# Patient Record
Sex: Male | Born: 1945 | Race: White | Hispanic: No | Marital: Married
Health system: Southern US, Academic
[De-identification: ages and names within clinical notes are randomized; demographics above are authoritative.]

## PROBLEM LIST (undated history)

## (undated) ENCOUNTER — Encounter

## (undated) ENCOUNTER — Encounter: Attending: Hematology & Oncology | Primary: Hematology & Oncology

## (undated) ENCOUNTER — Telehealth

## (undated) ENCOUNTER — Telehealth: Attending: Oncology | Primary: Oncology

## (undated) ENCOUNTER — Ambulatory Visit

## (undated) ENCOUNTER — Ambulatory Visit: Payer: PRIVATE HEALTH INSURANCE

## (undated) ENCOUNTER — Non-Acute Institutional Stay: Payer: PRIVATE HEALTH INSURANCE

## (undated) ENCOUNTER — Encounter: Attending: Adult Health | Primary: Adult Health

## (undated) DIAGNOSIS — E785 Hyperlipidemia, unspecified: Secondary | ICD-10-CM

## (undated) DIAGNOSIS — Z87442 Personal history of urinary calculi: Secondary | ICD-10-CM

## (undated) DIAGNOSIS — I10 Essential (primary) hypertension: Secondary | ICD-10-CM

## (undated) DIAGNOSIS — K56609 Unspecified intestinal obstruction, unspecified as to partial versus complete obstruction: Secondary | ICD-10-CM

## (undated) DIAGNOSIS — H269 Unspecified cataract: Secondary | ICD-10-CM

## (undated) DIAGNOSIS — I2510 Atherosclerotic heart disease of native coronary artery without angina pectoris: Secondary | ICD-10-CM

## (undated) HISTORY — PX: CARDIAC CATHETERIZATION: SHX172

## (undated) HISTORY — PX: KIDNEY STONE SURGERY: SHX686

## (undated) HISTORY — DX: Atherosclerotic heart disease of native coronary artery without angina pectoris: I25.10

---
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
## 2001-08-30 ENCOUNTER — Inpatient Hospital Stay (HOSPITAL_COMMUNITY): Admission: EM | Admit: 2001-08-30 | Discharge: 2001-09-01 | Payer: Self-pay | Admitting: Cardiovascular Disease

## 2001-09-01 ENCOUNTER — Encounter: Payer: Self-pay | Admitting: Cardiovascular Disease

## 2002-03-30 ENCOUNTER — Encounter: Payer: Self-pay | Admitting: Family Medicine

## 2002-03-30 ENCOUNTER — Ambulatory Visit (HOSPITAL_COMMUNITY): Admission: RE | Admit: 2002-03-30 | Discharge: 2002-03-30 | Payer: Self-pay | Admitting: Family Medicine

## 2006-09-08 ENCOUNTER — Ambulatory Visit (HOSPITAL_COMMUNITY): Admission: RE | Admit: 2006-09-08 | Discharge: 2006-09-08 | Payer: Self-pay | Admitting: Urology

## 2006-09-14 ENCOUNTER — Ambulatory Visit (HOSPITAL_COMMUNITY): Admission: RE | Admit: 2006-09-14 | Discharge: 2006-09-14 | Payer: Self-pay | Admitting: Urology

## 2006-10-06 ENCOUNTER — Ambulatory Visit (HOSPITAL_COMMUNITY): Admission: RE | Admit: 2006-10-06 | Discharge: 2006-10-06 | Payer: Self-pay | Admitting: Urology

## 2006-10-19 ENCOUNTER — Ambulatory Visit (HOSPITAL_COMMUNITY): Admission: RE | Admit: 2006-10-19 | Discharge: 2006-10-19 | Payer: Self-pay | Admitting: Urology

## 2008-09-24 IMAGING — CR DG ABDOMEN 1V
1 series · 1 of 1 positions shown · non-contrast
Comparison: none

CLINICAL DATA: Right renal calculus.  Pain.  
 ABDOMEN - 1 VIEW:

[view not recorded]
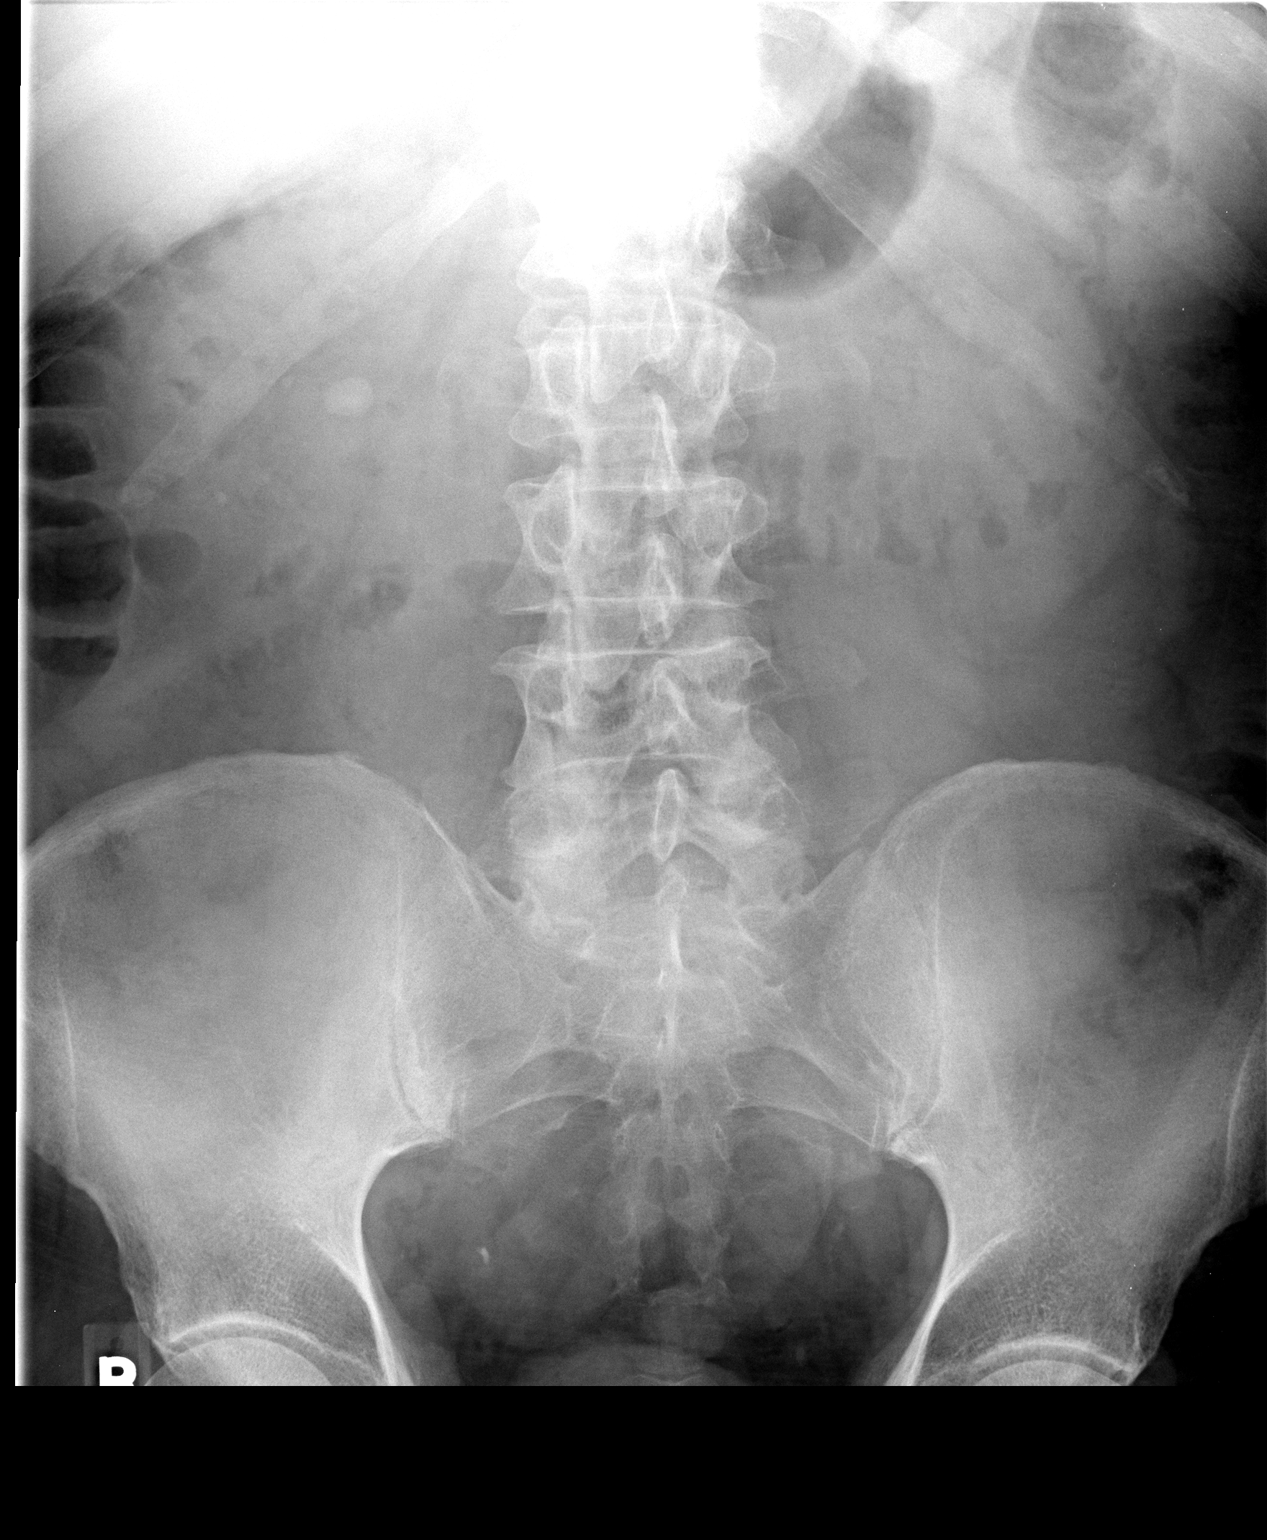

[1 of 1 positions shown; findings below may reference images not displayed]

FINDINGS: Approximately 12 x 14 mm calculus projecting in the region of medial aspect of the right kidney.  It may lie within the renal pelvis.  There appear to be a couple of smaller, approximately 2 x 3 mm calculi in the mid to inferior aspect of the right kidney.  No definite calculi along the distribution of the ureters.  Unremarkable bowel gas pattern.
IMPRESSION: Right renal calculi with the largest stone noted in the right renal hilar region.

## 2009-02-18 ENCOUNTER — Encounter: Payer: Self-pay | Admitting: Cardiology

## 2009-10-16 DIAGNOSIS — I2510 Atherosclerotic heart disease of native coronary artery without angina pectoris: Secondary | ICD-10-CM

## 2009-10-16 HISTORY — DX: Atherosclerotic heart disease of native coronary artery without angina pectoris: I25.10

## 2009-11-02 ENCOUNTER — Encounter: Payer: Self-pay | Admitting: Cardiology

## 2009-11-03 ENCOUNTER — Encounter: Payer: Self-pay | Admitting: Cardiology

## 2009-11-03 ENCOUNTER — Ambulatory Visit: Payer: Self-pay | Admitting: Internal Medicine

## 2009-11-03 ENCOUNTER — Inpatient Hospital Stay (HOSPITAL_COMMUNITY): Admission: EM | Admit: 2009-11-03 | Discharge: 2009-11-06 | Payer: Self-pay | Admitting: *Deleted

## 2009-11-06 ENCOUNTER — Encounter: Payer: Self-pay | Admitting: Cardiology

## 2009-11-25 ENCOUNTER — Ambulatory Visit: Payer: Self-pay | Admitting: Cardiology

## 2009-11-25 DIAGNOSIS — I2510 Atherosclerotic heart disease of native coronary artery without angina pectoris: Secondary | ICD-10-CM | POA: Insufficient documentation

## 2009-11-25 DIAGNOSIS — E785 Hyperlipidemia, unspecified: Secondary | ICD-10-CM

## 2009-11-25 DIAGNOSIS — E669 Obesity, unspecified: Secondary | ICD-10-CM | POA: Insufficient documentation

## 2009-11-29 ENCOUNTER — Telehealth (INDEPENDENT_AMBULATORY_CARE_PROVIDER_SITE_OTHER): Payer: Self-pay | Admitting: *Deleted

## 2009-12-06 ENCOUNTER — Telehealth (INDEPENDENT_AMBULATORY_CARE_PROVIDER_SITE_OTHER): Payer: Self-pay | Admitting: *Deleted

## 2009-12-13 ENCOUNTER — Telehealth (INDEPENDENT_AMBULATORY_CARE_PROVIDER_SITE_OTHER): Payer: Self-pay | Admitting: *Deleted

## 2009-12-13 ENCOUNTER — Encounter: Payer: Self-pay | Admitting: Cardiology

## 2009-12-18 ENCOUNTER — Encounter (INDEPENDENT_AMBULATORY_CARE_PROVIDER_SITE_OTHER): Payer: Self-pay | Admitting: *Deleted

## 2009-12-24 ENCOUNTER — Ambulatory Visit: Payer: Self-pay | Admitting: Cardiology

## 2009-12-24 ENCOUNTER — Encounter: Payer: Self-pay | Admitting: Physician Assistant

## 2010-01-13 ENCOUNTER — Encounter: Payer: Self-pay | Admitting: Cardiology

## 2010-01-20 ENCOUNTER — Encounter: Payer: Self-pay | Admitting: Cardiology

## 2010-02-24 ENCOUNTER — Encounter: Payer: Self-pay | Admitting: Cardiology

## 2010-03-03 ENCOUNTER — Encounter: Payer: Self-pay | Admitting: Cardiology

## 2010-05-16 ENCOUNTER — Ambulatory Visit: Payer: Self-pay | Admitting: Cardiology

## 2010-05-16 DIAGNOSIS — I714 Abdominal aortic aneurysm, without rupture: Secondary | ICD-10-CM | POA: Insufficient documentation

## 2010-11-16 HISTORY — PX: TOTAL KNEE ARTHROPLASTY: SHX125

## 2010-12-03 ENCOUNTER — Encounter: Payer: Self-pay | Admitting: Cardiology

## 2010-12-16 NOTE — Progress Notes (Signed)
Summary: LOW HR  Phone Note Call from Patient Call back at Home Phone [phone] Call back at [phone]   Caller: Spouse Summary of Call: HR 40 per BP machine. Patient denies being dizzy,SOB,lightheadedness,CP. Please advise. Initial call taken by: Carlye Grippe,  December 06, 2009 8:06 AM  Follow-up for Phone Call        please contact pt's wife and have her repeat HR, but this time document the BP, as well. And, is pt having symptoms with these vitals. Follow-up by: Nelida Meuse, PA-C,  December 06, 2009 2:22 PM  Additional Follow-up for Phone Call Additional follow up Details #1::        left message on voicemail to call office with the above info, home line busy times two. Additional Follow-up by: Carlye Grippe,  December 06, 2009 4:42 PM    Additional Follow-up for Phone Call Additional follow up Details #2::    BP- 110/47 HR-48. Patient c/o a little tired, SOB that said he's had the entire time. Follow-up by: Carlye Grippe,  December 09, 2009 11:18 AM  Additional Follow-up for Phone Call Additional follow up Details #3:: Details for Additional Follow-up Action Taken: instruct pt to decrease metoprolol to 12.5 mg daily (x1 week), then D/C altogether. check BP/HR afterwards, and contact our office with results/symptoms. will need to reassess whether or not pt can tolerate B blocker in future, at time of next OV.  12/10/09 @12 :48pm Patient's wife informed of the above.  Additional Follow-up by: Nelida Meuse, PA-C,  December 09, 2009 2:35 PM  New/Updated Medications: METOPROLOL TARTRATE 25 MG TABS (METOPROLOL TARTRATE) take 1/2 tab by mouth daily times 7days then stop

## 2010-12-16 NOTE — Progress Notes (Signed)
Summary: BP/HR questions  Phone Note Call from Patient Call back at Home Phone [phone]   Summary of Call: Pt's wife left message on voicemail stating pt was seen on Monday and she has some questions about his blood pressure and heart rate.   Attempted to reach pt but no answer. Left message to call back on machine. Initial call taken by: Cyril Loosen, RN, BSN,  November 29, 2009 3:53 PM  Follow-up for Phone Call        States one day last week got dizzy and BP got real low. His BP was 107/50 and HR was 54. The next time they checked it BP was 118/54 HR was 45. This am it was 125/69 HR 59. He thinks it has stabalized now, though. They will continue to monitor BP/HR and notify if dizziness returns or further problems. Follow-up by: Cyril Loosen, RN, BSN,  December 02, 2009 9:06 AM

## 2010-12-16 NOTE — Progress Notes (Signed)
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 NP stated that he had to have a stress test and that the echo is waved since he had the cath. NP request that we order the test.  Initial call taken by: Carlye Grippe,  December 13, 2009 1:21 PM  Follow-up for Phone Call        OK to order ETT. Follow-up by: Rollene Rotunda, MD, [facility],  December 16, 2009 1:52 PM

## 2010-12-16 NOTE — Assessment & Plan Note (Signed)
Summary: 6 mo fu per july reminder-srs   Visit Type:  Follow-up Primary Provider:  Dr. Reuel Boom  CC:  CAD.  History of Present Illness: The patient returns for followup of known coronary disease. Since I last saw him he has had no new cardiovascular problems. He works in his yard without limitations. He has had no chest pressure, neck or arm discomfort. He has had no palpitations, presyncope or syncope. He may get short of breath and very hot weather. However, he is not describing any resting shortness of breath, PND or orthopnea.  Preventive Screening-Counseling & Management  Alcohol-Tobacco     Smoking Status: quit     Year Quit: 2000  Current Medications (verified): 1)  Fish Oil 1000 Mg Caps (Omega-3 Fatty Acids) .... Take 1 Tablet By Mouth Once A Day 2)  Coq10 50 Mg Caps (Coenzyme Q10) .... Take 1 Tablet By Mouth Once A Day 3)  K-99 595 Mg Caps (Potassium Gluconate) .... Take 1 Tablet By Mouth Once A Day 4)  Lisinopril 10 Mg Tabs (Lisinopril) .... Take 1 Tablet By Mouth Once A Day 5)  Plavix 75 Mg Tabs (Clopidogrel Bisulfate) .... Take 1 Tablet By Mouth Once A Day 6)  Crestor 20 Mg Tabs (Rosuvastatin Calcium) .... Take 1 Tablet By Mouth Once A Day 7)  Nitrostat 0.4 Mg Subl (Nitroglycerin) .Marland Kitchen.. 1 Tablet Under Tongue At Onset of Chest Pain; You May Repeat Every 5 Minutes For Up To 3 Doses. 8)  Glucosamine Hcl 1000 Mg Tabs (Glucosamine Hcl) .... Take 2 Tablet By Mouth Once A Day 9)  Omeprazole 20 Mg Cpdr (Omeprazole) .... Take 1 Tablet By Mouth Once A Day 10)  Claritin 10 Mg Tabs (Loratadine) .... Take 1 Tablet By Mouth Once A Day 11)  Ibuprofen 200 Mg Tabs (Ibuprofen) .... As Needed  Allergies (verified): 1)  ! Iodine  Comments:  Nurse/Medical Assistant: The patient's medication bottles and allergies were reviewed with the patient and were updated in the Medication and Allergy Lists.  Past History:  Past Medical History: Reviewed history from 11/25/2009 and no changes  required. 1. CAD (NQWMI 90% stenosis in the right coronary artery and otherwise nonobstructive disease.  EF     55%.  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  The RCA was stented with a 4.5 x 16 mm VeriFLEX bare-    metal stent.) 2. Paroxysmal atrial fibrillation post percutaneous coronary     intervention. 3. Hypertension. 4. Hyperlipidemia. 5. History of bilateral renal calculi, status post extracorporeal     shockwave lithotripsy and lithotomy. 6. History abdominal aortic aneurysm measuring 4.4 x 5 cm. 7. Remote tobacco abuse. 8. History of IV CONTRAST allergy. 9. Renal insufficiency, likely related to contrast use this admission.  Past Surgical History: Kidney stones resected/lithotripsy  Review of Systems       As stated in the HPI and negative for all other systems.   Vital Signs:  Patient profile:   65 year old male Height:      73 inches Weight:      226 pounds O2 Sat:      97 % on Room air Pulse rate:   67 / minute BP sitting:   128 / 70  (left arm) Cuff size:   large  Vitals Entered By: Carlye Grippe (May 16, 2010 3:41 PM)  O2 Flow:  Room air  Physical Exam  General:  Well developed, well nourished,  in no acute distress. Head:  normocephalic and atraumatic Neck:  Neck supple, no JVD. No masses, thyromegaly or abnormal cervical nodes. Chest Wall:  no deformities or breast masses noted Lungs:  Clear bilaterally to auscultation and percussion. Heart:  Non-displaced PMI, chest non-tender; regular rate and rhythm, S1, S2 without murmurs, rubs or gallops. Carotid upstroke normal, no bruit. Normal abdominal aortic size, no bruits. Femorals normal pulses, no bruits. Pedals normal pulses. No edema, no varicosities. Abdomen:  Bowel sounds positive; abdomen soft and non-tender without masses, organomegaly, or hernias noted. No hepatosplenomegaly. Msk:  Back normal, normal gait. Muscle strength and tone  normal. Extremities:  No clubbing or cyanosis. Neurologic:  Alert and oriented x 3. Skin:  Intact without lesions or rashes. Cervical Nodes:  no significant adenopathy Inguinal Nodes:  no significant adenopathy Psych:  Normal affect.   Impression & Recommendations:  Problem # 1:  CORONARY ATHEROSCLEROSIS NATIVE CORONARY ARTERY (ICD-414.01) He is having no new symptoms and will continue with risk reduction.  Problem # 2:  ABDOMINAL AORTIC ANEURYSM (ICD-441.4) He had an abdominal aortic aneurysm checked recently and measuring 3.1 x 3.1. We will follow this up in one year.  Problem # 3:  HYPERLIPIDEMIA (ICD-272.4) he had lipids done in April and I reviewed these. His HDL was 35 and we discussed increase physical activity to treat this. The goal is an HDL greater than 40.  Patient Instructions: 1)  Your physician wants you to follow-up in: 1 year. You will receive a reminder letter in the mail one-two months in advance. If you don't receive a letter, please call our office to schedule the follow-up appointment. 2)  Your physician recommends that you continue on your current medications as directed. Please refer to the Current Medication list given to you today.

## 2010-12-16 NOTE — Letter (Signed)
Summary: DOT Request  DOT Request   Imported By: Cyril Loosen, RN, BSN 12/13/2009 14:58:07  _____________________________________________________________________  External Attachment:    Type:   Image     Comment:   External Document

## 2010-12-16 NOTE — Assessment & Plan Note (Signed)
Summary: new Hosp fu -d/c [facility] 12-22   Visit Type:  Follow-up Primary Provider:  Dr. Reuel Boom  CC:  CAD.  History of Present Illness: The patient presents for follow up after a non Q wave myocardial infarction.  Preventive Screening-Counseling & Management  Alcohol-Tobacco     Smoking Status: quit     Year Quit: 2000  Current Medications (verified): 1)  Fish Oil 1000 Mg Caps (Omega-3 Fatty Acids) .... Take 1 Tablet By Mouth Once A Day 2)  Coq10 100 Mg Caps (Coenzyme Q10) .... Take 1 Tablet By Mouth Once A Day 3)  K-99 595 Mg Caps (Potassium Gluconate) .... Take 1 Tablet By Mouth Once A Day 4)  Lisinopril 10 Mg Tabs (Lisinopril) .... Take 1 Tablet By Mouth Once A Day 5)  Plavix 75 Mg Tabs (Clopidogrel Bisulfate) .... Take 1 Tablet By Mouth Once A Day 6)  Metoprolol Tartrate 25 Mg Tabs (Metoprolol Tartrate) .... Take 1/2 Tab Two Times A Day 7)  Crestor 40 Mg Tabs (Rosuvastatin Calcium) .... Take 1 Tablet By Mouth Once A Day 8)  Nitrostat 0.4 Mg Subl (Nitroglycerin) .Marland Kitchen.. 1 Tablet Under Tongue At Onset of Chest Pain; You May Repeat Every 5 Minutes For Up To 3 Doses. 9)  Cefprozil 250 Mg Tabs (Cefprozil) .... Take 1 Tablet By Mouth Two Times A Day 10)  Benzonatate 100 Mg Caps (Benzonatate) .... As Needed 11)  Osteo Bi-Flex Triple Strength  Tabs (Misc Natural Products) .... Take 1 Tablet By Mouth Once A Day  Allergies (verified): 1)  ! Iodine  Past History:  Past Medical History: 1. CAD (NQWMI 90% stenosis in the right coronary artery and otherwise nonobstructive disease.  EF     55%.  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  The RCA was stented with a 4.5 x 16 mm VeriFLEX bare-    metal stent.) 2. Paroxysmal atrial fibrillation post percutaneous coronary     intervention. 3. Hypertension. 4. Hyperlipidemia. 5. History of bilateral renal calculi, status post extracorporeal     shockwave lithotripsy and lithotomy. 6. History  abdominal aortic aneurysm measuring 4.4 x 5 cm. 7. Remote tobacco abuse. 8. History of IV CONTRAST allergy. 9. Renal insufficiency, likely related to contrast use this admission.  Social History: Smoking Status:  quit  Review of Systems       As stated in the HPI and negative for all other systems.   Vital Signs:  Patient profile:   65 year old male Height:      73 inches Weight:      235.8 pounds BMI:     31.22 Pulse rate:   50 / minute BP sitting:   117 / 67  (left arm) Cuff size:   regular  Vitals Entered By: Dylan Brunette, LPN (November 25, 2009 2:59 PM)  Nutrition Counseling: Patient's BMI is greater than 25 and therefore counseled on weight management options. CC: CAD Is Patient Diabetic? No Comments post hosp.   Physical Exam  General:  Well developed, well nourished, in no acute distress. Head:  normocephalic and atraumatic Eyes:  PERRLA/EOM intact; conjunctiva and lids normal. Mouth:  Teeth, gums and palate normal. Oral mucosa normal. Neck:  Neck supple, no JVD. No masses, thyromegaly or abnormal cervical nodes. Chest Wall:  no deformities or breast masses noted Heart:  Non-displaced PMI, chest non-tender; regular rate and rhythm, S1, S2 without murmurs, rubs or gallops. Carotid upstroke normal, no bruit.  Normal abdominal aortic size, no bruits. Femorals normal pulses, no bruits. Pedals normal pulses. No edema, no varicosities. Abdomen:  Bowel sounds positive; abdomen soft and non-tender without masses, organomegaly, or hernias noted. No hepatosplenomegaly. Msk:  Back normal, normal gait. Muscle strength and tone normal. Extremities:  No clubbing or cyanosis. Neurologic:  Alert and oriented x 3. Skin:  Intact without lesions or rashes. Cervical Nodes:  no significant adenopathy Axillary Nodes:  no significant adenopathy Inguinal Nodes:  no significant adenopathy Psych:  Normal affect.   Impression & Recommendations:  Problem # 1:  CORONARY ATHEROSCLEROSIS  NATIVE CORONARY ARTERY (ICD-414.01) The the patient is having no new symptoms. No further cardiovascular testing is suggested. He will continue with risk reduction. Future Orders: T-Lipid Profile [id number]) ... 01/13/2010  Problem # 2:  HYPERLIPIDEMIA (ICD-272.4) I reviewed the lipids from the hospital. His LDL was slightly elevated. He is HDL was slightly low. He was sent home from the hospital on 40 mg of Crestor. In 6 weeks he will need a lipid profile and liver enzymes. I may be able to reduce his dose of Crestor based on these readings. Future Orders: T-Lipid Profile [id number]) ... 01/13/2010  Problem # 3:  OBESITY, UNSPECIFIED (ICD-278.00) He understands the need to lose weight with diet and exercise.  Patient Instructions: 1)  Your physician wants you to follow-up in: 6 MONTHS. You will receive a reminder letter in the mail about two months in advance. If you don't receive a letter, please call our office to schedule the follow-up appointment. 2)  Your physician recommends that you return for a FASTING lipid profile: in 6weeks at the [facility]. 3)  Your physician recommends that you continue on your current medications as directed. Please refer to the Current Medication list given to you today.   Orders Added: 1)  T-Lipid Profile [[id number]]

## 2010-12-16 NOTE — Miscellaneous (Signed)
Summary: LIPID/LIVER PROFILE  Clinical Lists Changes  Orders: Added new Test order of T-Hepatic Function [id number]) - Signed Added new Test order of T-Lipid Profile [id number]) - Signed

## 2010-12-16 NOTE — Letter (Signed)
Summary: Graded Exercise Tolerance Test  [facility] at [facility] [address]   [place], [place] [address]   Phone: [phone]  Fax: [phone]      [facility] Cardiovascular Services  Graded Exercise Tolerance Test    Dylan [name]  Appointment Date:_  Appointment Time:_   Your doctor has ordered a stress test to help determine the condition of your  heart during exercise. If you take blood pressure medicine , ask your doctor if you should take it the day of your test. You may eat a light meal before your test.  Please be sure to bring the copy of your order with you.   You should dress comfortably, for example: Sweat pants, shorts, or skirt, Loose                                                                                                       short sleeved T-shirt, Rubber soled lace-up shoes (tennis shoes)  You will need to arrive 15 minutes before your appointment time. You will also need to enter at the Main Entrance of the hospital and go to the registration desk. They will direct you to the Cardiovascular Department on the third floor.  You will need to plan on being at the hospital for one hour from registration for this appointment.

## 2010-12-18 NOTE — Letter (Signed)
Summary: Letter/ SURGICAL CLEARANCE [facility]  Letter/ SURGICAL CLEARANCE [facility]   Imported By: Dorise Hiss 12/03/2010 14:22:37  _____________________________________________________________________  External Attachment:    Type:   Image     Comment:   External Document  Appended Document: Letter/ SURGICAL CLEARANCE [facility] We will have him come back for a surgical clearance appt.  Appended Document: Letter/ SURGICAL CLEARANCE  [facility] Pt has pending appt for January 12, 2011 for surgical clearance. Surgery is scheduled for April 13. 2012.

## 2011-01-12 ENCOUNTER — Encounter: Payer: Self-pay | Admitting: Cardiology

## 2011-01-12 ENCOUNTER — Ambulatory Visit (INDEPENDENT_AMBULATORY_CARE_PROVIDER_SITE_OTHER): Payer: MEDICARE | Admitting: Cardiology

## 2011-01-12 DIAGNOSIS — I2510 Atherosclerotic heart disease of native coronary artery without angina pectoris: Secondary | ICD-10-CM

## 2011-01-22 NOTE — Assessment & Plan Note (Signed)
Summary: PAT NEEDS SURGICAL CLEARANCE FOR KNEE SURGERY, SCHE IN APRIL ...   Visit Type:  Follow-up Referring Provider:  Dr. Ollen Gross Primary Provider:  Dr. Reuel Boom  CC:  CAD.  History of Present Illness: The patient presents for routine followup. Since I last saw him he has had no new cardiovascular complaints. He is due to have knee replacement and is here for preoperative examination. He had his stent in December 2010. He had a stress echocardiogram in February 2011. He only got to 83% of his age-predicted maximum heart rate but there were no abnormalities on this study. He has since had none of the symptoms that prompted his catheterization. He still remains relatively active despite his needs. (Greater than 5 METS).  He has had chest pressure, neck or arm discomfort. There are no new palpitations, presyncope or syncope. He has had no weight gain or edema.  Preventive Screening-Counseling & Management  Alcohol-Tobacco     Smoking Status: quit     Year Quit: 10-12 yr ago  Current Medications (verified): 1)  Fish Oil 1000 Mg Caps (Omega-3 Fatty Acids) .... Take 1 Tablet By Mouth Once A Day 2)  Lisinopril 10 Mg Tabs (Lisinopril) .... Take 1 Tablet By Mouth Once A Day 3)  Plavix 75 Mg Tabs (Clopidogrel Bisulfate) .... Take 1 Tablet By Mouth Once A Day 4)  Crestor 20 Mg Tabs (Rosuvastatin Calcium) .... Take 1 Tablet By Mouth Once A Day 5)  Nitrostat 0.4 Mg Subl (Nitroglycerin) .Marland Kitchen.. 1 Tablet Under Tongue At Onset of Chest Pain; You May Repeat Every 5 Minutes For Up To 3 Doses. 6)  Glucosamine Hcl 1000 Mg Tabs (Glucosamine Hcl) .... Take 2 Tablet By Mouth Once A Day 7)  Ibuprofen 200 Mg Tabs (Ibuprofen) .... As Needed 8)  Aspir-Low 81 Mg Tbec (Aspirin) .... Take 1 Tablet By Mouth Once A Day  Allergies (verified): 1)  ! Iodine  Past History:  Past Medical History: 1. CAD (NQWMI 90% stenosis in the right coronary artery and otherwise nonobstructive disease.  EF     55%.  The left  main was anomalous from the right coronary cusp which was     previously known.  The RCA was stented with a 4.5 x 16 mm VeriFLEX bare-    metal stent. 12/10) 2. Paroxysmal atrial fibrillation post percutaneous coronary     intervention. 3. Hypertension. 4. Hyperlipidemia. 5. History of bilateral renal calculi, status post extracorporeal     shockwave lithotripsy and lithotomy. 6. History abdominal aortic aneurysm measuring 4.4 x 5 cm. 7. Remote tobacco abuse. 8. History of IV CONTRAST allergy. 9. Renal insufficiency, likely related to contrast use this admission.  Past Surgical History: Reviewed history from 05/16/2010 and no changes required. Kidney stones resected/lithotripsy  Review of Systems       As stated in the HPI and negative for all other systems.   Vital Signs:  Patient profile:   65 year old male Weight:      233.25 pounds BMI:     30.88 Pulse rate:   61 / minute BP sitting:   152 / 77  (right arm) Cuff size:   regular  Vitals Entered By: Hoover Brunette, LPN (January 12, 2011 11:01 AM) CC: CAD Is Patient Diabetic? No Comments cardiac clearance for left knee replacement scheduled for 4/13   Physical Exam  General:  Well developed, well nourished, in no acute distress. Head:  normocephalic and atraumatic Eyes:  PERRLA/EOM intact; conjunctiva and lids normal. Mouth:  Teeth, gums and palate normal. Oral mucosa normal. Neck:  Neck supple, no JVD. No masses, thyromegaly or abnormal cervical nodes. Chest Wall:  no deformities or breast masses noted Lungs:  Clear bilaterally to auscultation and percussion. Abdomen:  Bowel sounds positive; abdomen soft and non-tender without masses, organomegaly, or hernias noted. No hepatosplenomegaly. Msk:  Back normal, normal gait. Muscle strength and tone normal. Extremities:  No clubbing or cyanosis. Neurologic:  Alert and oriented x 3. Skin:  Intact without lesions or rashes. Cervical Nodes:  no significant  adenopathy Inguinal Nodes:  no significant adenopathy Psych:  Normal affect.   Detailed Cardiovascular Exam  Neck    Carotids: Carotids full and equal bilaterally without bruits.      Neck Veins: Normal, no JVD.    Heart    Inspection: no deformities or lifts noted.      Palpation: normal PMI with no thrills palpable.      Auscultation: regular rate and rhythm, S1, S2 without murmurs, rubs, gallops, or clicks.    Vascular    Abdominal Aorta: no palpable masses, pulsations, or audible bruits.      Femoral Pulses: normal femoral pulses bilaterally.      Pedal Pulses: normal pedal pulses bilaterally.      Radial Pulses: normal radial pulses bilaterally.      Peripheral Circulation: no clubbing, cyanosis, or edema noted with normal capillary refill.     EKG  Procedure date:  01/12/2011  Findings:      sinus bradycardia, rate 53, axis within normal, intervals within normal limits, no acute ST-T wave changes.  Impression & Recommendations:  Problem # 1:  PREOPERATIVE EXAMINATION (ICD-V72.84) According to the ACC/AHA guidelines the patient is at acceptable risk for the planned surgery. He can discontinue his Plavix prior to the surgery and hold his visual. I would've preferred that he continue aspirin in less this is absolutely contraindicated or ecchymosis there is a strong relative contraindication from a surgical standpoint. I have asked him to discuss this with Dr. Lequita Halt.  Problem # 2:  CORONARY ATHEROSCLEROSIS NATIVE CORONARY ARTERY (ICD-414.01) He has had no new symptoms since his stress test.  He will continue with the meds as listed. Orders: EKG w/ Interpretation ([id number])  Problem # 3:  ABDOMINAL AORTIC ANEURYSM (ICD-441.4) This is followed by his primary MD.    Problem # 4:  HYPERLIPIDEMIA (ICD-272.4) This is followed by his primary MD with a goal LDL less than 100 and HDL greater than 40.  Patient Instructions: 1)  Your physician wants you to follow-up in: 1 year.  You will receive a reminder letter in the mail one-two months in advance. If you don't receive a letter, please call our office to schedule the follow-up appointment. 2)  Your physician recommends that you continue on your current medications as directed. Please refer to the Current Medication list given to you today.

## 2011-01-22 NOTE — Medication Information (Signed)
Summary: [facility] D/C MEDICATION SHEET ORDER  [facility] D/C MEDICATION SHEET ORDER   Imported By: Zachary George 01/12/2011 08:56:57  _____________________________________________________________________  External Attachment:    Type:   Image     Comment:   External Document

## 2011-02-16 LAB — CBC
HCT: 34.9 % — ABNORMAL LOW (ref 39.0–52.0)
HCT: 35.8 % — ABNORMAL LOW (ref 39.0–52.0)
Hemoglobin: 12.4 g/dL — ABNORMAL LOW (ref 13.0–17.0)
Hemoglobin: 12.6 g/dL — ABNORMAL LOW (ref 13.0–17.0)
Hemoglobin: 14.2 g/dL (ref 13.0–17.0)
MCHC: 34.2 g/dL (ref 30.0–36.0)
MCHC: 34.7 g/dL (ref 30.0–36.0)
MCHC: 35.6 g/dL (ref 30.0–36.0)
MCV: 96.2 fL (ref 78.0–100.0)
MCV: 96.9 fL (ref 78.0–100.0)
Platelets: 189 10*3/uL (ref 150–400)
RBC: 3.72 MIL/uL — ABNORMAL LOW (ref 4.22–5.81)
RBC: 4.22 MIL/uL (ref 4.22–5.81)
RDW: 11.9 % (ref 11.5–15.5)
WBC: 14.6 10*3/uL — ABNORMAL HIGH (ref 4.0–10.5)
WBC: 19.7 10*3/uL — ABNORMAL HIGH (ref 4.0–10.5)
WBC: 8.1 10*3/uL (ref 4.0–10.5)

## 2011-02-16 LAB — BASIC METABOLIC PANEL
BUN: 30 mg/dL — ABNORMAL HIGH (ref 6–23)
CO2: 27 mEq/L (ref 19–32)
CO2: 27 mEq/L (ref 19–32)
Calcium: 9.2 mg/dL (ref 8.4–10.5)
Chloride: 108 mEq/L (ref 96–112)
GFR calc Af Amer: 52 mL/min — ABNORMAL LOW (ref >60)
GFR calc Af Amer: >60 mL/min (ref >60)
Glucose, Bld: 101 mg/dL — ABNORMAL HIGH (ref 70–99)
Potassium: 3.7 mEq/L (ref 3.5–5.1)
Potassium: 3.8 mEq/L (ref 3.5–5.1)
Sodium: 139 mEq/L (ref 135–145)
Sodium: 142 mEq/L (ref 135–145)

## 2011-02-16 LAB — COMPREHENSIVE METABOLIC PANEL
ALT: 23 U/L (ref 0–53)
AST: 56 U/L — ABNORMAL HIGH (ref 0–37)
Calcium: 8.8 mg/dL (ref 8.4–10.5)
GFR calc Af Amer: >60 mL/min (ref >60)
Sodium: 139 mEq/L (ref 135–145)
Total Protein: 6.3 g/dL (ref 6.0–8.3)

## 2011-02-16 LAB — CARDIAC PANEL(CRET KIN+CKTOT+MB+TROPI)
CK, MB: 37.3 ng/mL — ABNORMAL HIGH (ref 0.3–4.0)
CK, MB: 49.9 ng/mL — ABNORMAL HIGH (ref 0.3–4.0)
Total CK: 400 U/L — ABNORMAL HIGH (ref 7–232)
Total CK: 462 U/L — ABNORMAL HIGH (ref 7–232)
Total CK: 580 U/L — ABNORMAL HIGH (ref 7–232)
Troponin I: 10.19 ng/mL (ref 0.00–0.06)

## 2011-02-16 LAB — URINALYSIS, ROUTINE W REFLEX MICROSCOPIC
Bilirubin Urine: NEGATIVE
Glucose, UA: NEGATIVE mg/dL
Ketones, ur: NEGATIVE mg/dL
Specific Gravity, Urine: 1.019 (ref 1.005–1.030)
pH: 6.5 (ref 5.0–8.0)

## 2011-02-16 LAB — APTT: aPTT: 51 seconds — ABNORMAL HIGH (ref 24–37)

## 2011-02-16 LAB — LIPID PANEL
LDL Cholesterol: 95 mg/dL (ref 0–99)
Total CHOL/HDL Ratio: 4.8 RATIO
Triglycerides: 159 mg/dL — ABNORMAL HIGH (ref <150)
VLDL: 32 mg/dL (ref 0–40)

## 2011-02-16 LAB — DIFFERENTIAL
Basophils Relative: 1 % (ref 0–1)
Eosinophils Absolute: 0.6 10*3/uL (ref 0.0–0.7)
Lymphs Abs: 2.1 10*3/uL (ref 0.7–4.0)
Neutrophils Relative %: 60 % (ref 43–77)

## 2011-02-16 LAB — MAGNESIUM: Magnesium: 2 mg/dL (ref 1.5–2.5)

## 2011-02-16 LAB — HEPARIN LEVEL (UNFRACTIONATED)
Heparin Unfractionated: 0.25 IU/mL — ABNORMAL LOW (ref 0.30–0.70)
Heparin Unfractionated: 0.6 IU/mL (ref 0.30–0.70)

## 2011-02-18 ENCOUNTER — Other Ambulatory Visit: Payer: Self-pay | Admitting: Orthopedic Surgery

## 2011-02-18 ENCOUNTER — Ambulatory Visit (HOSPITAL_COMMUNITY)
Admission: RE | Admit: 2011-02-18 | Discharge: 2011-02-18 | Disposition: A | Discharge status: Home / Self Care | Payer: MEDICARE | Source: Ambulatory Visit | Attending: Orthopedic Surgery | Admitting: Orthopedic Surgery

## 2011-02-18 ENCOUNTER — Encounter (HOSPITAL_COMMUNITY): Payer: MEDICARE

## 2011-02-18 ENCOUNTER — Other Ambulatory Visit (HOSPITAL_COMMUNITY): Payer: Self-pay | Admitting: Orthopedic Surgery

## 2011-02-18 DIAGNOSIS — Z01818 Encounter for other preprocedural examination: Secondary | ICD-10-CM | POA: Insufficient documentation

## 2011-02-18 DIAGNOSIS — I519 Heart disease, unspecified: Secondary | ICD-10-CM | POA: Insufficient documentation

## 2011-02-18 DIAGNOSIS — Z01810 Encounter for preprocedural cardiovascular examination: Secondary | ICD-10-CM | POA: Insufficient documentation

## 2011-02-18 DIAGNOSIS — Z01812 Encounter for preprocedural laboratory examination: Secondary | ICD-10-CM | POA: Insufficient documentation

## 2011-02-18 LAB — COMPREHENSIVE METABOLIC PANEL
ALT: 20 U/L (ref 0–53)
AST: 23 U/L (ref 0–37)
Albumin: 3.9 g/dL (ref 3.5–5.2)
CO2: 29 mEq/L (ref 19–32)
Calcium: 9.1 mg/dL (ref 8.4–10.5)
Creatinine, Ser: 1.18 mg/dL (ref 0.4–1.5)
GFR calc Af Amer: >60 mL/min (ref >60)
Sodium: 142 mEq/L (ref 135–145)
Total Protein: 6.6 g/dL (ref 6.0–8.3)

## 2011-02-18 LAB — URINALYSIS, ROUTINE W REFLEX MICROSCOPIC
Glucose, UA: NEGATIVE mg/dL
Hgb urine dipstick: NEGATIVE
Protein, ur: NEGATIVE mg/dL
pH: 6 (ref 5.0–8.0)

## 2011-02-18 LAB — CBC
HCT: 40.5 % (ref 39.0–52.0)
Hemoglobin: 13.6 g/dL (ref 13.0–17.0)
MCHC: 33.6 g/dL (ref 30.0–36.0)
RBC: 4.29 MIL/uL (ref 4.22–5.81)

## 2011-02-18 LAB — APTT: aPTT: 30 seconds (ref 24–37)

## 2011-02-18 LAB — PROTIME-INR
INR: 1.03 (ref 0.00–1.49)
Prothrombin Time: 13.7 seconds (ref 11.6–15.2)

## 2011-02-27 ENCOUNTER — Inpatient Hospital Stay (HOSPITAL_COMMUNITY)
Admission: RE | Admit: 2011-02-27 | Discharge: 2011-03-02 | DRG: 470 | Disposition: A | Discharge status: Home Health | Payer: MEDICARE | Source: Ambulatory Visit | Attending: Orthopedic Surgery | Admitting: Orthopedic Surgery

## 2011-02-27 DIAGNOSIS — M1710 Unilateral primary osteoarthritis, unspecified knee: Principal | ICD-10-CM | POA: Diagnosis present

## 2011-02-27 DIAGNOSIS — Z87442 Personal history of urinary calculi: Secondary | ICD-10-CM

## 2011-02-27 DIAGNOSIS — I2510 Atherosclerotic heart disease of native coronary artery without angina pectoris: Secondary | ICD-10-CM | POA: Diagnosis present

## 2011-02-27 DIAGNOSIS — Z01812 Encounter for preprocedural laboratory examination: Secondary | ICD-10-CM

## 2011-02-27 DIAGNOSIS — I10 Essential (primary) hypertension: Secondary | ICD-10-CM | POA: Diagnosis present

## 2011-02-27 DIAGNOSIS — I252 Old myocardial infarction: Secondary | ICD-10-CM

## 2011-02-27 LAB — TYPE AND SCREEN
ABO/RH(D): B POS
Antibody Screen: NEGATIVE

## 2011-02-27 LAB — ABO/RH: ABO/RH(D): B POS

## 2011-02-28 LAB — BASIC METABOLIC PANEL WITH GFR
BUN: 14 mg/dL (ref 6–23)
CO2: 29 meq/L (ref 19–32)
Calcium: 8.3 mg/dL — ABNORMAL LOW (ref 8.4–10.5)
Chloride: 105 meq/L (ref 96–112)
Creatinine, Ser: 1.05 mg/dL (ref 0.4–1.5)
GFR calc non Af Amer: >60 mL/min
Glucose, Bld: 105 mg/dL — ABNORMAL HIGH (ref 70–99)
Potassium: 3.8 meq/L (ref 3.5–5.1)
Sodium: 138 meq/L (ref 135–145)

## 2011-02-28 LAB — CBC
HCT: 31 % — ABNORMAL LOW (ref 39.0–52.0)
Hemoglobin: 10.3 g/dL — ABNORMAL LOW (ref 13.0–17.0)
MCH: 31.4 pg (ref 26.0–34.0)
MCHC: 33.2 g/dL (ref 30.0–36.0)
MCV: 94.5 fL (ref 78.0–100.0)
Platelets: 166 10*3/uL (ref 150–400)
RBC: 3.28 MIL/uL — ABNORMAL LOW (ref 4.22–5.81)
RDW: 12.3 % (ref 11.5–15.5)
WBC: 9.2 10*3/uL (ref 4.0–10.5)

## 2011-03-01 LAB — BASIC METABOLIC PANEL
CO2: 30 mEq/L (ref 19–32)
Calcium: 8.5 mg/dL (ref 8.4–10.5)
Creatinine, Ser: 1.21 mg/dL (ref 0.4–1.5)
GFR calc Af Amer: >60 mL/min (ref >60)
Glucose, Bld: 128 mg/dL — ABNORMAL HIGH (ref 70–99)

## 2011-03-01 LAB — CBC
Hemoglobin: 9.7 g/dL — ABNORMAL LOW (ref 13.0–17.0)
MCH: 32 pg (ref 26.0–34.0)
MCHC: 33.9 g/dL (ref 30.0–36.0)

## 2011-03-02 LAB — CBC
MCH: 31.6 pg (ref 26.0–34.0)
MCHC: 33.6 g/dL (ref 30.0–36.0)
Platelets: 161 10*3/uL (ref 150–400)
RBC: 3.07 MIL/uL — ABNORMAL LOW (ref 4.22–5.81)
RDW: 12.5 % (ref 11.5–15.5)

## 2011-03-02 NOTE — H&P (Addendum)
NAME:  Dylan, [name] NO.:  [id number]  MEDICAL RECORD NO.:  [id number]           PATIENT TYPE:  I  LOCATION:  0006                         FACILITY:  [facility]  PHYSICIAN:  Ollen Gross, M.D.    DATE OF BIRTH:  02/10/1946  DATE OF ADMISSION:  02/27/2011 DATE OF DISCHARGE:                             HISTORY & PHYSICAL   CHIEF COMPLAINT:  Left knee pain.  BRIEF HISTORY:  Dylan [name] came in to see Dylan [name] back in January regarding his bilateral knee pain, worse on the left than the right.  He said he has had trouble with the left knee for many years, has gotten progressively worse over the last year.  He has had some trouble with the right knee, but the left is far worse.  He states that at this point, the pain and dysfunction are limiting what he is able to do and he now presents for a left total knee arthroplasty.  ALLERGIES:  Medication allergies are none.  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  PRIMARY CARE PHYSICIAN:  Donzetta Sprung.  CARDIOLOGIST:  Cecil Cranker, MD, [facility]  CURRENT MEDICATIONS: 1. Plavix. 2. Crestor. 3. Lisinopril. 4. Fish oil. 5. Glucosamine. 6. Aspirin. 7. Ibuprofen.  PAST MEDICAL HISTORY: 1. End-stage arthritis of the left knee. 2. Hypertension. 3. Coronary artery disease. 4. History of heart attack in December 2010. 5. History of kidney stones. 6. History of finger toes and nose fractures. 7. Arthritis.  PAST SURGICAL HISTORY:  Lithotripsy for kidney stones.  FAMILY HISTORY:  Father passed at the age of 51.  He had myocardial infarction.  Mother passed at the age of 78 from a car accident.  SOCIAL HISTORY:  The patient is married.  He is retired.  He admits to past use of tobacco products.  He does not use alcohol.  He states that he lives at home with his family.  He does plan to go home following his hospital stay.  REVIEW OF SYSTEMS:  GENERAL:  Negative for fevers, chills or weight change.   HEENT:  NEURO:  Negative for headache, blurred vision or double vision.  DERMATOLOGIC:  Negative for rash or lesion.  RESPIRATORY: Negative for shortness of breath at rest or with exertion. CARDIOVASCULAR:  Negative for chest pain.  Last EKG was in February 2012.  GI:  Negative for nausea, vomiting, diarrhea.  GU:  Negative for hematuria and dysuria.  MUSCULOSKELETAL:  Positive for joint pain.  PHYSICAL EXAMINATION:  VITAL SIGNS:  Pulse 76, respirations 18, blood pressure 138/70 in the left arm. GENERAL:  Dylan [name] is alert and oriented x3.  He is a pleasant 65 year old male.  He has a stated height of 61 inches and a stated weight of 230 pounds.  He is in no apparent distress. HEENT:  Normocephalic, atraumatic.  Extraocular movements intact.  The patient is wearing glasses. NECK:  Supple.  Full range of motion without lymphadenopathy. CHEST:  Lungs are clear to auscultation bilaterally without wheezes. HEART:  Regular rate and rhythm without murmur, S1 and S2 sounds are appreciated. ABDOMEN:  Bowel sounds present in  all 4 quadrants.  Abdomen is soft, nontender, nondistended. EXTREMITIES:  Left knee negative for effusion.  Range of motion is 10- 125 degrees.  There is marked crepitus throughout the range. SKIN:  Unremarkable. NEUROLOGIC:  Intact. PERIPHERAL VASCULAR:  Carotid pulses 2+ bilaterally without bruit.  RADIOGRAPHY:  AP and lateral views of the left knee reveal end-stage arthritis tricompartmentally.  IMPRESSION:  End-stage arthritis of the left knee.  PLAN:  Left total knee arthroplasty to be performed by Dylan [name].     Rozell Searing, PAC   ______________________________ Ollen Gross, M.D.    LD/MEDQ  D:  02/27/2011  T:  02/27/2011  Job:  [id number]  Electronically Signed by Rozell Searing  on 03/02/2011 09:52:32 AM Electronically Signed by Ollen Gross M.D. on 03/04/2011 09:53:00 AM

## 2011-03-04 NOTE — Op Note (Signed)
NAME:  Dylan [name], Dylan [name] NO.:  [id number]  MEDICAL RECORD NO.:  [id number]           PATIENT TYPE:  I  LOCATION:  0006                         FACILITY:  [facility]  PHYSICIAN:  Ollen Gross, M.D.    DATE OF BIRTH:  02-11-1946  DATE OF PROCEDURE: DATE OF DISCHARGE:                              OPERATIVE REPORT   PREOPERATIVE DIAGNOSIS:  Osteoarthritis, left knee.  POSTOPERATIVE DIAGNOSIS:  Osteoarthritis, left knee.  PROCEDURE:  Left total knee arthroplasty.  SURGEON:  Ollen Gross, M.D.  ASSISTANT:  Alexzandrew L. Perkins, P.A.C.  ANESTHESIA:  Spinal.  ESTIMATED BLOOD LOSS:  Minimal.  DRAINS:  Hemovac x1.  TOURNIQUET TIME:  37 minutes at 300 mmHg.  COMPLICATIONS:  None.  CONDITION:  Stable to recovery.  BRIEF CLINICAL NOTE:  Mr. Kutch is a 65 year old male with advanced end- stage arthritis of the left knee with progressively worsening pain and dysfunction.  He has failed nonoperative management and presents now for total knee arthroplasty.  PROCEDURE IN DETAIL:  After successful administration of spinal anesthetic, a tourniquet was placed high on the left thigh and left lower extremity prepped and draped in the usual sterile fashion. Extremity was wrapped in Esmarch, knee flexed, tourniquet inflated to 300 mmHg.  Midline incision was made with a #10 blade through subcutaneous tissue at the level of the extensor mechanism.  Fresh blade was used to make a medial parapatellar arthrotomy.  Soft tissue on the proximal medial tibia subperiosteally elevated to the joint line with the knife and into the semimembranosus bursa with a Cobb elevator.  Soft tissue laterally was elevated with attention being paid to avoid patellar tendon on tibial tubercle.  Patella was everted, knee flexed 90 degrees, and ACL and PCL removed.  Drill was used create a starting hole in the distal femur and the canal was thoroughly irrigated.  A 5 degrees of left valgus  alignment guide was placed and the distal femoral cutting block pinned to remove 11 mm off the distal femur.  Distal femoral resection was made with an oscillating saw.  Sizing guide was placed and size 5 was the most appropriate.  Rotation was marked at the epicondylar axis.  Size 5 cutting block was placed and the anterior-posterior chamfer cuts made.  The tibia subluxed forward and menisci removed.  Extramedullary tibial alignment guide was placed referencing proximally at the medial aspect of the tibial tubercle and distally along the second metatarsal axis and tibial crest.  Block was pinned to remove 2 mm off the more deficient medial side.  There was a significant medial defect.  The resection was little thicker than usual.  Resection was made with an oscillating saw. Osteophytes were removed.  Size 5 was the most appropriate tibial component.  The proximal tibia was prepared with modular drill and keel punch for the size 5.  Femoral preparation was completed with the intercondylar cut.  Size 5 mobile bearing tibial trial and size 5 posterior stabilized femoral trial and 12.5 mm posterior stabilized rotating platform insert trial were placed.  With the 12.5, there was a little bit of varus- valgus; placed with the  15 which allowed for full extension with excellent varus-valgus and anterior-posterior balance throughout full range of motion.  Patella was everted, thickness measured to be 27 mm. Freehand resection was taken to 15 mm, 41 template was placed, lug holes were drilled, trial patella was placed and it tracked normally. Osteophytes were removed off the posterior femur with the trial in place.  All trials were removed and the cut bone surfaces were prepared with pulsatile lavage.  Cement was mixed and once ready for implantation, a size 5 mobile bearing tibial tray, size 5 posterior stabilized femur and 41 patella were cemented into place and patella was held with a clamp.   Trial 15-mm inserts were placed, knee held in full extension and all extruded cement removed.  When the cement fully hardened, then the permanent 15-mm posterior stabilized rotating platform insert was placed in the tibial tray.  The wound was copiously irrigated with saline solution and the arthrotomy closed over Hemovac drain with interrupted #1 PDS.  Flexion against gravity to 135 degrees. The patella tracked normally.  Tourniquet was released after total time of 37 minutes.  Subcu was then closed with interrupted 2-0 Vicryl, subcuticular with running 4-0 Monocryl.  Catheter for Marcaine pain pump was placed and pump was initiated.  Drain was hooked to suction. Incisions cleaned and dried, and Steri-Strips and bulky sterile dressing applied.  He was then placed into a knee immobilizer, awakened and transported to recovery in stable condition.     Ollen Gross, M.D.     FA/MEDQ  D:  02/27/2011  T:  02/27/2011  Job:  [id number]  Electronically Signed by Ollen Gross M.D. on 03/04/2011 09:53:02 AM

## 2011-04-03 NOTE — H&P (Signed)
NAME:  Dylan [name], Dylan [name]               ACCOUNT NO.:  [id number]   MEDICAL RECORD NO.:  [id number]          PATIENT TYPE:  AMB   LOCATION:                                FACILITY:  [facility]   PHYSICIAN:  Ky Barban, M.D.DATE OF BIRTH:  1946-08-15   DATE OF ADMISSION:  DATE OF DISCHARGE:  LH                                HISTORY & PHYSICAL   CHIEF COMPLAINT:  Backache, gross hematuria.   HISTORY:  A 65 year old gentleman well known to me.  Several years ago he  had a kidney stone, underwent left ureterolithotomy by me and has done well.  I have not seen him until August 30, 2006, with gross hematuria history.  Hematuria has cleared up but then having microscopic hematuria.  A CT  urogram was done.  It shows that he has bilateral nephrolithiasis.  There is  a prominent calculus in the central renal pelvis on the right side and it  was non-obstructing but it is 1.4-cm in greatest diameter.  The other stones  are very small, mild aneurysmal dilation, dilatation of aorta, degenerative  changes of spine.  So, I have advised him to undergo a ESL for this right  renal calculus for which he is coming as an outpatient, will undergo the  procedure.  He was explained the procedure's complications, need for  additional procedures.  He understands.   PAST HISTORY:  1. Left ureterolithotomy, 1984.  2. He had a stone basket done by me in 2003.  3. No significant medical problem like diabetes or hypertension.   PERSONAL HISTORY:  He does not smoke or drink.   REVIEW OF SYSTEMS:  Denies any chest pain, orthopnea, PND, nausea, vomiting.   PHYSICAL EXAMINATION:  GENERAL:  Well nourished, well developed male not in  acute distress.  VITAL SIGNS:  Blood pressure 179/90, temperature is 99.  CENTRAL NERVOUS SYSTEM:  Negative.  HEENT:  Negative.  CHEST:  Symmetrical.  HEART:  Regular sinus rhythm.  ABDOMEN:  Soft, flat.  Liver, spleen, kidneys not palpable.  No CVA  tenderness.  EXTERNAL  GENITALIA:  He is circumcised.  Meatus is adequate.  Testicles are  normal.  RECTAL:  Sphincter __________  normal.  No rectal mass.  Prostate 1+,  smooth, and firm.   IMPRESSION:  1. Bilateral renal calculi, small.  2. A 1.4-cm right renal calculus.  3. Gross hematuria.  Urine cytology negative.   We will go ahead and do ESL for right renal calculus.      Ky Barban, M.D.  Electronically Signed     MIJ/MEDQ  D:  09/07/2006  T:  09/07/2006  Job:  [id number]

## 2011-04-03 NOTE — Discharge Summary (Signed)
[place]  Patient:    Dylan [name], Dylan [name] Visit Number: [id number] MRN: [id number]          Service Type: MED Location: [phone] Attending Physician:  Colon Branch Dictated by:   Dylan [name], P.A.C. Admit Date:  08/30/2001 Discharge Date: 09/01/2001   CC:         Dylan [name], M.D.  [facility] at [address]  [address], [place], [place] [address]   Discharge Summary  DATE OF BIRTH:  10/03/1946.  PROCEDURES: 1. Cardiac catheterization. 2. Coronary arteriogram. 3. Left ventriculogram. 4. Magnetic resonance imaging of the heart.  HOSPITAL COURSE:  [name] is a 65 year old male, with no known history of coronary artery disease, who was evaluated for week long history of chest discomfort that was sometimes sudden in onset and described as a tightness in his upper chest with radiation to his throat.  He also had some right shoulder discomfort with this.  His cardiac risk factors include family history of premature coronary artery disease, long-term tobacco use, and hyperlipidemia.  The patient was evaluated by Dr. Nona [name] and was not started on a beta blocker to an arresting heart rate of 50.  He was transferred to [facility] from [facility] for further evaluation and cardiac catheterization.  His enzymes were negative for MI.  His vitals were otherwise within normal limits, although he ran a temperature of 101.0 at one point.  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  The RCA had a 20% mid-distal stenosis and the LAD had a 20% proximal stenosis with a normal circumflex.  Dr. Noralyn [name]. Eden [name] could not be sure of the position of the LAD in comparison to the great arteries.  His left ventricular function was normal with an EF of approximately 60%.  Because of the anomalous coronary arteries, cardiac  MRI was ordered.  This was done on September 01, 2001.  The cardiac MRI did not show the LAD between the aorta and the pulmonary artery and the patient was stable with normal vital signs and a groin that was without hematoma or bruit.  His UA was negative and he was not anemic.  He was considered stable for discharge on September 01, 2001.  LABORATORY DATA:  Hemoglobin 14.1, hematocrit 40.6, wbcs 8.1, platelets 184,000.  Urinalysis was negative for nitrites and leukocytes with rare bacteria and 0 to 2 wbcs and rbcs per high powered field.  Urine culture was ordered and the results are pending.  CONDITION ON DISCHARGE:  Stable.  CONSULTANTS:  None.  COMPLICATIONS:  None.  DISCHARGE DIAGNOSES: 1. Chest pain, no obstructive coronary artery disease by cardiac    catheterization. 2. 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 3. Preserved left ventricular function. 4. Arthritis. 5. Family history of premature coronary artery disease. 6. Hyperlipidemia. 7. Ongoing tobacco use. 8. History of palpitations.  DISCHARGE INSTRUCTIONS:  His activity level is to include no driving, sexual or strenuous activity for two days.  He is to call the office for bleeding, swelling, or drainage at the catheter site.  He has a follow-up appointment with Dr. [name] on September 14, 2001 at 10 a.m.  He is to follow up with Dr. Donzetta [name] as well.  He is not to smoke.  DISCHARGE MEDICATIONS: 1. Aspirin 325 mg  q.d. 2. Lipitor 10 mg q.d. 3. Protonix 40 mg q.d. Dictated by:   Dylan [name], P.A.C. Attending Physician:  Colon Branch DD:  09/01/01 TD:  09/01/01 Job: [id number]

## 2011-04-03 NOTE — H&P (Signed)
NAME:  Dylan [name], Dylan [name]               ACCOUNT NO.:  [id number]   MEDICAL RECORD NO.:  [id number]          PATIENT TYPE:  AMB   LOCATION:  DAY                           FACILITY:  [facility]   PHYSICIAN:  Ky Barban, M.D.DATE OF BIRTH:  07-Dec-1945   DATE OF ADMISSION:  DATE OF DISCHARGE:  LH                              HISTORY & PHYSICAL   CHIEF COMPLAINT:  Right renal calculus.   HISTORY:  A 65 year old male who has a stone in the right kidney,  pelvis, and he is having some pain on that side.  ESL was done for  stone.  Part of it came out, but most of the stone is still there.  He  is being brought as an outpatient to undergo ESL for this right renal  calculus.   PROCEDURE:  The limitations, complication, use of additional procedure  is explained.   PAST MEDICAL HISTORY:  He had a left ureteral lithotomy done in 1984.  He also had a stone basket done in 2003.  No significant __________  problems.   PERSONAL HISTORY:  He does not smoke or drink.   REVIEW OF SYSTEMS:  He denies any chest pain, orthopnea, PND, nausea,  vomiting.   PHYSICAL EXAMINATION:  VITAL SIGNS:  Blood pressure 179/80.  Temperature  is normal.  CENTRAL NERVOUS SYSTEM:  No gross neurologic deficit.  HEAD/NECK/EENT negative.  CHEST:  Symmetrical.  HEART:  Regular sinus rhythm.  No murmur.  ABDOMEN:  Soft, flat.  Liver, spleen, kidneys are not palpable.  EXTERNAL GENITALIA:  Unremarkable.  RECTAL:  Deferred.  EXTREMITIES:  Normal.   IMPRESSION:  Right renal calculus.   PLAN:  ESL, right renal calculus.      Ky Barban, M.D.  Electronically Signed    MIJ/MEDQ  D:  10/05/2006  T:  10/05/2006  Job:  [id number]

## 2011-04-03 NOTE — Cardiovascular Report (Signed)
[place]. [facility]  Patient:    Dylan [name], Dylan [name] Visit Number: [id number] MRN: [id number]          Service Type: MED Location: [phone] Attending Physician:  Colon Branch Dictated by:   Noralyn Pick [place] Emms, M.D. [facility] Proc. Date: 08/31/01 Admit Date:  08/30/2001   CC:         Jonelle Sidle, M.D. [facility]             Donzetta Sprung, M.D.             [facility]                        Cardiac Catheterization  PROCEDURE PERFORMED: Coronary arteriography.  INDICATIONS: Recurrent chest pain.  Coronary risk factors.  DESCRIPTION OF PROCEDURE: Coronary arteriography was done from the right femoral artery. After initial engagement of the JL4 catheter, it became evident that there were no coronary ostia coming from the left coronary cusp. We initially injected the right coronary artery which was a large, dominant vessel. There were 20% multiple discrete lesions in the proximal and midportion.  An aortic root injection was done to further assess anomalous coronary arteries. The initial LAO aortic root injection showed an anomalous LAD circumflex coming off inferiorly from the right coronary ostia. Both a multipurpose and a JR4 catheter were able to be maneuvered to point downward into this anomalous vessel. The anomalous vessel clearly supplied the LAD and circumflex territory. There was 20-30% discrete stenosis at the takeoff of the LAD but no other critical disease in these arteries.  The initial bend of the anomaly would suggest that it was not going between the aortopulmonary window. However, this could not be 100% documented using angiographic techniques.  RIGHT ANTERIOR OBLIQUE VENTRICULOGRAPHY: RAO ventriculography revealed normal wall motion.  Ejection fraction 60%. There was no evidence of MR. Aortic pressure was 160/70. LV pressure was 165/15.  IMPRESSION: The patients chest pain would appear not to be from coronary disease.  He  does have a lingula infiltrate and had a fever this morning.  PLAN: He will have a followup chest x-ray and UA to further assess this. Given that he has anomalous coronary arteries, we will do a cardiac MRI to make sure that the LAD dose not course between the aortopulmonary window. However, I do not think his clinical representation was from coronary disease and he just happened to have a very rare cardiac coronary anomaly. Dictated by:   Noralyn Pick Eden Emms, M.D. [facility] Attending Physician:  Colon Branch DD:  08/31/01 TD:  09/01/01 Job: [id number]

## 2011-04-08 NOTE — Discharge Summary (Signed)
NAME:  Dylan [name]               ACCOUNT NO.:  [id number]  MEDICAL RECORD NO.:  [id number]           PATIENT TYPE:  I  LOCATION:  1616                         FACILITY:  [facility]  PHYSICIAN:  Ollen Gross, M.D.    DATE OF BIRTH:  Aug 02, 1946  DATE OF ADMISSION:  02/27/2011 DATE OF DISCHARGE:  03/02/2011                              DISCHARGE SUMMARY   ADMITTING DIAGNOSES: 1. Osteoarthritis of the left knee. 2. Hypertension. 3. Coronary arterial disease. 4. History of myocardial infarction in December 2010. 5. History of renal calculi. 6. History of finger, toes and nose fractures. 7. Osteoarthritis.  DISCHARGE DIAGNOSES: 1. Osteoarthritis of the left knee status post left total-knee     replacement arthroplasty. 2. Mild postoperative acute blood loss anemia that did not require     transfusion. 3. Hypertension. 4. Coronary arterial disease. 5. History of myocardial infarction in December 2010. 6. History of renal calculi. 7. History of finger, toes and nose fractures. 8. Osteoarthritis.  PROCEDURE:  February 27, 2011, left total-knee.  SURGEON:  Ollen Gross, M.D.  ASSISTANT:  Alexzandrew L. Perkins, P.A.C.  ANESTHESIA:  Surgery was performed under spinal anesthesia.  TOURNIQUET TIME:  37 minutes.  CONSULTATIONS:  None.  BRIEF HISTORY:  [name] is a 65 year old male with advanced arthritis of the left knee with progressive worsening pain and dysfunction.  He has failed nonoperative management and now presents for a total-knee arthroplasty.  LABORATORY DATA:  Preoperative CBC is not scanned into the chart, but the serial CBCs were followed for 3 days.  Hemoglobin dropped down to 10.3 and then 9.7 where it stabilized.  Last noted H and H was 9.7 and 28.9.  I do not have the admission Chemistry panel.  It was not scanned into the chart, but BMETs were normal for 48 hours.  Blood group type B positive.  HOSPITAL COURSE:  The patient was admitted to [facility], taken to the OR, and underwent the above-stated procedure without complication.  The patient tolerated the procedure well and was later transferred to the recovery room on the orthopedic floor.  He did have a little bit of nausea on the evening of surgery and the morning of day #1 that did improve throughout the day.  He was placed on Xarelto for DVT prophylaxis.  He was initially placed on a PCA, but that was discontinued on day #1 and weaned over to p.o. medications and had IV backup.  He started getting up out of bed and by day #2 he was feeling better.  Pain was under better control.  He was getting up and walking with therapy.  Hemoglobin was down to 9.7.  Dressing was changed and the incision looked good.  He continued to progress well and by day #3 he was meeting his goals and tolerating his medications.  His hemoglobin was stable and he was discharged home.  DISCHARGE PLAN:  The patient was discharged home on March 02, 2011.  DISCHARGE DIAGNOSES:  Please see above.  DISCHARGE MEDICATIONS:  Xarelto, Robaxin, Percocet.  Continue aspirin, lisinopril, nitroglycerin and Crestor.  DIET:  Heart-healthy diet.  ACTIVITY:  Weightbearing as tolerated.  Total-knee protocol.  Home health PT for therapy.  FOLLOWUP:  Follow up in 2 weeks.  DISPOSITION:  Home.  CONDITION ON DISCHARGE:  Improved.     Alexzandrew L. Julien Girt, P.A.C.   ______________________________ Ollen Gross, M.D.    ALP/MEDQ  D:  03/19/2011  T:  03/19/2011  Job:  [id number]  Electronically Signed by Patrica Duel P.A.C. on 04/02/2011 10:33:57 AM Electronically Signed by Ollen Gross M.D. on 04/08/2011 12:52:17 PM

## 2011-05-06 ENCOUNTER — Other Ambulatory Visit: Payer: Self-pay | Admitting: Cardiology

## 2011-07-01 ENCOUNTER — Telehealth: Payer: Self-pay | Admitting: *Deleted

## 2011-07-01 NOTE — Telephone Encounter (Signed)
Pt states he started working out at the [facility] following knee surgery. He states he really gives out of breath while working out. He rides a bike a lot and his breath just gives out. He states he doesn't have any problem with SOB when doing activities at home. He states he has had the SOB for about 3 months now. He states SOB is [facility] and that it just doesn't seem like he is building up any endurance. Pt is requesting appt. Pt given first available appt in [facility] with Dr. Antoine Poche, which is for Sept 17th. Offered to contact [facility] to inquire if they will have sooner appt there or in [place]. Pt declined and stated he would take appt in [place].

## 2011-07-07 DIAGNOSIS — R072 Precordial pain: Secondary | ICD-10-CM

## 2011-07-14 NOTE — Telephone Encounter (Signed)
Pt called back and s/w Lucendia Herrlich. He cancelled appt for 9/17 stating he saw his primary MD for SOB.

## 2011-08-03 ENCOUNTER — Ambulatory Visit: Payer: Medicare Other | Admitting: Cardiology

## 2011-08-05 ENCOUNTER — Other Ambulatory Visit: Payer: Self-pay | Admitting: Cardiovascular Disease

## 2011-08-11 ENCOUNTER — Other Ambulatory Visit: Payer: Self-pay | Admitting: Cardiovascular Disease

## 2011-12-22 ENCOUNTER — Other Ambulatory Visit: Payer: Self-pay | Admitting: Cardiovascular Disease

## 2012-05-08 ENCOUNTER — Other Ambulatory Visit: Payer: Self-pay | Admitting: Cardiology

## 2012-05-09 NOTE — Telephone Encounter (Signed)
..   Requested Prescriptions   Pending Prescriptions Disp Refills  . CRESTOR 20 MG tablet [Pharmacy Med Name: CRESTOR 20MG         TAB] 30 each 2    Sig: TAKE ONE TABLET BY MOUTH EVERY DAY  Patient needs to call office to make appointmnet

## 2012-06-13 ENCOUNTER — Emergency Department (HOSPITAL_COMMUNITY): Payer: Medicare Other

## 2012-06-13 ENCOUNTER — Encounter (HOSPITAL_COMMUNITY): Payer: Self-pay | Admitting: Emergency Medicine

## 2012-06-13 ENCOUNTER — Inpatient Hospital Stay (HOSPITAL_COMMUNITY)
Admission: EM | Admit: 2012-06-13 | Discharge: 2012-06-15 | DRG: 390 | Disposition: A | Discharge status: Home / Self Care | Payer: Medicare Other | Attending: Internal Medicine | Admitting: Internal Medicine

## 2012-06-13 DIAGNOSIS — Z91041 Radiographic dye allergy status: Secondary | ICD-10-CM

## 2012-06-13 DIAGNOSIS — K5900 Constipation, unspecified: Secondary | ICD-10-CM | POA: Diagnosis present

## 2012-06-13 DIAGNOSIS — I10 Essential (primary) hypertension: Secondary | ICD-10-CM | POA: Diagnosis present

## 2012-06-13 DIAGNOSIS — Z91013 Allergy to seafood: Secondary | ICD-10-CM

## 2012-06-13 DIAGNOSIS — Z79899 Other long term (current) drug therapy: Secondary | ICD-10-CM

## 2012-06-13 DIAGNOSIS — E785 Hyperlipidemia, unspecified: Secondary | ICD-10-CM | POA: Diagnosis present

## 2012-06-13 DIAGNOSIS — Z6829 Body mass index (BMI) 29.0-29.9, adult: Secondary | ICD-10-CM

## 2012-06-13 DIAGNOSIS — I2510 Atherosclerotic heart disease of native coronary artery without angina pectoris: Secondary | ICD-10-CM | POA: Diagnosis present

## 2012-06-13 DIAGNOSIS — I252 Old myocardial infarction: Secondary | ICD-10-CM

## 2012-06-13 DIAGNOSIS — K56609 Unspecified intestinal obstruction, unspecified as to partial versus complete obstruction: Principal | ICD-10-CM | POA: Diagnosis present

## 2012-06-13 DIAGNOSIS — Z7982 Long term (current) use of aspirin: Secondary | ICD-10-CM

## 2012-06-13 DIAGNOSIS — Z87442 Personal history of urinary calculi: Secondary | ICD-10-CM

## 2012-06-13 DIAGNOSIS — Z87891 Personal history of nicotine dependence: Secondary | ICD-10-CM

## 2012-06-13 DIAGNOSIS — Z9861 Coronary angioplasty status: Secondary | ICD-10-CM

## 2012-06-13 DIAGNOSIS — E669 Obesity, unspecified: Secondary | ICD-10-CM | POA: Diagnosis present

## 2012-06-13 HISTORY — DX: Essential (primary) hypertension: I10

## 2012-06-13 HISTORY — DX: Hyperlipidemia, unspecified: E78.5

## 2012-06-13 LAB — CBC WITH DIFFERENTIAL/PLATELET
Eosinophils Absolute: 0 10*3/uL (ref 0.0–0.7)
Eosinophils Relative: 0 % (ref 0–5)
HCT: 46.5 % (ref 39.0–52.0)
Hemoglobin: 15.8 g/dL (ref 13.0–17.0)
Lymphs Abs: 0.7 10*3/uL (ref 0.7–4.0)
MCH: 31.9 pg (ref 26.0–34.0)
MCV: 93.8 fL (ref 78.0–100.0)
Monocytes Relative: 5 % (ref 3–12)
RBC: 4.96 MIL/uL (ref 4.22–5.81)

## 2012-06-13 LAB — LIPASE, BLOOD: Lipase: 30 U/L (ref 11–59)

## 2012-06-13 LAB — COMPREHENSIVE METABOLIC PANEL
Alkaline Phosphatase: 75 U/L (ref 39–117)
BUN: 20 mg/dL (ref 6–23)
Calcium: 10.1 mg/dL (ref 8.4–10.5)
GFR calc Af Amer: 73 mL/min — ABNORMAL LOW (ref >90)
GFR calc non Af Amer: 63 mL/min — ABNORMAL LOW (ref >90)
Glucose, Bld: 122 mg/dL — ABNORMAL HIGH (ref 70–99)
Potassium: 4.1 mEq/L (ref 3.5–5.1)
Total Protein: 7.9 g/dL (ref 6.0–8.3)

## 2012-06-13 MED ORDER — HYDROMORPHONE HCL PF 1 MG/ML IJ SOLN
1.0000 mg | Freq: Once | INTRAMUSCULAR | Status: AC
  Administered 2012-06-14: 1 mg via INTRAVENOUS
  Filled 2012-06-13: qty 1

## 2012-06-13 MED ORDER — HYDROMORPHONE HCL PF 1 MG/ML IJ SOLN
1.0000 mg | Freq: Once | INTRAMUSCULAR | Status: AC
  Administered 2012-06-13: 1 mg via INTRAVENOUS
  Filled 2012-06-13: qty 1

## 2012-06-13 MED ORDER — ONDANSETRON HCL 4 MG/2ML IJ SOLN
4.0000 mg | Freq: Once | INTRAMUSCULAR | Status: AC
  Administered 2012-06-13: 4 mg via INTRAVENOUS
  Filled 2012-06-13: qty 2

## 2012-06-13 MED ORDER — SODIUM CHLORIDE 0.9 % IV SOLN
Freq: Once | INTRAVENOUS | Status: AC
  Administered 2012-06-13: 22:00:00 via INTRAVENOUS

## 2012-06-13 MED ORDER — IOHEXOL 300 MG/ML  SOLN
100.0000 mL | Freq: Once | INTRAMUSCULAR | Status: AC | PRN
  Administered 2012-06-13: 100 mL via INTRAVENOUS

## 2012-06-13 NOTE — ED Notes (Addendum)
Patient complaining of abdominal pain, constipation, and vomiting since this morning. States "I ate too much and now I can't get rid of it."

## 2012-06-13 NOTE — ED Notes (Signed)
Patient vomiting yellow fluid

## 2012-06-13 NOTE — ED Provider Notes (Signed)
History   This chart was scribed for Dylan Booze, MD by [name]. The patient was seen in room [facility] and the patient's care was started at 10:17PM.    CSN: [id number]  Arrival date & time 06/13/12  2119   First MD Initiated Contact with Patient 06/13/12 2214      Chief Complaint  Patient presents with  . Constipation  . Emesis  . Abdominal Pain    (Consider location/radiation/quality/duration/timing/severity/associated sxs/prior treatment) The history is provided by the patient. No language interpreter was used.   Dylan [name] is a 66 y.o. male who presents to the Emergency Department complaining of persistent, moderate to severe abdomainal pain with associated constipation and emesis with an onset tonight. Pt was eating when the symptoms began. Nml BMs present yesterday. Vomiting or passing gas did not alleviate the pain. Pt had a physical 2 weeks ago and states that PCP said he was fine. Pt rates the severity of the pain 6/10, states it hurts worse on the left-sided abd, and has been getting progressively worse. No HA, fever, neck pain, sore throat, rash, back pain, CP, SOB, diarrhea, dysuria, or extremity pain, edema, weakness, numbness, or tingling. No known allergies to medications. No other pertinent medical symptoms.  PCP: Dr. Garner Nash in [place]  Past Medical History  Diagnosis Date  . Acute MI   . Kidney stone   . Hypertension     Past Surgical History  Procedure Date  . Kidney surgery   . Stents   . Cardiac surgery   . Total knee arthroplasty     History reviewed. No pertinent family history.  History  Substance Use Topics  . Smoking status: Never Smoker   . Smokeless tobacco: Not on file  . Alcohol Use: No      Review of Systems 10 Systems reviewed and all are negative for acute change except as noted in the HPI.   Allergies  Fish allergy; Iodine; and Shrimp  Home Medications   Current Outpatient Rx  Name Route Sig Dispense Refill    . ASPIRIN EC 81 MG PO TBEC Oral Take 81 mg by mouth every morning.    Marland Kitchen CRESTOR 20 MG PO TABS  TAKE ONE TABLET BY MOUTH EVERY DAY 30 each 2    Patient needs to call office to make appointmnet  . GLUCOSAMINE HCL PO Oral Take 2,000 mg by mouth every morning.    . IBUPROFEN 200 MG PO TABS Oral Take 1,200 mg by mouth daily.    Marland Kitchen LISINOPRIL 10 MG PO TABS  TAKE ONE TABLET BY MOUTH EVERY DAY 30 tablet 11  . PANTOPRAZOLE SODIUM 40 MG PO TBEC Oral Take 40 mg by mouth every morning.    Marland Kitchen PLAVIX 75 MG PO TABS  TAKE ONE TABLET BY MOUTH EVERY DAY WITH FOOD 30 each 12  . POTASSIUM GLUCONATE PO Oral Take 1 tablet by mouth every morning.    Marland Kitchen NITROGLYCERIN 0.4 MG SL SUBL  DISSOLVE ONE TABLET UNDER THE TONGUE EVERY 5 MINUTES AS NEEDED FOR CHEST PAIN.  DO NOT EXCEED A TOTAL OF 3 DOSES IN 15 MINUTES 25 tablet 3    Dispense as written.    BP 127/75  Pulse 93  Temp 97.8 F (36.6 C) (Oral)  Resp 20  Ht 6\' 2"  (1.88 m)  Wt 238 lb (107.956 kg)  BMI 30.56 kg/m2  SpO2 95%  Physical Exam  Nursing note and vitals reviewed. Constitutional: He is oriented to person, place, and time.  He appears well-developed and well-nourished. No distress.  HENT:  Head: Normocephalic and atraumatic.  Right Ear: External ear normal.  Left Ear: External ear normal.  Eyes: EOM are normal.  Neck: Normal range of motion. Neck supple. No tracheal deviation present.  Cardiovascular: Normal rate, regular rhythm and normal heart sounds.  Exam reveals no gallop and no friction rub.   No murmur heard. Pulmonary/Chest: Effort normal. No respiratory distress. He has no wheezes.  Abdominal: Soft. He exhibits distension (mild). There is tenderness (moderate diffuse tenderness). There is rebound (+/- rebound tenderness). There is no guarding.       Decreased bowel sounds  Musculoskeletal: Normal range of motion. He exhibits no tenderness.  Neurological: He is alert and oriented to person, place, and time.  Skin: Skin is warm and dry. No  rash noted.  Psychiatric: He has a normal mood and affect. His behavior is normal.    ED Course  Procedures (including critical care time)  DIAGNOSTIC STUDIES: Oxygen Saturation is 95% on room air, adequate by my interpretation.    COORDINATION OF CARE:  10:24PM - EDMD will order dilaudid, IV fluids, Zofran, abd CT, blood w/u, and UA for the pt.  Results for orders placed during the hospital encounter of 06/13/12  CBC WITH DIFFERENTIAL      Component Value Range   WBC 7.9  4.0 - 10.5 K/uL   RBC 4.96  4.22 - 5.81 MIL/uL   Hemoglobin 15.8  13.0 - 17.0 g/dL   HCT 16.1  09.6 - 04.5 %   MCV 93.8  78.0 - 100.0 fL   MCH 31.9  26.0 - 34.0 pg   MCHC 34.0  30.0 - 36.0 g/dL   RDW 40.9  81.1 - 91.4 %   Platelets 167  150 - 400 K/uL   Neutrophils Relative 86 (*) 43 - 77 %   Neutro Abs 6.8  1.7 - 7.7 K/uL   Lymphocytes Relative 9 (*) 12 - 46 %   Lymphs Abs 0.7  0.7 - 4.0 K/uL   Monocytes Relative 5  3 - 12 %   Monocytes Absolute 0.4  0.1 - 1.0 K/uL   Eosinophils Relative 0  0 - 5 %   Eosinophils Absolute 0.0  0.0 - 0.7 K/uL   Basophils Relative 0  0 - 1 %   Basophils Absolute 0.0  0.0 - 0.1 K/uL  COMPREHENSIVE METABOLIC PANEL      Component Value Range   Sodium 142  135 - 145 mEq/L   Potassium 4.1  3.5 - 5.1 mEq/L   Chloride 102  96 - 112 mEq/L   CO2 29  19 - 32 mEq/L   Glucose, Bld 122 (*) 70 - 99 mg/dL   BUN 20  6 - 23 mg/dL   Creatinine, Ser 7.82  0.50 - 1.35 mg/dL   Calcium 95.6  8.4 - 21.3 mg/dL   Total Protein 7.9  6.0 - 8.3 g/dL   Albumin 4.3  3.5 - 5.2 g/dL   AST 21  0 - 37 U/L   ALT 16  0 - 53 U/L   Alkaline Phosphatase 75  39 - 117 U/L   Total Bilirubin 0.9  0.3 - 1.2 mg/dL   GFR calc non Af Amer 63 (*) >90 mL/min   GFR calc Af Amer 73 (*) >90 mL/min  LIPASE, BLOOD      Component Value Range   Lipase 30  11 - 59 U/L  URINALYSIS, ROUTINE W REFLEX MICROSCOPIC  Component Value Range   Color, Urine AMBER (*) YELLOW   APPearance CLEAR  CLEAR   Specific Gravity,  Urine >1.030 (*) 1.005 - 1.030   pH 6.0  5.0 - 8.0   Glucose, UA NEGATIVE  NEGATIVE mg/dL   Hgb urine dipstick NEGATIVE  NEGATIVE   Bilirubin Urine NEGATIVE  NEGATIVE   Ketones, ur TRACE (*) NEGATIVE mg/dL   Protein, ur TRACE (*) NEGATIVE mg/dL   Urobilinogen, UA 0.2  0.0 - 1.0 mg/dL   Nitrite NEGATIVE  NEGATIVE   Leukocytes, UA NEGATIVE  NEGATIVE  URINE MICROSCOPIC-ADD ON      Component Value Range   Squamous Epithelial / LPF RARE  RARE   WBC, UA 3-6  <3 WBC/hpf   RBC / HPF 3-6  <3 RBC/hpf   Bacteria, UA FEW (*) RARE    Ct Abdomen Pelvis W Contrast  06/14/2012  *RADIOLOGY REPORT*  Clinical Data: Constipation.  Emesis.  Abdominal pain.  History of stones, hypertension, MI, cardiac stents  CT ABDOMEN AND PELVIS WITH CONTRAST  Technique:  Multidetector CT imaging of the abdomen and pelvis was performed following the standard protocol during bolus administration of intravenous contrast.  Contrast: OMNIPAQUE IOHEXOL 300 MG/ML  SOLN  Comparison: Ultrasound 02/24/2010, CT 09/02/2007  Findings: The lung bases are unremarkable in appearance.  The heart is enlarged.  There are coronary artery calcifications and coronary stents.  No focal abnormality identified within the liver, spleen, pancreas, or adrenal glands.  Low attenuation lesions are identified within the kidneys, most consistent with cysts.  Bilateral intrarenal calculi identified.  No ureteral stones are present.  The stomach has a normal appearance.  Multiple dilated small bowel loops are identified.  The distal small bowel loops are normal in caliber however.  Transition zone is identified within a right lower quadrant small bowel loop, best seen on image number 70.  There is free pelvic fluid.  Multiple colonic diverticula are identified.  No CT evidence for acute diverticulitis however. The appendix is well seen and has a normal appearance.   Small infrarenal abdominal aortic aneurysm is 3.1 cm in diameter.  There are mild degenerative  changes in the lumbar spine.  IMPRESSION:  1.  Small bowel obstruction, best localized to the right lower quadrant. 2.  Free pelvic fluid. 3.  Bilateral renal cysts and intrarenal calculi.  Original Report Authenticated By: Patterson Hammersmith, M.D.     1. Small bowel obstruction       MDM  Abdominal pain with generalized tenderness. This clearly is not due to constipation. He will need CT scan for further evaluation.  He got good pain relief with hydromorphone and good relief of nausea with ondansetron. I reviewed the CT and appears to show a small bowel obstruction although official report is pending. He will need to be admitted.  Radiologist interpretation of CT scan agrees with small bowel obstruction. Consultation will be obtained with hospitalist and he will also need a surgical consultation since he does not have prior abdominal surgery.  I personally performed the services described in this documentation, which was scribed in my presence. The recorded information has been reviewed and considered.       Dylan Booze, MD 06/14/12 Jacinta Shoe

## 2012-06-14 ENCOUNTER — Encounter (HOSPITAL_COMMUNITY): Payer: Self-pay | Admitting: Internal Medicine

## 2012-06-14 ENCOUNTER — Inpatient Hospital Stay (HOSPITAL_COMMUNITY): Payer: Medicare Other

## 2012-06-14 DIAGNOSIS — I10 Essential (primary) hypertension: Secondary | ICD-10-CM | POA: Diagnosis present

## 2012-06-14 DIAGNOSIS — E669 Obesity, unspecified: Secondary | ICD-10-CM

## 2012-06-14 DIAGNOSIS — K56609 Unspecified intestinal obstruction, unspecified as to partial versus complete obstruction: Principal | ICD-10-CM

## 2012-06-14 DIAGNOSIS — I2510 Atherosclerotic heart disease of native coronary artery without angina pectoris: Secondary | ICD-10-CM

## 2012-06-14 DIAGNOSIS — E785 Hyperlipidemia, unspecified: Secondary | ICD-10-CM

## 2012-06-14 LAB — COMPREHENSIVE METABOLIC PANEL
AST: 16 U/L (ref 0–37)
Albumin: 3.6 g/dL (ref 3.5–5.2)
BUN: 20 mg/dL (ref 6–23)
Calcium: 9.4 mg/dL (ref 8.4–10.5)
Creatinine, Ser: 1.14 mg/dL (ref 0.50–1.35)
GFR calc non Af Amer: 65 mL/min — ABNORMAL LOW (ref >90)

## 2012-06-14 LAB — URINALYSIS, ROUTINE W REFLEX MICROSCOPIC
Bilirubin Urine: NEGATIVE
Nitrite: NEGATIVE
Specific Gravity, Urine: >1.03 — ABNORMAL HIGH (ref 1.005–1.030)
Urobilinogen, UA: 0.2 mg/dL (ref 0.0–1.0)

## 2012-06-14 LAB — CBC
MCH: 31.8 pg (ref 26.0–34.0)
MCV: 94.4 fL (ref 78.0–100.0)
Platelets: 132 10*3/uL — ABNORMAL LOW (ref 150–400)
RDW: 12.6 % (ref 11.5–15.5)

## 2012-06-14 LAB — MRSA PCR SCREENING: MRSA by PCR: NEGATIVE

## 2012-06-14 LAB — URINE MICROSCOPIC-ADD ON

## 2012-06-14 MED ORDER — SODIUM CHLORIDE 0.9 % IJ SOLN
INTRAMUSCULAR | Status: AC
  Administered 2012-06-14: 10 mL
  Filled 2012-06-14: qty 3

## 2012-06-14 MED ORDER — SODIUM CHLORIDE 0.9 % IJ SOLN
INTRAMUSCULAR | Status: AC
  Filled 2012-06-14: qty 3

## 2012-06-14 MED ORDER — METOCLOPRAMIDE HCL 5 MG/ML IJ SOLN
10.0000 mg | Freq: Once | INTRAMUSCULAR | Status: AC
  Administered 2012-06-14: 10 mg via INTRAVENOUS
  Filled 2012-06-14: qty 2

## 2012-06-14 MED ORDER — FAMOTIDINE IN NACL 20-0.9 MG/50ML-% IV SOLN
20.0000 mg | Freq: Once | INTRAVENOUS | Status: AC
  Administered 2012-06-14: 20 mg via INTRAVENOUS
  Filled 2012-06-14: qty 50

## 2012-06-14 MED ORDER — FAMOTIDINE IN NACL 20-0.9 MG/50ML-% IV SOLN
INTRAVENOUS | Status: AC
  Filled 2012-06-14: qty 50

## 2012-06-14 MED ORDER — POTASSIUM CHLORIDE IN NACL 20-0.9 MEQ/L-% IV SOLN
INTRAVENOUS | Status: DC
  Administered 2012-06-14 (×3): via INTRAVENOUS

## 2012-06-14 MED ORDER — HYDRALAZINE HCL 20 MG/ML IJ SOLN: 10.0000 mg | INTRAMUSCULAR | Status: DC | PRN

## 2012-06-14 MED ORDER — PANTOPRAZOLE SODIUM 40 MG IV SOLR
40.0000 mg | INTRAVENOUS | Status: DC
  Administered 2012-06-14 – 2012-06-15 (×2): 40 mg via INTRAVENOUS
  Filled 2012-06-14 (×2): qty 40

## 2012-06-14 MED ORDER — ONDANSETRON HCL 4 MG/2ML IJ SOLN: 4.0000 mg | INTRAMUSCULAR | Status: DC | PRN

## 2012-06-14 MED ORDER — HYDROMORPHONE HCL PF 1 MG/ML IJ SOLN
0.5000 mg | INTRAMUSCULAR | Status: DC | PRN
  Administered 2012-06-14: 0.5 mg via INTRAVENOUS
  Filled 2012-06-14: qty 1

## 2012-06-14 MED ORDER — ACETAMINOPHEN 650 MG RE SUPP: 650.0000 mg | RECTAL | Status: DC | PRN

## 2012-06-14 MED ORDER — ENOXAPARIN SODIUM 40 MG/0.4ML ~~LOC~~ SOLN
40.0000 mg | SUBCUTANEOUS | Status: DC
  Administered 2012-06-14: 40 mg via SUBCUTANEOUS
  Filled 2012-06-14: qty 0.4

## 2012-06-14 MED ORDER — TRAZODONE HCL 50 MG PO TABS: 25.0000 mg | ORAL_TABLET | Freq: Every evening | ORAL | Status: DC | PRN

## 2012-06-14 MED ORDER — DIPHENHYDRAMINE HCL 50 MG/ML IJ SOLN
25.0000 mg | Freq: Once | INTRAMUSCULAR | Status: AC
  Administered 2012-06-14: 25 mg via INTRAVENOUS
  Filled 2012-06-14: qty 1

## 2012-06-14 NOTE — Consult Note (Signed)
Reason for Consult:Small Bowel Obstruction  Referring Physician: [facility]  Dylan [name] is an 66 y.o. male.  HPI: Patient presented to [facility] last night with increasing nausea and diffuse abdominal pain.  No similar symptoms in the past.  Last BM was a couple days ago.  No melena.  No hematochezia.  No fever or chills.  No hematemesis. "Feels like I over stuffed myself".  No prior abdominal surgery.  + renal surgery.  Has noted a darkening of his urine.  Pt's wife states she thought he had hematuria last night.  Patient has had a BM this AM.  Symptoms are much better than last night.   Past Medical History  Diagnosis Date  . 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 Kidney stone   . Hypertension   . Hyperlipemia     Past Surgical History  Procedure Date  . Kidney surgery   . Stents   . Coronary angioplasty with stent placement 10/2009  . Total knee arthroplasty     History reviewed. No pertinent family history.  Social History:  reports that he quit smoking about 10 years ago. He does not have any smokeless tobacco history on file. He reports that he does not drink alcohol or use illicit drugs.  Allergies:  Allergies  Allergen Reactions  . Fish Allergy Nausea And Vomiting  . Iodine Nausea And Vomiting  . Shrimp (Shellfish Allergy) Nausea And Vomiting    Medications:  I have reviewed the patient's current medications. Prior to Admission:  Prescriptions prior to admission  Medication Sig Dispense Refill  . aspirin EC 81 MG tablet Take 81 mg by mouth every morning.      Marland Kitchen CRESTOR 20 MG tablet TAKE ONE TABLET BY MOUTH EVERY DAY  30 each  2  . GLUCOSAMINE HCL PO Take 2,000 mg by mouth every morning.      Marland Kitchen ibuprofen (ADVIL,MOTRIN) 200 MG tablet Take 1,200 mg by mouth daily.      Marland Kitchen lisinopril (PRINIVIL,ZESTRIL) 10 MG tablet TAKE ONE TABLET BY MOUTH EVERY DAY  30 tablet  11  . pantoprazole (PROTONIX) 40 MG tablet Take 40 mg by mouth every morning.      Marland Kitchen PLAVIX 75 MG tablet TAKE ONE TABLET  BY MOUTH EVERY DAY WITH FOOD  30 each  12  . POTASSIUM GLUCONATE PO Take 1 tablet by mouth every morning.      . nitroGLYCERIN (NITROSTAT) 0.4 MG SL tablet DISSOLVE ONE TABLET UNDER THE TONGUE EVERY 5 MINUTES AS NEEDED FOR CHEST PAIN.  DO NOT EXCEED A TOTAL OF 3 DOSES IN 15 MINUTES  25 tablet  3   Scheduled:   . sodium chloride   Intravenous Once  . diphenhydrAMINE  25 mg Intravenous Once  . enoxaparin (LOVENOX) injection  40 mg Subcutaneous Q24H  . famotidine (PEPCID) IV  20 mg Intravenous Once  . HYDROmorphone  1 mg Intravenous Once  . HYDROmorphone  1 mg Intravenous Once  . metoCLOPramide (REGLAN) injection  10 mg Intravenous Once  . ondansetron  4 mg Intravenous Once  . pantoprazole (PROTONIX) IV  40 mg Intravenous Q24H  . sodium chloride      . sodium chloride      . sodium chloride       Continuous:   . 0.9 % NaCl with KCl 20 mEq / L 125 mL/hr at 06/14/12 1215   AOZ:HYQMVHQIONGEX, hydrALAZINE, HYDROmorphone (DILAUDID) injection, iohexol, ondansetron (ZOFRAN) IV, traZODone  Results for orders placed during the hospital encounter of  06/13/12 (from the past 48 hour(s))  CBC WITH DIFFERENTIAL     Status: Abnormal   Collection Time   06/13/12 10:19 PM      Component Value Range Comment   WBC 7.9  4.0 - 10.5 K/uL    RBC 4.96  4.22 - 5.81 MIL/uL    Hemoglobin 15.8  13.0 - 17.0 g/dL    HCT 16.1  09.6 - 04.5 %    MCV 93.8  78.0 - 100.0 fL    MCH 31.9  26.0 - 34.0 pg    MCHC 34.0  30.0 - 36.0 g/dL    RDW 40.9  81.1 - 91.4 %    Platelets 167  150 - 400 K/uL    Neutrophils Relative 86 (*) 43 - 77 %    Neutro Abs 6.8  1.7 - 7.7 K/uL    Lymphocytes Relative 9 (*) 12 - 46 %    Lymphs Abs 0.7  0.7 - 4.0 K/uL    Monocytes Relative 5  3 - 12 %    Monocytes Absolute 0.4  0.1 - 1.0 K/uL    Eosinophils Relative 0  0 - 5 %    Eosinophils Absolute 0.0  0.0 - 0.7 K/uL    Basophils Relative 0  0 - 1 %    Basophils Absolute 0.0  0.0 - 0.1 K/uL   COMPREHENSIVE METABOLIC PANEL     Status:  Abnormal   Collection Time   06/13/12 10:19 PM      Component Value Range Comment   Sodium 142  135 - 145 mEq/L    Potassium 4.1  3.5 - 5.1 mEq/L    Chloride 102  96 - 112 mEq/L    CO2 29  19 - 32 mEq/L    Glucose, Bld 122 (*) 70 - 99 mg/dL    BUN 20  6 - 23 mg/dL    Creatinine, Ser 7.82  0.50 - 1.35 mg/dL    Calcium 95.6  8.4 - 10.5 mg/dL    Total Protein 7.9  6.0 - 8.3 g/dL    Albumin 4.3  3.5 - 5.2 g/dL    AST 21  0 - 37 U/L    ALT 16  0 - 53 U/L    Alkaline Phosphatase 75  39 - 117 U/L    Total Bilirubin 0.9  0.3 - 1.2 mg/dL    GFR calc non Af Amer 63 (*) >90 mL/min    GFR calc Af Amer 73 (*) >90 mL/min   LIPASE, BLOOD     Status: Normal   Collection Time   06/13/12 10:19 PM      Component Value Range Comment   Lipase 30  11 - 59 U/L   URINALYSIS, ROUTINE W REFLEX MICROSCOPIC     Status: Abnormal   Collection Time   06/13/12 11:37 PM      Component Value Range Comment   Color, Urine AMBER (*) YELLOW BIOCHEMICALS MAY BE AFFECTED BY COLOR   APPearance CLEAR  CLEAR    Specific Gravity, Urine >1.030 (*) 1.005 - 1.030    pH 6.0  5.0 - 8.0    Glucose, UA NEGATIVE  NEGATIVE mg/dL    Hgb urine dipstick NEGATIVE  NEGATIVE    Bilirubin Urine NEGATIVE  NEGATIVE    Ketones, ur TRACE (*) NEGATIVE mg/dL    Protein, ur TRACE (*) NEGATIVE mg/dL    Urobilinogen, UA 0.2  0.0 - 1.0 mg/dL    Nitrite NEGATIVE  NEGATIVE  Leukocytes, UA NEGATIVE  NEGATIVE   URINE MICROSCOPIC-ADD ON     Status: Abnormal   Collection Time   06/13/12 11:37 PM      Component Value Range Comment   Squamous Epithelial / LPF RARE  RARE    WBC, UA 3-6  <3 WBC/hpf    RBC / HPF 3-6  <3 RBC/hpf    Bacteria, UA FEW (*) RARE   CBC     Status: Abnormal   Collection Time   06/14/12  5:24 AM      Component Value Range Comment   WBC 6.5  4.0 - 10.5 K/uL    RBC 4.47  4.22 - 5.81 MIL/uL    Hemoglobin 14.2  13.0 - 17.0 g/dL    HCT 16.1  09.6 - 04.5 %    MCV 94.4  78.0 - 100.0 fL    MCH 31.8  26.0 - 34.0 pg    MCHC  33.6  30.0 - 36.0 g/dL    RDW 40.9  81.1 - 91.4 %    Platelets 132 (*) 150 - 400 K/uL   COMPREHENSIVE METABOLIC PANEL     Status: Abnormal   Collection Time   06/14/12  5:24 AM      Component Value Range Comment   Sodium 142  135 - 145 mEq/L    Potassium 4.6  3.5 - 5.1 mEq/L    Chloride 102  96 - 112 mEq/L    CO2 29  19 - 32 mEq/L    Glucose, Bld 118 (*) 70 - 99 mg/dL    BUN 20  6 - 23 mg/dL    Creatinine, Ser 7.82  0.50 - 1.35 mg/dL    Calcium 9.4  8.4 - 95.6 mg/dL    Total Protein 7.0  6.0 - 8.3 g/dL    Albumin 3.6  3.5 - 5.2 g/dL    AST 16  0 - 37 U/L    ALT 12  0 - 53 U/L    Alkaline Phosphatase 62  39 - 117 U/L    Total Bilirubin 0.8  0.3 - 1.2 mg/dL    GFR calc non Af Amer 65 (*) >90 mL/min    GFR calc Af Amer 75 (*) >90 mL/min   MRSA PCR SCREENING     Status: Normal   Collection Time   06/14/12 11:14 AM      Component Value Range Comment   MRSA by PCR NEGATIVE  NEGATIVE     Ct Abdomen Pelvis W Contrast  06/14/2012  *RADIOLOGY REPORT*  Clinical Data: Constipation.  Emesis.  Abdominal pain.  History of stones, hypertension, MI, cardiac stents  CT ABDOMEN AND PELVIS WITH CONTRAST  Technique:  Multidetector CT imaging of the abdomen and pelvis was performed following the standard protocol during bolus administration of intravenous contrast.  Contrast: OMNIPAQUE IOHEXOL 300 MG/ML  SOLN  Comparison: Ultrasound 02/24/2010, CT 09/02/2007  Findings: The lung bases are unremarkable in appearance.  The heart is enlarged.  There are coronary artery calcifications and coronary stents.  No focal abnormality identified within the liver, spleen, pancreas, or adrenal glands.  Low attenuation lesions are identified within the kidneys, most consistent with cysts.  Bilateral intrarenal calculi identified.  No ureteral stones are present.  The stomach has a normal appearance.  Multiple dilated small bowel loops are identified.  The distal small bowel loops are normal in caliber however.   Transition zone is identified within a right lower quadrant small bowel loop, best seen  on image number 70.  There is free pelvic fluid.  Multiple colonic diverticula are identified.  No CT evidence for acute diverticulitis however. The appendix is well seen and has a normal appearance.   Small infrarenal abdominal aortic aneurysm is 3.1 cm in diameter.  There are mild degenerative changes in the lumbar spine.  IMPRESSION:  1.  Small bowel obstruction, best localized to the right lower quadrant. 2.  Free pelvic fluid. 3.  Bilateral renal cysts and intrarenal calculi.  Original Report Authenticated By: Patterson Hammersmith, M.D.    Review of Systems  Constitutional: Negative for fever and chills.  HENT: Negative.   Eyes: Negative.   Respiratory: Negative.   Cardiovascular: Negative.   Gastrointestinal: Positive for nausea, vomiting (Last night) and abdominal pain (difuse). Negative for diarrhea.  Genitourinary: Positive for dysuria.  Musculoskeletal: Negative.   Skin: Negative.   Neurological: Negative.   Psychiatric/Behavioral: Negative.    Blood pressure 138/76, pulse 75, temperature 98.3 F (36.8 C), temperature source Oral, resp. rate 16, height 6\' 2"  (1.88 m), weight 104.872 kg (231 lb 3.2 oz), SpO2 98.00%. Physical Exam  Constitutional: He is oriented to person, place, and time. He appears well-developed and well-nourished. No distress.  HENT:  Head: Normocephalic and atraumatic.  Eyes: Conjunctivae and EOM are normal. Pupils are equal, round, and reactive to light.  Neck: Normal range of motion. Neck supple. No tracheal deviation present. No thyromegaly present.  Cardiovascular: Normal rate, regular rhythm and normal heart sounds.   Respiratory: Effort normal and breath sounds normal. No respiratory distress.  GI: Soft. Bowel sounds are normal. He exhibits no distension and no mass. There is tenderness (mild diffuse.  No peritoneal signs.). There is no rebound and no guarding.    Lymphadenopathy:    He has no cervical adenopathy.  Neurological: He is alert and oriented to person, place, and time.  Skin: Skin is warm and dry.    Assessment/Plan: Partial SBO versus ileus due to possible UTI.  Continue IV fluid hydration.  No acute surgical indications.  Can advance pt's diet slowly as tolerated starting with clears.  I will continue to follow but suspect due to current clinical course patient will continue to improve    Winson Eichorn C 06/14/2012, 4:41 PM

## 2012-06-14 NOTE — Progress Notes (Signed)
Patient admitted earlier today by Dr. Orvan Falconer.  Patient seen and examined, database reviewed.  The patient is admitted with small bowel obstruction. He's not having having any significant abdominal pain at this time. He's not had any vomiting since admission. He reports having a very small bowel movement earlier today. He feels that he is passing gas. Surgical consultation has been requested. Agree with current management. Patient is n.p.o. for bowel rest. We'll advance diet as he tolerates.

## 2012-06-14 NOTE — ED Provider Notes (Signed)
12:44 AM:  T/C to Dr. Orvan Falconer, hospitalist, case discussed, including:  HPI, pertinent PM/SHx, VS/PE, dx testing, ED course and treatment.  Agreeable to admission.  Requests to write temporary orders, med surg bed. Pt stable in ED with no significant deterioration in condition.The patient appears reasonably stabilized for admission considering the current resources, flow, and capabilities available in the ED at this time, and I doubt any other [facility] requiring further screening and/or treatment in the ED prior to admission.  Nicoletta Dress. Colon Branch, MD 06/14/12 9811

## 2012-06-14 NOTE — Progress Notes (Signed)
UR Chart Review Completed  

## 2012-06-14 NOTE — H&P (Signed)
[facility] History and Physical  Dylan [name] RUE:[id number] DOB: 1946/08/27 DOA:06/14/2012   PCP:   Donzetta Sprung, MD   Chief Complaint:  Abdominal pain and vomiting since this morning  HPI: Dylan [name] is an 66 y.o. male. Caucasian gentleman with a history of hypertension hyperlipidemia and coronary artery disease was in good health until he had a very heavy meal last night and woke this morning drank some coffee, when to have his normal bowel movement but noticed that it is very loose watery. Then he vomited copious amounts of melon that he'd eaten the night before. Since then he's been having vomiting, abdominal pain and tightness. He feels constipated and has had no further bowel movements, but feels he's passed a minimal amount of gas. He is not noted in the frank swelling of the abdomen.  He has no  prior history of abdominal surgery  Rewiew of Systems:   All systems negative except as marked bold or noted in the HPI;  Constitutional: Negative for malaise, fever and chills. ;  Eyes: Negative for eye pain, redness and discharge. ;  ENMT: Negative for ear pain, hoarseness, nasal congestion, sinus pressure and sore throat. ;  Cardiovascular: Negative for chest pain, palpitations, diaphoresis, dyspnea and peripheral edema. ;  Respiratory: Negative for cough, hemoptysis, wheezing and stridor. ;  Gastrointestinal: Negative for  diarrhea, constipation, melena, blood in stool, hematemesis, jaundice and rectal bleeding. unusual weight loss..   Genitourinary: Negative for frequency, dysuria, incontinence,flank pain and hematuria; Musculoskeletal: Negative for back pain and neck pain. Negative for swelling and trauma.; Generalized body aches each morning relieved by ibuprofen Skin: . Negative for pruritus, rash, abrasions, bruising and skin lesion.; ulcerations Neuro: Negative for headache, lightheadedness and neck stiffness. Negative for weakness, altered level of consciousness ,  altered mental status, extremity weakness, burning feet, involuntary movement, seizure and syncope.  Psych: negative for anxiety, depression, insomnia, tearfulness, panic attacks, hallucinations, paranoia, suicidal or homicidal ideation    Past Medical History  Diagnosis Date  . 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 Kidney stone   . Hypertension   . Hyperlipemia     Past Surgical History  Procedure Date  . Kidney surgery   . Stents   . Coronary angioplasty with stent placement 10/2009  . Total knee arthroplasty     Medications:  HOME MEDS: Prior to Admission medications   Medication Sig Start Date End Date Taking? Authorizing Provider  aspirin EC 81 MG tablet Take 81 mg by mouth every morning.   Yes Historical Provider, MD  CRESTOR 20 MG tablet TAKE ONE TABLET BY MOUTH EVERY DAY 05/08/12  Yes Rollene Rotunda, MD  GLUCOSAMINE HCL PO Take 2,000 mg by mouth every morning.   Yes Historical Provider, MD  ibuprofen (ADVIL,MOTRIN) 200 MG tablet Take 1,200 mg by mouth daily.   Yes Historical Provider, MD  lisinopril (PRINIVIL,ZESTRIL) 10 MG tablet TAKE ONE TABLET BY MOUTH EVERY DAY 08/05/11  Yes Tonny Bollman, MD  pantoprazole (PROTONIX) 40 MG tablet Take 40 mg by mouth every morning.   Yes Historical Provider, MD  PLAVIX 75 MG tablet TAKE ONE TABLET BY MOUTH EVERY DAY WITH FOOD 08/11/11  Yes Tonny Bollman, MD  POTASSIUM GLUCONATE PO Take 1 tablet by mouth every morning.   Yes Historical Provider, MD  nitroGLYCERIN (NITROSTAT) 0.4 MG SL tablet DISSOLVE ONE TABLET UNDER THE TONGUE EVERY 5 MINUTES AS NEEDED FOR CHEST PAIN.  DO NOT EXCEED A TOTAL OF 3 DOSES IN 15 MINUTES 12/22/11  Gaylord Shih, MD     Allergies:  Allergies  Allergen Reactions  . Fish Allergy Nausea And Vomiting  . Iodine Nausea And Vomiting  . Shrimp (Shellfish Allergy) Nausea And Vomiting    Social History:   reports that he quit smoking about 10 years ago. He does not have any smokeless tobacco history on file. He reports that he  does not drink alcohol or use illicit drugs. 40-pack-year tobacco history  Family History: History reviewed. No pertinent family history. no family history of coronary artery disease or cancer   Physical Exam: Filed Vitals:   06/13/12 2151 06/13/12 2155  BP:  127/75  Pulse:  93  Temp:  97.8 F (36.6 C)  TempSrc:  Oral  Resp:  20  Height: 6\' 2"  (1.88 m)   Weight: 107.956 kg (238 lb)   SpO2:  95%   Blood pressure 127/75, pulse 93, temperature 97.8 F (36.6 C), temperature source Oral, resp. rate 20, height 6\' 2"  (1.88 m), weight 107.956 kg (238 lb), SpO2 95.00%.  GEN:  Pleasant overweight middle-aged Caucasian gentleman lying in the stretcher in no acute distress; cooperative with exam PSYCH:  alert and oriented x4; does not appear anxious or depressed; affect is appropriate. HEENT: Mucous membranes pink and anicteric; PERRLA; EOM intact; no cervical lymphadenopathy nor thyromegaly or carotid bruit; no JVD; Breasts:: Not examined CHEST WALL: No tenderness CHEST: Normal respiration, clear to auscultation bilaterally HEART: Regular rate and rhythm; no murmurs rubs or gallops BACK: No kyphosis or scoliosis; no CVA tenderness ABDOMEN: Obese, distended, tympanitic; diffuse mild tenderness but severe tenderness in the right lower quadrant no masses, no organomegaly, generalized tinkling bowel sounds; no pannus; no intertriginous candida. Rectal Exam: Not done EXTREMITIES: age-appropriate arthropathy of the hands and knees; no edema; no ulcerations. Genitalia: not examined PULSES: 2+ and symmetric SKIN: Normal hydration no rash or ulceration CNS: Cranial nerves 2-12 grossly intact no focal lateralizing neurologic deficit   Labs on Admission:  Basic Metabolic Panel:  Lab 06/13/12 8295  NA 142  K 4.1  CL 102  CO2 29  GLUCOSE 122*  BUN 20  CREATININE 1.17  CALCIUM 10.1  MG --  PHOS --   Liver Function Tests:  Lab 06/13/12 2219  AST 21  ALT 16  ALKPHOS 75  BILITOT 0.9    PROT 7.9  ALBUMIN 4.3    Lab 06/13/12 2219  LIPASE 30  AMYLASE --   No results found for this basename: AMMONIA:5 in the last 168 hours CBC:  Lab 06/13/12 2219  WBC 7.9  NEUTROABS 6.8  HGB 15.8  HCT 46.5  MCV 93.8  PLT 167   Cardiac Enzymes: No results found for this basename: CKTOTAL:5,CKMB:5,CKMBINDEX:5,TROPONINI:5 in the last 168 hours BNP: No components found with this basename: POCBNP:5 CBG: No results found for this basename: GLUCAP:5 in the last 168 hours  Results for orders placed during the hospital encounter of 06/13/12 (from the past 48 hour(s))  CBC WITH DIFFERENTIAL     Status: Abnormal   Collection Time   06/13/12 10:19 PM      Component Value Range Comment   WBC 7.9  4.0 - 10.5 K/uL    RBC 4.96  4.22 - 5.81 MIL/uL    Hemoglobin 15.8  13.0 - 17.0 g/dL    HCT 62.1  30.8 - 65.7 %    MCV 93.8  78.0 - 100.0 fL    MCH 31.9  26.0 - 34.0 pg    MCHC 34.0  30.0 -  36.0 g/dL    RDW 96.0  45.4 - 09.8 %    Platelets 167  150 - 400 K/uL    Neutrophils Relative 86 (*) 43 - 77 %    Neutro Abs 6.8  1.7 - 7.7 K/uL    Lymphocytes Relative 9 (*) 12 - 46 %    Lymphs Abs 0.7  0.7 - 4.0 K/uL    Monocytes Relative 5  3 - 12 %    Monocytes Absolute 0.4  0.1 - 1.0 K/uL    Eosinophils Relative 0  0 - 5 %    Eosinophils Absolute 0.0  0.0 - 0.7 K/uL    Basophils Relative 0  0 - 1 %    Basophils Absolute 0.0  0.0 - 0.1 K/uL   COMPREHENSIVE METABOLIC PANEL     Status: Abnormal   Collection Time   06/13/12 10:19 PM      Component Value Range Comment   Sodium 142  135 - 145 mEq/L    Potassium 4.1  3.5 - 5.1 mEq/L    Chloride 102  96 - 112 mEq/L    CO2 29  19 - 32 mEq/L    Glucose, Bld 122 (*) 70 - 99 mg/dL    BUN 20  6 - 23 mg/dL    Creatinine, Ser 1.19  0.50 - 1.35 mg/dL    Calcium 14.7  8.4 - 10.5 mg/dL    Total Protein 7.9  6.0 - 8.3 g/dL    Albumin 4.3  3.5 - 5.2 g/dL    AST 21  0 - 37 U/L    ALT 16  0 - 53 U/L    Alkaline Phosphatase 75  39 - 117 U/L    Total  Bilirubin 0.9  0.3 - 1.2 mg/dL    GFR calc non Af Amer 63 (*) >90 mL/min    GFR calc Af Amer 73 (*) >90 mL/min   LIPASE, BLOOD     Status: Normal   Collection Time   06/13/12 10:19 PM      Component Value Range Comment   Lipase 30  11 - 59 U/L   URINALYSIS, ROUTINE W REFLEX MICROSCOPIC     Status: Abnormal   Collection Time   06/13/12 11:37 PM      Component Value Range Comment   Color, Urine AMBER (*) YELLOW BIOCHEMICALS MAY BE AFFECTED BY COLOR   APPearance CLEAR  CLEAR    Specific Gravity, Urine >1.030 (*) 1.005 - 1.030    pH 6.0  5.0 - 8.0    Glucose, UA NEGATIVE  NEGATIVE mg/dL    Hgb urine dipstick NEGATIVE  NEGATIVE    Bilirubin Urine NEGATIVE  NEGATIVE    Ketones, ur TRACE (*) NEGATIVE mg/dL    Protein, ur TRACE (*) NEGATIVE mg/dL    Urobilinogen, UA 0.2  0.0 - 1.0 mg/dL    Nitrite NEGATIVE  NEGATIVE    Leukocytes, UA NEGATIVE  NEGATIVE   URINE MICROSCOPIC-ADD ON     Status: Abnormal   Collection Time   06/13/12 11:37 PM      Component Value Range Comment   Squamous Epithelial / LPF RARE  RARE    WBC, UA 3-6  <3 WBC/hpf    RBC / HPF 3-6  <3 RBC/hpf    Bacteria, UA FEW (*) RARE      Radiological Exams on Admission: Ct Abdomen Pelvis W Contrast  06/14/2012  *RADIOLOGY REPORT*  Clinical Data: Constipation.  Emesis.  Abdominal pain.  History of stones, hypertension, MI, cardiac stents  CT ABDOMEN AND PELVIS WITH CONTRAST  Technique:  Multidetector CT imaging of the abdomen and pelvis was performed following the standard protocol during bolus administration of intravenous contrast.  Contrast: OMNIPAQUE IOHEXOL 300 MG/ML  SOLN  Comparison: Ultrasound 02/24/2010, CT 09/02/2007  Findings: The lung bases are unremarkable in appearance.  The heart is enlarged.  There are coronary artery calcifications and coronary stents.  No focal abnormality identified within the liver, spleen, pancreas, or adrenal glands.  Low attenuation lesions are identified within the kidneys, most  consistent with cysts.  Bilateral intrarenal calculi identified.  No ureteral stones are present.  The stomach has a normal appearance.  Multiple dilated small bowel loops are identified.  The distal small bowel loops are normal in caliber however.  Transition zone is identified within a right lower quadrant small bowel loop, best seen on image number 70.  There is free pelvic fluid.  Multiple colonic diverticula are identified.  No CT evidence for acute diverticulitis however. The appendix is well seen and has a normal appearance.   Small infrarenal abdominal aortic aneurysm is 3.1 cm in diameter.  There are mild degenerative changes in the lumbar spine.  IMPRESSION:  1.  Small bowel obstruction, best localized to the right lower quadrant. 2.  Free pelvic fluid. 3.  Bilateral renal cysts and intrarenal calculi.  Original Report Authenticated By: Patterson Hammersmith, M.D.    Assessment/Plan Present on Admission:  .SBO (small bowel obstruction) .CORONARY ATHEROSCLEROSIS NATIVE CORONARY ARTERY .Hyperlipemia .Hypertension .Obesity, unspecified .HYPERLIPIDEMIA   PLAN: We'll admit this gentleman for conservative management of small bowel obstruction; since he has vomited only 4 times and is currently comfortable, we'll attempt to manage him without an NG tube initially, but will keep him n.p.o. we'll give IV fluids, and given one dose of a motility agent. It is possible that this is food poisoning masquerading as small bowel obstruction, but we are concerned because the area of maximal tenderness in the right lower quadrant corresponds to the area of cutoff noted on the CAT scan.  We'll consult surgical service for assistance with management.  Will give d when necessary antihypertensives while he is n.p.o., But will need to completely discontinue his antiplatelet agents and Crestor; will give Lovenox for DVT prophylaxis since he's unlikely to be going to surgery within the next 24 hrs.  Other plans as  per orders.   Code Status: FULL CODE Family Communication: Wife, Dylan [name] -cell phone [phone], present at the interview and examination and discussion of plan Disposition Plan: As above    Arihanna Estabrook Nocturnist [facility] Pager [phone]  06/14/2012, 1:40 AM

## 2012-06-14 NOTE — ED Notes (Signed)
MD at bedside. 

## 2012-06-15 ENCOUNTER — Inpatient Hospital Stay (HOSPITAL_COMMUNITY): Payer: Medicare Other

## 2012-06-15 DIAGNOSIS — I2510 Atherosclerotic heart disease of native coronary artery without angina pectoris: Secondary | ICD-10-CM

## 2012-06-15 MED ORDER — SODIUM CHLORIDE 0.9 % IJ SOLN
INTRAMUSCULAR | Status: AC
  Administered 2012-06-15: 3 mL
  Filled 2012-06-15: qty 3

## 2012-06-15 MED ORDER — DOCUSATE SODIUM 100 MG PO CAPS: 100.0000 mg | ORAL_CAPSULE | Freq: Two times a day (BID) | ORAL | Status: AC

## 2012-06-15 MED ORDER — POLYETHYLENE GLYCOL 3350 17 GM/SCOOP PO POWD: 17.0000 g | Freq: Every day | ORAL | Status: AC

## 2012-06-15 NOTE — Progress Notes (Signed)
Subjective: Tolerating liquids. No nausea. Pain is better. Having bowel function.  Objective: Vital signs in last 24 hours: Temp:  [98.3 F (36.8 C)-99 F (37.2 C)] 99 F (37.2 C) (07/31 0449) Pulse Rate:  [66-76] 70  (07/31 0449) Resp:  [16] 16  (07/31 0449) BP: (138-161)/(76-88) 159/81 mmHg (07/31 0449) SpO2:  [94 %-98 %] 95 % (07/31 0449) Weight:  [104.645 kg (230 lb 11.2 oz)] 104.645 kg (230 lb 11.2 oz) (07/31 0649) Last BM Date: 06/15/12  Intake/Output from previous day: 07/30 0701 - 07/31 0700 In: 2625 [I.V.:2625] Out: 2 [Urine:2] Intake/Output this shift:    General appearance: alert and no distress GI: Soft mild diffuse tenderness. No peritoneal signs. No masses or hernias. Positive bowel sounds.  Lab Results:   Basename 06/14/12 0524 06/13/12 2219  WBC 6.5 7.9  HGB 14.2 15.8  HCT 42.2 46.5  PLT 132* 167   BMET  Basename 06/14/12 0524 06/13/12 2219  NA 142 142  K 4.6 4.1  CL 102 102  CO2 29 29  GLUCOSE 118* 122*  BUN 20 20  CREATININE 1.14 1.17  CALCIUM 9.4 10.1   PT/INR No results found for this basename: LABPROT:2,INR:2 in the last 72 hours ABG No results found for this basename: PHART:2,PCO2:2,PO2:2,HCO3:2 in the last 72 hours  Studies/Results: Abd 1 View (kub)  06/15/2012  *RADIOLOGY REPORT*  Clinical Data: Small bowel obstruction  ABDOMEN - 1 VIEW  Comparison: CT scan 06/13/2012  Findings: Gaseous distended small bowel loops are noted mid abdomen suspicious for at least partial small bowel obstruction.  Contrast material from recent CT scan noted in distal colon.  IMPRESSION: Gaseous distended small bowel loops mid abdomen consistent with partial small bowel obstruction.  Original Report Authenticated By: Natasha Mead, M.D.   Ct Abdomen Pelvis W Contrast  06/14/2012  *RADIOLOGY REPORT*  Clinical Data: Constipation.  Emesis.  Abdominal pain.  History of stones, hypertension, MI, cardiac stents  CT ABDOMEN AND PELVIS WITH CONTRAST  Technique:   Multidetector CT imaging of the abdomen and pelvis was performed following the standard protocol during bolus administration of intravenous contrast.  Contrast: OMNIPAQUE IOHEXOL 300 MG/ML  SOLN  Comparison: Ultrasound 02/24/2010, CT 09/02/2007  Findings: The lung bases are unremarkable in appearance.  The heart is enlarged.  There are coronary artery calcifications and coronary stents.  No focal abnormality identified within the liver, spleen, pancreas, or adrenal glands.  Low attenuation lesions are identified within the kidneys, most consistent with cysts.  Bilateral intrarenal calculi identified.  No ureteral stones are present.  The stomach has a normal appearance.  Multiple dilated small bowel loops are identified.  The distal small bowel loops are normal in caliber however.  Transition zone is identified within a right lower quadrant small bowel loop, best seen on image number 70.  There is free pelvic fluid.  Multiple colonic diverticula are identified.  No CT evidence for acute diverticulitis however. The appendix is well seen and has a normal appearance.   Small infrarenal abdominal aortic aneurysm is 3.1 cm in diameter.  There are mild degenerative changes in the lumbar spine.  IMPRESSION:  1.  Small bowel obstruction, best localized to the right lower quadrant. 2.  Free pelvic fluid. 3.  Bilateral renal cysts and intrarenal calculi.  Original Report Authenticated By: Patterson Hammersmith, M.D.    Anti-infectives: Anti-infectives    None      Assessment/Plan: s/p * No surgery found * Partial small bowel obstruction versus ileus. Patient continues  to have improve bowel function. Continue to advance diet as tolerated. No acute surgical indications. If patient tolerates regular diet and is comfortable consider discharge in the next 24 hours.  LOS: 2 days    Rue Valladares C 06/15/2012

## 2012-06-15 NOTE — Progress Notes (Signed)
Patient given discharge instructions with no questions. Understands to follow up with PCP. Patient ambulated out of facility with staff.

## 2012-06-15 NOTE — Care Management Note (Unsigned)
    Page 1 of 1   06/15/2012     11:51:40 AM   CARE MANAGEMENT NOTE 06/15/2012  Patient:  BARTLEY, VUOLO   Account Number:  [id number]  Date Initiated:  06/15/2012  Documentation initiated by:  Rosemary Holms  Subjective/Objective Assessment:   Pt admitted from home with spouse. Admitted with sm bowel obstruction, nausea vomitting.     Action/Plan:   Spoke to pt at bedside. Eager to go home.   Anticipated DC Date:  06/15/2012   Anticipated DC Plan:  HOME/SELF CARE      DC Planning Services  CM consult      Choice offered to / List presented to:             Status of service:  In process, will continue to follow Medicare Important Message given?   (If response is "NO", the following Medicare IM given date fields will be blank) Date Medicare IM given:   Date Additional Medicare IM given:    Discharge Disposition:    Per UR Regulation:    If discussed at Long Length of Stay Meetings, dates discussed:    Comments:  06/15/12 1130 Tyreshia Ingman Leanord Hawking RN BSN CM

## 2012-06-15 NOTE — Discharge Summary (Signed)
Physician Discharge Summary  Dylan [name]:[id number] DOB: [birth year] DOA: 06/13/2012  PCP: Donzetta Sprung, MD  Admit date: 06/13/2012 Discharge date: 06/15/2012  Recommendations for Outpatient Follow-up:  1. Follow up with primary doctor in 2 weeks  Discharge Diagnoses:  Principal Problem:  *SBO (small bowel obstruction) Active Problems:  HYPERLIPIDEMIA  Obesity, unspecified  CORONARY ATHEROSCLEROSIS NATIVE CORONARY ARTERY  Hypertension   Discharge Condition: improved   Diet recommendation: low salt  Wt Readings from Last 3 Encounters:  06/15/12 104.645 kg (230 lb 11.2 oz)  01/12/11 105.802 kg (233 lb 4 oz)  05/16/10 102.513 kg (226 lb)    History of present illness:  Dylan [name] is an 66 y.o. male. Caucasian gentleman with a history of hypertension hyperlipidemia and coronary artery disease was in good health until he had a very heavy meal last night and woke this morning drank some coffee, when to have his normal bowel movement but noticed that it is very loose watery. Then he vomited copious amounts of melon that he'd eaten the night before. Since then he's been having vomiting, abdominal pain and tightness. He feels constipated and has had no further bowel movements, but feels he's passed a minimal amount of gas. He is not noted in the frank swelling of the abdomen.   Hospital Course:  Patient was admitted to the hospital with complaints of vomiting and abdominal pain. He was noted have a partial small bowel obstruction on imaging. Patient was kept n.p.o. He was treated conservatively. Surgical consultation was provided by Dr. Suzette Battiest. Fortunately the patient improved with conservative management. He is passing flatus, having bowel movements. He's had a total of 4 bowel movements today. His nausea and vomiting has resolved. He is tolerating a solid diet. He is eager to return home and is requesting discharge. Patient will be placed on a bowel regimen.  The remainder of  his medical problems remained stable.  Procedures:  none  Consultations:  General Surgery, Dr. Suzette Battiest  Discharge Exam: Filed Vitals:   06/15/12 0449  BP: 159/81  Pulse: 70  Temp: 99 F (37.2 C)  Resp: 16   Filed Vitals:   06/14/12 1807 06/14/12 2225 06/15/12 0449 06/15/12 0649  BP: 154/88 161/82 159/81   Pulse: 76 66 70   Temp: 98.3 F (36.8 C) 99 F (37.2 C) 99 F (37.2 C)   TempSrc: Oral Oral Oral   Resp: 16 16 16    Height:      Weight:    104.645 kg (230 lb 11.2 oz)  SpO2: 96% 94% 95%     General: No acute distress Cardiovascular: S1, S2, regular rate and rhythm Respiratory: Clear to auscultation bilaterally Abdomen: Soft, nontender, bowel sounds active  Discharge Instructions  Discharge Orders    Future Orders Please Complete By Expires   Diet - low sodium heart healthy      Increase activity slowly        Medication List  As of 06/15/2012  2:34 PM   STOP taking these medications         ibuprofen 200 MG tablet         TAKE these medications         aspirin EC 81 MG tablet   Take 81 mg by mouth every morning.      CRESTOR 20 MG tablet   Generic drug: rosuvastatin   TAKE ONE TABLET BY MOUTH EVERY DAY      docusate sodium 100 MG capsule   Commonly known as:  COLACE   Take 1 capsule (100 mg total) by mouth 2 (two) times daily.      GLUCOSAMINE HCL PO   Take 2,000 mg by mouth every morning.      lisinopril 10 MG tablet   Commonly known as: PRINIVIL,ZESTRIL   TAKE ONE TABLET BY MOUTH EVERY DAY      nitroGLYCERIN 0.4 MG SL tablet   Commonly known as: NITROSTAT   DISSOLVE ONE TABLET UNDER THE TONGUE EVERY 5 MINUTES AS NEEDED FOR CHEST PAIN.  DO NOT EXCEED A TOTAL OF 3 DOSES IN 15 MINUTES      pantoprazole 40 MG tablet   Commonly known as: PROTONIX   Take 40 mg by mouth every morning.      PLAVIX 75 MG tablet   Generic drug: clopidogrel   TAKE ONE TABLET BY MOUTH EVERY DAY WITH FOOD      polyethylene glycol powder powder   Commonly  known as: GLYCOLAX/MIRALAX   Take 17 g by mouth daily.      POTASSIUM GLUCONATE PO   Take 1 tablet by mouth every morning.              The results of significant diagnostics from this hospitalization (including imaging, microbiology, ancillary and laboratory) are listed below for reference.    Significant Diagnostic Studies: Abd 1 View (kub)  06/15/2012  *RADIOLOGY REPORT*  Clinical Data: Small bowel obstruction  ABDOMEN - 1 VIEW  Comparison: CT scan 06/13/2012  Findings: Gaseous distended small bowel loops are noted mid abdomen suspicious for at least partial small bowel obstruction.  Contrast material from recent CT scan noted in distal colon.  IMPRESSION: Gaseous distended small bowel loops mid abdomen consistent with partial small bowel obstruction.  Original Report Authenticated By: Natasha Mead, M.D.   Ct Abdomen Pelvis W Contrast  06/14/2012  *RADIOLOGY REPORT*  Clinical Data: Constipation.  Emesis.  Abdominal pain.  History of stones, hypertension, MI, cardiac stents  CT ABDOMEN AND PELVIS WITH CONTRAST  Technique:  Multidetector CT imaging of the abdomen and pelvis was performed following the standard protocol during bolus administration of intravenous contrast.  Contrast: OMNIPAQUE IOHEXOL 300 MG/ML  SOLN  Comparison: Ultrasound 02/24/2010, CT 09/02/2007  Findings: The lung bases are unremarkable in appearance.  The heart is enlarged.  There are coronary artery calcifications and coronary stents.  No focal abnormality identified within the liver, spleen, pancreas, or adrenal glands.  Low attenuation lesions are identified within the kidneys, most consistent with cysts.  Bilateral intrarenal calculi identified.  No ureteral stones are present.  The stomach has a normal appearance.  Multiple dilated small bowel loops are identified.  The distal small bowel loops are normal in caliber however.  Transition zone is identified within a right lower quadrant small bowel loop, best seen on  image number 70.  There is free pelvic fluid.  Multiple colonic diverticula are identified.  No CT evidence for acute diverticulitis however. The appendix is well seen and has a normal appearance.   Small infrarenal abdominal aortic aneurysm is 3.1 cm in diameter.  There are mild degenerative changes in the lumbar spine.  IMPRESSION:  1.  Small bowel obstruction, best localized to the right lower quadrant. 2.  Free pelvic fluid. 3.  Bilateral renal cysts and intrarenal calculi.  Original Report Authenticated By: Patterson Hammersmith, M.D.    Microbiology: Recent Results (from the past 240 hour(s))  MRSA PCR SCREENING     Status: Normal   Collection Time  06/14/12 11:14 AM      Component Value Range Status Comment   MRSA by PCR NEGATIVE  NEGATIVE Final      Labs: Basic Metabolic Panel:  Lab 06/14/12 8295 06/13/12 2219  NA 142 142  K 4.6 4.1  CL 102 102  CO2 29 29  GLUCOSE 118* 122*  BUN 20 20  CREATININE 1.14 1.17  CALCIUM 9.4 10.1  MG -- --  PHOS -- --   Liver Function Tests:  Lab 06/14/12 0524 06/13/12 2219  AST 16 21  ALT 12 16  ALKPHOS 62 75  BILITOT 0.8 0.9  PROT 7.0 7.9  ALBUMIN 3.6 4.3    Lab 06/13/12 2219  LIPASE 30  AMYLASE --   No results found for this basename: AMMONIA:5 in the last 168 hours CBC:  Lab 06/14/12 0524 06/13/12 2219  WBC 6.5 7.9  NEUTROABS -- 6.8  HGB 14.2 15.8  HCT 42.2 46.5  MCV 94.4 93.8  PLT 132* 167   Cardiac Enzymes: No results found for this basename: CKTOTAL:5,CKMB:5,CKMBINDEX:5,TROPONINI:5 in the last 168 hours BNP: BNP (last 3 results) No results found for this basename: PROBNP:3 in the last 8760 hours CBG: No results found for this basename: GLUCAP:5 in the last 168 hours  Time coordinating discharge: Greater than 30 minutes  Signed:  Marrian Bells  [facility] 06/15/2012, 2:34 PM

## 2012-06-22 ENCOUNTER — Telehealth: Payer: Self-pay | Admitting: Cardiology

## 2012-06-22 NOTE — Telephone Encounter (Signed)
PATIENT WALK IN - requesting that we schedule GXT per DOT request.  Vicky and I tried to explain to this patient that we needed an order and he would have to see a doctor. He told us never mind and left the office.

## 2012-07-27 ENCOUNTER — Ambulatory Visit: Payer: Medicare Other | Admitting: Cardiology

## 2012-08-06 ENCOUNTER — Other Ambulatory Visit: Payer: Self-pay | Admitting: Cardiovascular Disease

## 2012-08-06 ENCOUNTER — Other Ambulatory Visit: Payer: Self-pay | Admitting: Cardiology

## 2012-08-08 NOTE — Telephone Encounter (Signed)
..   Requested Prescriptions   Pending Prescriptions Disp Refills  . CRESTOR 20 MG tablet [Pharmacy Med Name: CRESTOR 20MG         TAB] 30 tablet 1    Sig: TAKE ONE TABLET BY MOUTH EVERY DAY (NEED TO MAKE APPOINTMENT)  .Marland KitchenPatient needs to contact office to schedule  Appointment  for future refills.Ph:[phone]. Thank you.

## 2012-08-10 ENCOUNTER — Other Ambulatory Visit: Payer: Self-pay | Admitting: Cardiovascular Disease

## 2012-08-18 ENCOUNTER — Other Ambulatory Visit: Payer: Self-pay | Admitting: Cardiovascular Disease

## 2012-09-17 ENCOUNTER — Other Ambulatory Visit: Payer: Self-pay | Admitting: Cardiovascular Disease

## 2013-04-01 ENCOUNTER — Encounter (HOSPITAL_COMMUNITY): Payer: Self-pay | Admitting: *Deleted

## 2013-04-01 ENCOUNTER — Inpatient Hospital Stay (HOSPITAL_COMMUNITY)
Admission: EM | Admit: 2013-04-01 | Discharge: 2013-04-03 | DRG: 390 | Disposition: A | Discharge status: Home / Self Care | Payer: Medicare Other | Attending: General Surgery | Admitting: General Surgery

## 2013-04-01 ENCOUNTER — Emergency Department (HOSPITAL_COMMUNITY): Payer: Medicare Other

## 2013-04-01 DIAGNOSIS — E785 Hyperlipidemia, unspecified: Secondary | ICD-10-CM | POA: Diagnosis present

## 2013-04-01 DIAGNOSIS — Z9861 Coronary angioplasty status: Secondary | ICD-10-CM

## 2013-04-01 DIAGNOSIS — E860 Dehydration: Secondary | ICD-10-CM

## 2013-04-01 DIAGNOSIS — Z6830 Body mass index (BMI) 30.0-30.9, adult: Secondary | ICD-10-CM

## 2013-04-01 DIAGNOSIS — Z91041 Radiographic dye allergy status: Secondary | ICD-10-CM

## 2013-04-01 DIAGNOSIS — Z87891 Personal history of nicotine dependence: Secondary | ICD-10-CM

## 2013-04-01 DIAGNOSIS — K56600 Partial intestinal obstruction, unspecified as to cause: Secondary | ICD-10-CM

## 2013-04-01 DIAGNOSIS — Z96659 Presence of unspecified artificial knee joint: Secondary | ICD-10-CM

## 2013-04-01 DIAGNOSIS — I252 Old myocardial infarction: Secondary | ICD-10-CM

## 2013-04-01 DIAGNOSIS — E669 Obesity, unspecified: Secondary | ICD-10-CM | POA: Diagnosis present

## 2013-04-01 DIAGNOSIS — Z91013 Allergy to seafood: Secondary | ICD-10-CM

## 2013-04-01 DIAGNOSIS — I10 Essential (primary) hypertension: Secondary | ICD-10-CM | POA: Diagnosis present

## 2013-04-01 DIAGNOSIS — K56609 Unspecified intestinal obstruction, unspecified as to partial versus complete obstruction: Principal | ICD-10-CM | POA: Diagnosis present

## 2013-04-01 HISTORY — DX: Unspecified intestinal obstruction, unspecified as to partial versus complete obstruction: K56.609

## 2013-04-01 LAB — CBC WITH DIFFERENTIAL/PLATELET
Basophils Absolute: 0 10*3/uL (ref 0.0–0.1)
HCT: 42.2 % (ref 39.0–52.0)
Lymphocytes Relative: 16 % (ref 12–46)
Neutro Abs: 5.5 10*3/uL (ref 1.7–7.7)
Platelets: 213 10*3/uL (ref 150–400)
RBC: 4.53 MIL/uL (ref 4.22–5.81)
RDW: 12.9 % (ref 11.5–15.5)
WBC: 7.1 10*3/uL (ref 4.0–10.5)

## 2013-04-01 LAB — URINALYSIS, ROUTINE W REFLEX MICROSCOPIC
Leukocytes, UA: NEGATIVE
Nitrite: NEGATIVE
Specific Gravity, Urine: >1.03 — ABNORMAL HIGH (ref 1.005–1.030)
Urobilinogen, UA: 0.2 mg/dL (ref 0.0–1.0)

## 2013-04-01 LAB — COMPREHENSIVE METABOLIC PANEL
Alkaline Phosphatase: 88 U/L (ref 39–117)
BUN: 25 mg/dL — ABNORMAL HIGH (ref 6–23)
Creatinine, Ser: 1.21 mg/dL (ref 0.50–1.35)
GFR calc Af Amer: 70 mL/min — ABNORMAL LOW (ref >90)
Glucose, Bld: 126 mg/dL — ABNORMAL HIGH (ref 70–99)
Potassium: 3.3 mEq/L — ABNORMAL LOW (ref 3.5–5.1)
Total Bilirubin: 0.6 mg/dL (ref 0.3–1.2)
Total Protein: 8.1 g/dL (ref 6.0–8.3)

## 2013-04-01 LAB — LACTIC ACID, PLASMA: Lactic Acid, Venous: 1.5 mmol/L (ref 0.5–2.2)

## 2013-04-01 LAB — URINE MICROSCOPIC-ADD ON

## 2013-04-01 LAB — LIPASE, BLOOD: Lipase: 30 U/L (ref 11–59)

## 2013-04-01 MED ORDER — FENTANYL CITRATE 0.05 MG/ML IJ SOLN
50.0000 ug | Freq: Once | INTRAMUSCULAR | Status: AC
  Administered 2013-04-01: 50 ug via INTRAVENOUS
  Filled 2013-04-01: qty 2

## 2013-04-01 MED ORDER — LACTATED RINGERS IV SOLN
INTRAVENOUS | Status: DC
  Administered 2013-04-01 – 2013-04-02 (×2): via INTRAVENOUS

## 2013-04-01 MED ORDER — PANTOPRAZOLE SODIUM 40 MG IV SOLR
40.0000 mg | Freq: Every day | INTRAVENOUS | Status: DC
  Administered 2013-04-01 – 2013-04-02 (×2): 40 mg via INTRAVENOUS
  Filled 2013-04-01 (×2): qty 40

## 2013-04-01 MED ORDER — ONDANSETRON HCL 4 MG/2ML IJ SOLN: 4.0000 mg | Freq: Four times a day (QID) | INTRAMUSCULAR | Status: DC | PRN

## 2013-04-01 MED ORDER — ENOXAPARIN SODIUM 40 MG/0.4ML ~~LOC~~ SOLN
40.0000 mg | SUBCUTANEOUS | Status: DC
  Administered 2013-04-01: 40 mg via SUBCUTANEOUS
  Filled 2013-04-01: qty 0.4

## 2013-04-01 MED ORDER — ONDANSETRON HCL 4 MG/2ML IJ SOLN
4.0000 mg | Freq: Once | INTRAMUSCULAR | Status: AC
  Administered 2013-04-01: 4 mg via INTRAVENOUS
  Filled 2013-04-01: qty 2

## 2013-04-01 MED ORDER — HYDROMORPHONE HCL PF 1 MG/ML IJ SOLN: 1.0000 mg | INTRAMUSCULAR | Status: DC | PRN

## 2013-04-01 MED ORDER — SODIUM CHLORIDE 0.9 % IV SOLN
Freq: Once | INTRAVENOUS | Status: AC
  Administered 2013-04-01: 11:00:00 via INTRAVENOUS

## 2013-04-01 NOTE — ED Provider Notes (Signed)
History  This chart was scribed for Joya Gaskins, MD by Shari Heritage, ED Scribe. The patient was seen in room APA06/APA06. Patient's care was started at 0910.   CSN: [id number]  Arrival date & time 04/01/13  0905   First MD Initiated Contact with Patient 04/01/13 [phone]      Chief Complaint  Patient presents with  . Abdominal Pain    The history is provided by the patient. No language interpreter was used.    HPI Comments: Dylan [name] is a 67 y.o. male who presents to the Emergency Department complaining of moderate, constant, diffuse abdominal pain onset yesterday. There is associated nausea and diarrhea. He states that he has had increased belching, but no flatus. Patient denies vomiting, chest pain, shortness of breath, fever, extremity weakness, back pain or any other symptoms.  Patient states a history of bowel obstruction and is attributing current pain to this issue. He says that no cause was identified for past bowel obstructions. Patient has a history of kidney surgery. His other medical history includes AAA, acute MI, hypertension, hyperlipemia and kidney stones.   Past Medical History  Diagnosis Date  . 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 Kidney stone   . Hypertension   . Hyperlipemia   . Bowel obstruction     Past Surgical History  Procedure Laterality Date  . Kidney surgery    . Stents    . Coronary angioplasty with stent placement  10/2009  . Total knee arthroplasty      No family history on file.  History  Substance Use Topics  . Smoking status: Former Smoker -- 1.00 packs/day for 40 years    Quit date: 06/14/2002  . Smokeless tobacco: Not on file  . Alcohol Use: No      Review of Systems A complete 10 system review of systems was obtained and all systems are negative except as noted in the HPI and PMH.   Allergies  Fish allergy; Iodine; and Shrimp  Home Medications   Current Outpatient Rx  Name  Route  Sig  Dispense  Refill  . aspirin EC 81 MG tablet   Oral   Take 81 mg by mouth every morning.         . clopidogrel (PLAVIX) 75 MG tablet      TAKE ONE TABLET BY MOUTH EVERY DAY WITH FOOD   15 tablet   0     Pt needs to make appt   . CRESTOR 20 MG tablet      TAKE ONE TABLET BY MOUTH EVERY DAY (NEED TO MAKE APPOINTMENT)   30 tablet   1   . GLUCOSAMINE HCL PO   Oral   Take 2,000 mg by mouth every morning.         Marland Kitchen lisinopril (PRINIVIL,ZESTRIL) 10 MG tablet      TAKE ONE TABLET BY MOUTH EVERY DAY   30 tablet   10   . nitroGLYCERIN (NITROSTAT) 0.4 MG SL tablet      DISSOLVE ONE TABLET UNDER THE TONGUE EVERY 5 MINUTES AS NEEDED FOR CHEST PAIN.  DO NOT EXCEED A TOTAL OF 3 DOSES IN 15 MINUTES   25 tablet   3     Dispense as written.   . pantoprazole (PROTONIX) 40 MG tablet   Oral   Take 40 mg by mouth every morning.         Marland Kitchen POTASSIUM GLUCONATE PO   Oral   Take 1 tablet by mouth every  morning.           Triage Vitals: BP 153/84  Pulse 82  Temp(Src) 97.6 F (36.4 C) (Oral)  Resp 20  Ht 6\' 2"  (1.88 m)  Wt 235 lb (106.595 kg)  BMI 30.16 kg/m2  SpO2 98%  Physical Exam CONSTITUTIONAL: Well developed/well nourished HEAD: Normocephalic/atraumatic EYES: EOMI/PERRL ENMT: Mucous membranes moist NECK: supple no meningeal signs SPINE:entire spine nontender CV: S1/S2 noted, no murmurs/rubs/gallops noted LUNGS: Lungs are clear to auscultation bilaterally, no apparent distress ABDOMEN: soft, no rebound or guarding, distended, mild diffuse tenderness GU:no cva tenderness, no inguinal hernia, no testicular tenderness (chaperone present) NEURO: Pt is awake/alert, moves all extremitiesx4 EXTREMITIES: pulses normal, full ROM SKIN: warm, color normal PSYCH: no abnormalities of mood noted  ED Course  Procedures  DIAGNOSTIC STUDIES: Oxygen Saturation is 98% on room air, normal by my interpretation.    COORDINATION OF CARE: 9:36 AM- Patient informed of current plan for treatment and evaluation and agrees with  plan at this time.    11:39 AM Partial SBO noted Pt otherwise stable Do not feel CT imaging warranted (has had SBO before) Spoke to dr ziegler who will admit for observation Pt agreeable  Labs Reviewed - No data to display  Dg Abd Acute W/chest  04/01/2013   *RADIOLOGY REPORT*  Clinical Data: Abdominal pain and bloating  ACUTE ABDOMEN SERIES (ABDOMEN 2 VIEW & CHEST 1 VIEW)  Comparison: Chest radiograph 11/28/2012 and CT abdomen pelvis 06/13/2012  Findings: The heart size is normal in stable.  Mediastinal hilar contours are stable and normal.  There is chronic peribronchial thickening.  No focal airspace disease, effusion, or pneumothorax.  No evidence of free intraperitoneal air on the upright view of the chest.  On the upright view of the abdomen, the right hemidiaphragm is not well visualized due to technique.  Some of the small bowel loops in the right abdomen are mildly prominent in caliber and demonstrate air-fluid levels on the upright view.  There is a normal distribution of gas in the colon. No evidence of colonic distention.  The rectum is unremarkable. There is an air-fluid level in the stomach.  IMPRESSION:  1. Mildly distended small bowel loops with air-fluid levels. Findings are suspicious for early / partial small bowel obstruction. 2.  Chronic bronchitic changes.  No acute cardiopulmonary disease.   Original Report Authenticated By: Britta Mccreedy, M.D.       MDM  Nursing notes including past medical history and social history reviewed and considered in documentation xrays reviewed and considered Labs/vital reviewed and considered Previous records reviewed and considered - h/o SBO in the past per records, nonoperative management    Date: 04/01/2013  Rate: 70  Rhythm: normal sinus rhythm  QRS Axis: normal  Intervals: normal  ST/T Wave abnormalities: normal  Conduction Disutrbances:none  Narrative Interpretation:   Old EKG Reviewed: unchanged      I personally  performed the services described in this documentation, which was scribed in my presence. The recorded information has been reviewed and is accurate.      Joya Gaskins, MD 04/01/13 [phone]

## 2013-04-01 NOTE — ED Notes (Signed)
Pt and family notified of Room # on 300 unit, acknowledged plan of care and transfer.

## 2013-04-01 NOTE — ED Notes (Signed)
Abdominal pain onset yesterday , c/o nausea, history of bowel obstruction

## 2013-04-01 NOTE — H&P (Signed)
Dylan [name] is an 67 y.o. male.   Chief Complaint: Nausea vomiting and abdominal pain HPI: Patient presented to [facility] this morning with increasing diffuse abdominal tenderness and nausea. Patient states this is been very similar to a past episode of a small bowel obstruction last year. He states his last normal bowel movement was approximately 2 days ago. No melena or hematochezia. He then developed some diarrhea. Since last 24 hours she's not had any bowel function. No flatus. He has belched a lot. Has had some nausea. No noted emesis. He's had some subjective fevers and chills. No sick contacts.  Past Medical History  Diagnosis Date  . 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 Kidney stone   . Hypertension   . Hyperlipemia   . Bowel obstruction     Past Surgical History  Procedure Laterality Date  . Kidney surgery    . Stents    . Coronary angioplasty with stent placement  10/2009  . Total knee arthroplasty      No family history on file. Social History:  reports that he quit smoking about 10 years ago. He does not have any smokeless tobacco history on file. He reports that he does not drink alcohol or use illicit drugs.  Allergies:  Allergies  Allergen Reactions  . Fish Allergy Nausea And Vomiting  . Iodine Nausea And Vomiting  . Shrimp (Shellfish Allergy) Nausea And Vomiting     (Not in a hospital admission)  Results for orders placed during the hospital encounter of 04/01/13 (from the past 48 hour(s))  URINALYSIS, ROUTINE W REFLEX MICROSCOPIC     Status: Abnormal   Collection Time    04/01/13  9:43 AM      Result Value Range   Color, Urine YELLOW  YELLOW   APPearance HAZY (*) CLEAR   Specific Gravity, Urine >1.030 (*) 1.005 - 1.030   pH 6.0  5.0 - 8.0   Glucose, UA NEGATIVE  NEGATIVE mg/dL   Hgb urine dipstick TRACE (*) NEGATIVE   Bilirubin Urine SMALL (*) NEGATIVE   Ketones, ur TRACE (*) NEGATIVE mg/dL   Protein, ur 30 (*) NEGATIVE mg/dL   Urobilinogen, UA 0.2  0.0  - 1.0 mg/dL   Nitrite NEGATIVE  NEGATIVE   Leukocytes, UA NEGATIVE  NEGATIVE  URINE MICROSCOPIC-ADD ON     Status: Abnormal   Collection Time    04/01/13  9:43 AM      Result Value Range   Squamous Epithelial / LPF RARE  RARE   WBC, UA 0-2  <3 WBC/hpf   RBC / HPF 0-2  <3 RBC/hpf   Bacteria, UA FEW (*) RARE   Casts GRANULAR CAST (*) NEGATIVE   Urine-Other MUCOUS PRESENT    COMPREHENSIVE METABOLIC PANEL     Status: Abnormal   Collection Time    04/01/13 10:15 AM      Result Value Range   Sodium 141  135 - 145 mEq/L   Potassium 3.3 (*) 3.5 - 5.1 mEq/L   Chloride 106  96 - 112 mEq/L   CO2 24  19 - 32 mEq/L   Glucose, Bld 126 (*) 70 - 99 mg/dL   BUN 25 (*) 6 - 23 mg/dL   Creatinine, Ser 1.61  0.50 - 1.35 mg/dL   Calcium 9.2  8.4 - 09.6 mg/dL   Total Protein 8.1  6.0 - 8.3 g/dL   Albumin 4.2  3.5 - 5.2 g/dL   AST 29  0 - 37 U/L  ALT 25  0 - 53 U/L   Alkaline Phosphatase 88  39 - 117 U/L   Total Bilirubin 0.6  0.3 - 1.2 mg/dL   GFR calc non Af Amer 60 (*) >90 mL/min   GFR calc Af Amer 70 (*) >90 mL/min   Comment:            The eGFR has been calculated     using the CKD EPI equation.     This calculation has not been     validated in all clinical     situations.     eGFR's persistently     <90 mL/min signify     possible Chronic Kidney Disease.  CBC WITH DIFFERENTIAL     Status: None   Collection Time    04/01/13 10:15 AM      Result Value Range   WBC 7.1  4.0 - 10.5 K/uL   RBC 4.53  4.22 - 5.81 MIL/uL   Hemoglobin 14.7  13.0 - 17.0 g/dL   HCT 16.1  09.6 - 04.5 %   MCV 93.2  78.0 - 100.0 fL   MCH 32.5  26.0 - 34.0 pg   MCHC 34.8  30.0 - 36.0 g/dL   RDW 40.9  81.1 - 91.4 %   Platelets 213  150 - 400 K/uL   Neutrophils Relative % 77  43 - 77 %   Neutro Abs 5.5  1.7 - 7.7 K/uL   Lymphocytes Relative 16  12 - 46 %   Lymphs Abs 1.1  0.7 - 4.0 K/uL   Monocytes Relative 5  3 - 12 %   Monocytes Absolute 0.4  0.1 - 1.0 K/uL   Eosinophils Relative 2  0 - 5 %    Eosinophils Absolute 0.1  0.0 - 0.7 K/uL   Basophils Relative 1  0 - 1 %   Basophils Absolute 0.0  0.0 - 0.1 K/uL  LIPASE, BLOOD     Status: None   Collection Time    04/01/13 10:15 AM      Result Value Range   Lipase 30  11 - 59 U/L  LACTIC ACID, PLASMA     Status: None   Collection Time    04/01/13 10:20 AM      Result Value Range   Lactic Acid, Venous 1.5  0.5 - 2.2 mmol/L   Dg Abd Acute W/chest  04/01/2013   *RADIOLOGY REPORT*  Clinical Data: Abdominal pain and bloating  ACUTE ABDOMEN SERIES (ABDOMEN 2 VIEW & CHEST 1 VIEW)  Comparison: Chest radiograph 11/28/2012 and CT abdomen pelvis 06/13/2012  Findings: The heart size is normal in stable.  Mediastinal hilar contours are stable and normal.  There is chronic peribronchial thickening.  No focal airspace disease, effusion, or pneumothorax.  No evidence of free intraperitoneal air on the upright view of the chest.  On the upright view of the abdomen, the right hemidiaphragm is not well visualized due to technique.  Some of the small bowel loops in the right abdomen are mildly prominent in caliber and demonstrate air-fluid levels on the upright view.  There is a normal distribution of gas in the colon. No evidence of colonic distention.  The rectum is unremarkable. There is an air-fluid level in the stomach.  IMPRESSION:  1. Mildly distended small bowel loops with air-fluid levels. Findings are suspicious for early / partial small bowel obstruction. 2.  Chronic bronchitic changes.  No acute cardiopulmonary disease.   Original Report Authenticated By: Darl Pikes  Mayford Knife, M.D.    Review of Systems  Constitutional: Positive for chills and diaphoresis.  HENT: Negative.   Eyes: Negative.   Respiratory: Negative.   Cardiovascular: Negative.   Gastrointestinal: Positive for heartburn, nausea, vomiting, abdominal pain (Diffuse) and diarrhea (none over the last 24 hours). Negative for constipation, blood in stool and melena.  Genitourinary: Negative.    Musculoskeletal: Negative.   Skin: Negative.   Neurological: Negative.   Endo/Heme/Allergies: Negative.   Psychiatric/Behavioral: Negative.     Blood pressure 130/62, pulse 73, temperature 97.6 F (36.4 C), temperature source Oral, resp. rate 20, height 6\' 2"  (1.88 m), weight 106.595 kg (235 lb), SpO2 95.00%. Physical Exam  Constitutional: He is oriented to person, place, and time. He appears well-developed and well-nourished. No distress.  Obese  HENT:  Head: Normocephalic and atraumatic.  Eyes: Conjunctivae and EOM are normal. Pupils are equal, round, and reactive to light. No scleral icterus.  Neck: Normal range of motion. Neck supple. No tracheal deviation present. No thyromegaly present.  Cardiovascular: Normal rate, regular rhythm and normal heart sounds.   Respiratory: Effort normal and breath sounds normal. No respiratory distress.  GI: Soft. He exhibits distension (moderate). He exhibits no mass. There is tenderness (mild diffuse, no peritoneal signs). There is no rebound and no guarding.  Hyperactive bowel sounds, obese  Lymphadenopathy:    He has no cervical adenopathy.  Neurological: He is alert and oriented to person, place, and time.  Skin: Skin is warm and dry.     Assessment/Plan Small bowel obstruction. At this point we'll continue the patient n.p.o. status. Continue IV fluid hydration. Continue bowel rest. Continue DVT prophylaxis. Indications for surgical intervention have been discussed with the patient. Patient states understanding and is in agreement to the plan.  Annesha Delgreco C 04/01/2013, 1:15 PM

## 2013-04-01 NOTE — ED Notes (Signed)
Pt reports increased nausea and pain EDP notified.

## 2013-04-02 LAB — BASIC METABOLIC PANEL
BUN: 26 mg/dL — ABNORMAL HIGH (ref 6–23)
CO2: 27 mEq/L (ref 19–32)
Chloride: 106 mEq/L (ref 96–112)
Creatinine, Ser: 1.19 mg/dL (ref 0.50–1.35)
GFR calc Af Amer: 71 mL/min — ABNORMAL LOW (ref >90)
Potassium: 3.5 mEq/L (ref 3.5–5.1)

## 2013-04-02 LAB — CBC
HCT: 38.3 % — ABNORMAL LOW (ref 39.0–52.0)
Hemoglobin: 13.2 g/dL (ref 13.0–17.0)
MCHC: 34.5 g/dL (ref 30.0–36.0)
MCV: 93.2 fL (ref 78.0–100.0)
RDW: 12.9 % (ref 11.5–15.5)

## 2013-04-02 NOTE — Progress Notes (Signed)
  Subjective: "feels better".  No nausea.  No abdominal pain.  +Flatus. +bm although loose.  Objective: Vital signs in last 24 hours: Temp:  [97.6 F (36.4 [name])-98.7 F (37.1 [name])] 98.7 F (37.1 [name]) (05/18 2121) Pulse Rate:  [53-63] 63 (05/18 2121) Resp:  [14-18] 18 (05/18 2121) BP: (113-142)/(56-78) 142/78 mmHg (05/18 2121) SpO2:  [95 %-96 %] 96 % (05/18 2121) Last BM Date: 04/02/13  Intake/Output from previous day: 05/17 0701 - 05/18 0700 In: 1463 [I.V.:1463] Out: -  Intake/Output this shift:    General appearance: alert and no distress GI: soft, non-tender; bowel sounds normal; no masses,  no organomegaly  Lab Results:   Recent Labs  04/01/13 1015 04/02/13 0743  WBC 7.1 6.0  HGB 14.7 13.2  HCT 42.2 38.3*  PLT 213 159   BMET  Recent Labs  04/01/13 1015 04/02/13 0743  NA 141 140  K 3.3* 3.5  CL 106 106  CO2 24 27  GLUCOSE 126* 95  BUN 25* 26*  CREATININE 1.21 1.19  CALCIUM 9.2 8.3*   PT/INR No results found for this basename: LABPROT, INR,  in the last 72 hours ABG No results found for this basename: PHART, PCO2, PO2, HCO3,  in the last 72 hours  Studies/Results: Dg Abd Acute W/chest  04/01/2013   *RADIOLOGY REPORT*  Clinical Data: Abdominal pain and bloating  ACUTE ABDOMEN SERIES (ABDOMEN 2 VIEW & CHEST 1 VIEW)  Comparison: Chest radiograph 11/28/2012 and CT abdomen pelvis 06/13/2012  Findings: The heart size is normal in stable.  Mediastinal hilar contours are stable and normal.  There is chronic peribronchial thickening.  No focal airspace disease, effusion, or pneumothorax.  No evidence of free intraperitoneal air on the upright view of the chest.  On the upright view of the abdomen, the right hemidiaphragm is not well visualized due to technique.  Some of the small bowel loops in the right abdomen are mildly prominent in caliber and demonstrate air-fluid levels on the upright view.  There is a normal distribution of gas in the colon. No evidence of colonic  distention.  The rectum is unremarkable. There is an air-fluid level in the stomach.  IMPRESSION:  1. Mildly distended small bowel loops with air-fluid levels. Findings are suspicious for early / partial small bowel obstruction. 2.  Chronic bronchitic changes.  No acute cardiopulmonary disease.   Original Report Authenticated By: Britta Mccreedy, M.D.    Anti-infectives: Anti-infectives   None      Assessment/Plan: s/p * No surgery found * SBO.  Appears to be resolving.  Will advance diet as tolerated.  Hopeful d/[name] in next 24-48 hours pending diet advancement.    LOS: 1 day    Dylan [name] 04/02/2013

## 2013-04-03 NOTE — Discharge Summary (Signed)
Physician Discharge Summary  Patient ID: Dylan [name] MRN: [id number] DOB/AGE: April 17, 1946 67 y.o.  Admit date: 04/01/2013 Discharge date: 04/03/2013  Admission Diagnoses: Small bowel obstruction  Discharge Diagnoses: The same Active Problems:   * No active hospital problems. *   Discharged Condition: stable  Hospital Course: Patient presented with increasing nausea vomiting abdominal discomfort. Workup and evaluation was consistent for a small bowel obstruction. He was treated conservatively by rest and IV fluid hydration. He did begin passing flatus and had improvement of his nausea vomiting. With return of normal bowel function he was advanced on his diet. He is tolerating a regular diet. Plans are made for discharge.  Consults: None  Significant Diagnostic Studies: radiology: CT scan: Abdomen and pelvis  Treatments: IV hydration  Discharge Exam: Blood pressure 117/56, pulse 57, temperature 98.5 F (36.9 C), temperature source Oral, resp. rate 16, height 6\' 2"  (1.88 m), weight 106.595 kg (235 lb), SpO2 95.00%. General appearance: alert and no distress Resp: clear to auscultation bilaterally Cardio: regular rate and rhythm GI: soft, non-tender; bowel sounds normal; no masses,  no organomegaly  Disposition: 01-Home or Self Care     Medication List    ASK your doctor about these medications       aspirin EC 81 MG tablet  Take 81 mg by mouth every morning.     clopidogrel 75 MG tablet  Commonly known as:  PLAVIX  Take 75 mg by mouth daily.     fish oil-omega-3 fatty acids 1000 MG capsule  Take 1 g by mouth daily.     GLUCOSAMINE HCL PO  Take 2,000 mg by mouth every morning.     lisinopril 10 MG tablet  Commonly known as:  PRINIVIL,ZESTRIL  Take 10 mg by mouth daily.     nitroGLYCERIN 0.4 MG SL tablet  Commonly known as:  NITROSTAT  DISSOLVE ONE TABLET UNDER THE TONGUE EVERY 5 MINUTES AS NEEDED FOR CHEST PAIN.  DO NOT EXCEED A TOTAL OF 3 DOSES IN 15 MINUTES     pantoprazole 40 MG tablet  Commonly known as:  PROTONIX  Take 40 mg by mouth every morning.     POTASSIUM GLUCONATE PO  Take 1 tablet by mouth every morning.     rosuvastatin 20 MG tablet  Commonly known as:  CRESTOR  Take 20 mg by mouth daily.         SignedFabio Bering 04/03/2013, 10:11 AM

## 2013-04-03 NOTE — Progress Notes (Signed)
Patient discharged home with instructions given on medications,and follow up visits,patient verbalized understanding.No c/o pain or discomfort noted.Vital signs stable.Patient excited about going home.Accompanied by staff to an awaiting vehicle

## 2013-04-03 NOTE — Plan of Care (Signed)
Problem: Discharge Progression Outcomes Goal: Activity appropriate for discharge plan Outcome: Completed/Met Date Met:  04/03/13 Patient ambulating in hallway without difficulty.

## 2013-04-03 NOTE — Care Management Note (Signed)
    Page 1 of 1   04/03/2013     3:20:46 PM   CARE MANAGEMENT NOTE 04/03/2013  Patient:  Dylan [name], Dylan [name]   Account Number:  [id number]  Date Initiated:  04/03/2013  Documentation initiated by:  Sharrie Rothman  Subjective/Objective Assessment:   Pt admitted from home with SBO. Pt lives with his wife and will return home at discharge. Pt is independent with ADL's.     Action/Plan:   No CM needs noted. Pt discharged home today.   Anticipated DC Date:  04/03/2013   Anticipated DC Plan:  HOME/SELF CARE      DC Planning Services  CM consult      Choice offered to / List presented to:             Status of service:  Completed, signed off Medicare Important Message given?  YES (If response is "NO", the following Medicare IM given date fields will be blank) Date Medicare IM given:  04/03/2013 Date Additional Medicare IM given:    Discharge Disposition:  HOME/SELF CARE  Per UR Regulation:    If discussed at Long Length of Stay Meetings, dates discussed:    Comments:  04/03/13 1520 Arlyss Queen, RN BSN CM

## 2013-04-03 NOTE — Progress Notes (Signed)
UR chart review completed.  

## 2013-05-25 DIAGNOSIS — R079 Chest pain, unspecified: Secondary | ICD-10-CM

## 2014-11-26 DIAGNOSIS — N472 Paraphimosis: Secondary | ICD-10-CM | POA: Diagnosis not present

## 2014-11-27 ENCOUNTER — Ambulatory Visit (INDEPENDENT_AMBULATORY_CARE_PROVIDER_SITE_OTHER): Payer: Medicare Other | Admitting: Cardiology

## 2014-11-27 ENCOUNTER — Encounter: Payer: Self-pay | Admitting: Cardiology

## 2014-11-27 ENCOUNTER — Other Ambulatory Visit: Payer: Self-pay | Admitting: Cardiology

## 2014-11-27 VITALS — BP 170/90 | HR 60 | Ht 73.0 in | Wt 229.5 lb

## 2014-11-27 DIAGNOSIS — I10 Essential (primary) hypertension: Secondary | ICD-10-CM

## 2014-11-27 DIAGNOSIS — I714 Abdominal aortic aneurysm, without rupture, unspecified: Secondary | ICD-10-CM

## 2014-11-27 DIAGNOSIS — I2510 Atherosclerotic heart disease of native coronary artery without angina pectoris: Secondary | ICD-10-CM | POA: Diagnosis not present

## 2014-11-27 NOTE — Progress Notes (Signed)
HPI The patient presents for evaluation preoperatively prior to right knee surgery. I saw him greater than 3 years ago. He is a history of coronary disease as described below. Since I last saw him he has done well. The patient denies any new symptoms such as chest discomfort, neck or arm discomfort. There has been no new shortness of breath, PND or orthopnea. There have been no reported palpitations, presyncope or syncope.  Despite his knee pain he is able to be active. He is able to do hunting and other activities without symptoms.    Allergies  Allergen Reactions  . Fish Allergy Nausea And Vomiting  . Iodine Nausea And Vomiting  . Shrimp [Shellfish Allergy] Nausea And Vomiting    Current Outpatient Prescriptions  Medication Sig Dispense Refill  . atorvastatin (LIPITOR) 40 MG tablet Take 40 mg by mouth daily.    . clopidogrel (PLAVIX) 75 MG tablet Take 75 mg by mouth daily.    . Ibuprofen (ADVIL PO) Take by mouth daily after breakfast. Takes 6 tablets in the a.m.    Marland Kitchen nitroGLYCERIN (NITROSTAT) 0.4 MG SL tablet DISSOLVE ONE TABLET UNDER THE TONGUE EVERY 5 MINUTES AS NEEDED FOR CHEST PAIN.  DO NOT EXCEED A TOTAL OF 3 DOSES IN 15 MINUTES 25 tablet 3  . pantoprazole (PROTONIX) 40 MG tablet Take 40 mg by mouth 2 (two) times daily.      No current facility-administered medications for this visit.    Past Medical History  Diagnosis Date  . 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 Kidney stone   . Hypertension   . Hyperlipemia   . Bowel obstruction     Past Surgical History  Procedure Laterality Date  . Kidney surgery    . Stents    . Coronary angioplasty with stent placement  10/2009  . Total knee arthroplasty      No family history on file.  History   Social History  . Marital Status: Married    Spouse Name: N/A    Number of Children: N/A  . Years of Education: N/A   Occupational History  . Not on file.   Social History Main Topics  . Smoking status: Former Smoker -- 1.00 packs/day for  40 years    Quit date: 06/14/2002  . Smokeless tobacco: Not on file  . Alcohol Use: No  . Drug Use: No  . Sexual Activity: Yes   Other Topics Concern  . Not on file   Social History Narrative    ROS:  As stated in the HPI and negative for all other systems.  PHYSICAL EXAM BP 170/90 mmHg  Pulse 60  Ht 6\' 1"  (1.854 m)  Wt 229 lb 8 oz (104.101 kg)  BMI 30.29 kg/m2  GENERAL:  Well appearing HEENT:  Pupils equal round and reactive, fundi not visualized, oral mucosa unremarkable NECK:  No jugular venous distention, waveform within normal limits, carotid upstroke brisk and symmetric, no bruits, no thyromegaly LYMPHATICS:  No cervical, inguinal adenopathy LUNGS:  Clear to auscultation bilaterally BACK:  No CVA tenderness CHEST:  Unremarkable HEART:  PMI not displaced or sustained,S1 and S2 within normal limits, no S3, no S4, no clicks, no rubs, no murmurs ABD:  Flat, positive bowel sounds normal in frequency in pitch, no bruits, no rebound, no guarding, no midline pulsatile mass, no hepatomegaly, no splenomegaly EXT:  2 plus pulses throughout, no edema, no cyanosis no clubbing SKIN:  No rashes no nodules NEURO:  Cranial nerves II through XII grossly  intact, motor grossly intact throughout PSYCH:  Cognitively intact, oriented to person place and time   EKG:    The sinus rhythm, rate 60, axis within normal limits, intervals within normal limits, no acute ST-T wave changes.  ASSESSMENT AND PLAN   CAD:  The patient has no new symptoms. He will continue with risk reduction. Of note if he has to stop his Plavix which would be okay for surgery I would like him to be on aspirin and I gave him an instruction.  HTN:  He says his blood pressure is not particularly elevated at home but is not quite sure. I gave him instructions on keeping a blood pressure diary.  AAA:  He is overdue for abdominal ultrasound. I will schedule this.  HYPERLIPIDEMIA:  Per Gar Ponto, MD   PREOP:  The  patient had a stress perfusion study in July of 2014 and I did review these results. There was no evidence of ischemia or infarct. She had a well preserved ejection fraction. No change in therapy is indicated. The patient would be at acceptable risk for the planned procedure with antiplatelet therapy as above.

## 2014-11-27 NOTE — Addendum Note (Signed)
Addended by: Diana Eves on: 11/27/2014 12:24 PM   Modules accepted: Orders

## 2014-11-27 NOTE — Patient Instructions (Signed)
Dr Percival Spanish has ordered the following test(s) to be done: 1. Abdominal Aortic Aneurysm duplex to be scheduled at [facility] Your physician has requested that you have an abdominal aorta duplex. During this test, an ultrasound is used to evaluate the aorta. Allow 30 minutes for this exam. Do not eat after midnight the day before and avoid carbonated beverages.  Dr Percival Spanish recommends that you keep a record of your blood pressure.  Dr Percival Spanish wants you to follow-up in 18 months. You will receive a reminder letter in the mail one months in advance. If you don't receive a letter, please call our office to schedule the follow-up appointment.

## 2014-11-29 ENCOUNTER — Encounter: Payer: Self-pay | Admitting: Cardiology

## 2014-12-03 ENCOUNTER — Ambulatory Visit (HOSPITAL_COMMUNITY): Payer: Medicare Other

## 2014-12-03 ENCOUNTER — Telehealth: Payer: Self-pay | Admitting: Cardiology

## 2014-12-03 DIAGNOSIS — Z87442 Personal history of urinary calculi: Secondary | ICD-10-CM | POA: Diagnosis not present

## 2014-12-03 DIAGNOSIS — N471 Phimosis: Secondary | ICD-10-CM | POA: Diagnosis not present

## 2014-12-03 DIAGNOSIS — R3919 Other difficulties with micturition: Secondary | ICD-10-CM | POA: Diagnosis not present

## 2014-12-03 DIAGNOSIS — R3915 Urgency of urination: Secondary | ICD-10-CM | POA: Diagnosis not present

## 2014-12-03 NOTE — Telephone Encounter (Signed)
I will look into this and have an answer tomarrow

## 2014-12-03 NOTE — Telephone Encounter (Signed)
Follow Up    Calling to follow up on clearance faxed over to stop Plavix 7 days prior to surgical procedure not yet scheduled. Please call.

## 2014-12-04 DIAGNOSIS — R3915 Urgency of urination: Secondary | ICD-10-CM | POA: Diagnosis not present

## 2014-12-06 ENCOUNTER — Ambulatory Visit (HOSPITAL_COMMUNITY)
Admission: RE | Admit: 2014-12-06 | Discharge: 2014-12-06 | Disposition: A | Discharge status: Home / Self Care | Payer: Medicare Other | Source: Ambulatory Visit | Attending: Cardiology | Admitting: Cardiology

## 2014-12-06 DIAGNOSIS — I714 Abdominal aortic aneurysm, without rupture, unspecified: Secondary | ICD-10-CM

## 2014-12-06 NOTE — Telephone Encounter (Signed)
Yes, it is ok for this pt. To stop his plavix for 7 days before the procedure per Dr. Percival Spanish

## 2014-12-10 ENCOUNTER — Telehealth: Payer: Self-pay | Admitting: Cardiology

## 2014-12-10 NOTE — Telephone Encounter (Signed)
New Message      Patient needs surgical clearance that was faxed last week to Dr.Hochein. Patient needs it sent back to Dr. Exie Parody [facility] [place].  Please call patient

## 2014-12-12 NOTE — Telephone Encounter (Signed)
Clearance sent and received per dr. Fredrik Rigger nurse

## 2014-12-17 ENCOUNTER — Telehealth: Payer: Self-pay | Admitting: *Deleted

## 2014-12-17 NOTE — Telephone Encounter (Signed)
Patient walked in and sem-completed yellow "walk-in" sheet. The "other" question he had was related to another surgical procedure he will be needing to have. Attempted to call patient's cell - no answer. Called home number and spoke with wife - she states she knew what patient was coming by office about.   Patient is scheduled for surgery with Dr. Maureen Ralphs 2/12 and he also needs to have a circumcision with Dr. Marica Otter at [facility] for circumcision. Patient will need to hold asa and plavix for 7 days for this procedure. Dr. Marica Otter needs to know how soon after ortho surgery patient can have urology procedure. This needs to be in writing and faxed to [phone] Attn: Angie  Will forward to Dr. Percival Spanish & JC to review and advise

## 2014-12-18 NOTE — Progress Notes (Signed)
Please put orders in Epic surgery  12-28-14 pre op 12-21-14 Thanks

## 2014-12-20 ENCOUNTER — Ambulatory Visit: Payer: Self-pay | Admitting: Orthopedic Surgery

## 2014-12-20 NOTE — Progress Notes (Signed)
Preoperative surgical orders have been place into the Epic hospital system for Dylan [name] on 12/20/2014, 1:08 PM  by Mickel Crow for surgery on 12-28-2014.  Preop Total Knee orders including Experal, IV Tylenol, and IV Decadron as long as there are no contraindications to the above medications. Arlee Muslim, PA-C

## 2014-12-21 ENCOUNTER — Encounter (HOSPITAL_COMMUNITY)
Admission: RE | Admit: 2014-12-21 | Discharge: 2014-12-21 | Disposition: A | Discharge status: Home / Self Care | Payer: Medicare Other | Source: Ambulatory Visit | Attending: Orthopedic Surgery | Admitting: Orthopedic Surgery

## 2014-12-21 ENCOUNTER — Encounter (HOSPITAL_COMMUNITY): Payer: Self-pay

## 2014-12-21 DIAGNOSIS — M179 Osteoarthritis of knee, unspecified: Secondary | ICD-10-CM | POA: Diagnosis not present

## 2014-12-21 DIAGNOSIS — Z01818 Encounter for other preprocedural examination: Secondary | ICD-10-CM | POA: Insufficient documentation

## 2014-12-21 HISTORY — DX: Personal history of urinary calculi: Z87.442

## 2014-12-21 HISTORY — DX: Unspecified cataract: H26.9

## 2014-12-21 LAB — COMPREHENSIVE METABOLIC PANEL
ALK PHOS: 84 U/L (ref 39–117)
ALT: 25 U/L (ref 0–53)
AST: 32 U/L (ref 0–37)
Albumin: 4.2 g/dL (ref 3.5–5.2)
Anion gap: 7 (ref 5–15)
BUN: 14 mg/dL (ref 6–23)
CALCIUM: 9.4 mg/dL (ref 8.4–10.5)
CHLORIDE: 107 mmol/L (ref 96–112)
CO2: 29 mmol/L (ref 19–32)
CREATININE: 1.18 mg/dL (ref 0.50–1.35)
GFR calc Af Amer: 71 mL/min — ABNORMAL LOW (ref >90)
GFR calc non Af Amer: 61 mL/min — ABNORMAL LOW (ref >90)
GLUCOSE: 101 mg/dL — AB (ref 70–99)
Potassium: 4.5 mmol/L (ref 3.5–5.1)
SODIUM: 143 mmol/L (ref 135–145)
Total Bilirubin: 0.9 mg/dL (ref 0.3–1.2)
Total Protein: 6.9 g/dL (ref 6.0–8.3)

## 2014-12-21 LAB — URINALYSIS, ROUTINE W REFLEX MICROSCOPIC
Bilirubin Urine: NEGATIVE
Glucose, UA: NEGATIVE mg/dL
HGB URINE DIPSTICK: NEGATIVE
Ketones, ur: NEGATIVE mg/dL
Leukocytes, UA: NEGATIVE
Nitrite: NEGATIVE
Protein, ur: NEGATIVE mg/dL
SPECIFIC GRAVITY, URINE: 1.013 (ref 1.005–1.030)
Urobilinogen, UA: 1 mg/dL (ref 0.0–1.0)
pH: 6 (ref 5.0–8.0)

## 2014-12-21 LAB — CBC
HCT: 41.5 % (ref 39.0–52.0)
Hemoglobin: 14 g/dL (ref 13.0–17.0)
MCH: 32.2 pg (ref 26.0–34.0)
MCHC: 33.7 g/dL (ref 30.0–36.0)
MCV: 95.4 fL (ref 78.0–100.0)
Platelets: 144 10*3/uL — ABNORMAL LOW (ref 150–400)
RBC: 4.35 MIL/uL (ref 4.22–5.81)
RDW: 12.2 % (ref 11.5–15.5)
WBC: 5.5 10*3/uL (ref 4.0–10.5)

## 2014-12-21 LAB — SURGICAL PCR SCREEN
MRSA, PCR: NEGATIVE
Staphylococcus aureus: NEGATIVE

## 2014-12-21 LAB — PROTIME-INR
INR: 1 (ref 0.00–1.49)
Prothrombin Time: 13.3 seconds (ref 11.6–15.2)

## 2014-12-21 LAB — APTT: aPTT: 29 seconds (ref 24–37)

## 2014-12-21 NOTE — Progress Notes (Signed)
Your Pt has screened with an elevated risk for obstructive sleep apnea using the Stop-Bang tool during a presurgical  Visit. A score of four or greater is an elevated risk. 

## 2014-12-21 NOTE — Patient Instructions (Signed)
19 DUDLEY MAGES  12/21/2014   Your procedure is scheduled on:   12-28-2014 Friday  Enter through [facility]  Entrance and follow signs to [facility]. Arrive at    0900    AM .  Call this number if you have problems the morning of surgery: [phone] .  For Living Will and/or Health Care Power Attorney Forms: please provide copy for your medical record,may bring AM of surgery(Forms should be already notarized -we do not provide this service).(12-21-14 No information preferred today).      Do not eat food/ or drink: After Midnight.      Take these medicines the morning of surgery with A SIP OF WATER: Atorvastatin. Pantoprazole.  Do not wear jewelry, make-up or nail polish.  Do not wear deodorant, lotions, powders, or perfumes.   Do not shave legs and under arms- 48 hours(2 days) prior to first CHG shower.(Shaving face and neck okay.)  Do not bring valuables to the hospital.(Hospital is not responsible for lost valuables).  Contacts, dentures or removable bridgework, body piercing, hair pins may not be worn into surgery.  Leave suitcase in the car. After surgery it may be brought to your room.  For patients admitted to the hospital, checkout time is 11:00 AM the day of discharge.(Restricted visitors-Any Persons displaying flu-like symptoms or illness).    Patients discharged the day of surgery will not be allowed to drive home. Must have responsible person with you x 24 hours once discharged.  Name and phone number of your driver: Letta Median spouse [phone] cell     Please read over the following fact sheets that you were given:  CHG(Chlorhexidine Gluconate 4% Surgical Soap) use, MRSA Information, Blood Transfusion fact sheet, Incentive Spirometry Instruction.  Remember : Type/Screen "Blue armbands" - may not be removed once applied(would result in being retested AM of surgery, if removed).         [facility] - Preparing for Surgery Before surgery, you can play an  important role.  Because skin is not sterile, your skin needs to be as free of germs as possible.  You can reduce the number of germs on your skin by washing with CHG (chlorahexidine gluconate) soap before surgery.  CHG is an antiseptic cleaner which kills germs and bonds with the skin to continue killing germs even after washing. Please DO NOT use if you have an allergy to CHG or antibacterial soaps.  If your skin becomes reddened/irritated stop using the CHG and inform your nurse when you arrive at Short Stay. Do not shave (including legs and underarms) for at least 48 hours prior to the first CHG shower.  You may shave your face/neck. Please follow these instructions carefully:  1.  Shower with CHG Soap the night before surgery and the  morning of Surgery.  2.  If you choose to wash your hair, wash your hair first as usual with your  normal  shampoo.  3.  After you shampoo, rinse your hair and body thoroughly to remove the  shampoo.                           4.  Use CHG as you would any other liquid soap.  You can apply chg directly  to the skin and wash                       Gently with a scrungie or clean  washcloth.  5.  Apply the CHG Soap to your body ONLY FROM THE NECK DOWN.   Do not use on face/ open                           Wound or open sores. Avoid contact with eyes, ears mouth and genitals (private parts).                       Wash face,  Genitals (private parts) with your normal soap.             6.  Wash thoroughly, paying special attention to the area where your surgery  will be performed.  7.  Thoroughly rinse your body with warm water from the neck down.  8.  DO NOT shower/wash with your normal soap after using and rinsing off  the CHG Soap.                9.  Pat yourself dry with a clean towel.            10.  Wear clean pajamas.            11.  Place clean sheets on your bed the night of your first shower and do not  sleep with pets. Day of Surgery : Do not apply any  lotions/deodorants the morning of surgery.  Please wear clean clothes to the hospital/surgery center.  FAILURE TO FOLLOW THESE INSTRUCTIONS MAY RESULT IN THE CANCELLATION OF YOUR SURGERY PATIENT SIGNATURE_________________________________  NURSE SIGNATURE__________________________________  ________________________________________________________________________   Adam Phenix  An incentive spirometer is a tool that can help keep your lungs clear and active. This tool measures how well you are filling your lungs with each breath. Taking long deep breaths may help reverse or decrease the chance of developing breathing (pulmonary) problems (especially infection) following:  A long period of time when you are unable to move or be active. BEFORE THE PROCEDURE   If the spirometer includes an indicator to show your best effort, your nurse or respiratory therapist will set it to a desired goal.  If possible, sit up straight or lean slightly forward. Try not to slouch.  Hold the incentive spirometer in an upright position. INSTRUCTIONS FOR USE   Sit on the edge of your bed if possible, or sit up as far as you can in bed or on a chair.  Hold the incentive spirometer in an upright position.  Breathe out normally.  Place the mouthpiece in your mouth and seal your lips tightly around it.  Breathe in slowly and as deeply as possible, raising the piston or the ball toward the top of the column.  Hold your breath for 3-5 seconds or for as long as possible. Allow the piston or ball to fall to the bottom of the column.  Remove the mouthpiece from your mouth and breathe out normally.  Rest for a few seconds and repeat Steps 1 through 7 at least 10 times every 1-2 hours when you are awake. Take your time and take a few normal breaths between deep breaths.  The spirometer may include an indicator to show your best effort. Use the indicator as a goal to work toward during each  repetition.  After each set of 10 deep breaths, practice coughing to be sure your lungs are clear. If you have an incision (the cut made at the time of surgery), support your incision when coughing  by placing a pillow or rolled up towels firmly against it. Once you are able to get out of bed, walk around indoors and cough well. You may stop using the incentive spirometer when instructed by your caregiver.  RISKS AND COMPLICATIONS  Take your time so you do not get dizzy or light-headed.  If you are in pain, you may need to take or ask for pain medication before doing incentive spirometry. It is harder to take a deep breath if you are having pain. AFTER USE  Rest and breathe slowly and easily.  It can be helpful to keep track of a log of your progress. Your caregiver can provide you with a simple table to help with this. If you are using the spirometer at home, follow these instructions: [place] IF:   You are having difficultly using the spirometer.  You have trouble using the spirometer as often as instructed.  Your pain medication is not giving enough relief while using the spirometer.  You develop fever of 100.5 F (38.1 C) or higher. SEEK IMMEDIATE MEDICAL CARE IF:   You cough up bloody sputum that had not been present before.  You develop fever of 102 F (38.9 C) or greater.  You develop worsening pain at or near the incision site. MAKE SURE YOU:   Understand these instructions.  Will watch your condition.  Will get help right away if you are not doing well or get worse. Document Released: 03/15/2007 Document Revised: 01/25/2012 Document Reviewed: 05/16/2007 Exit[employer] Patient Information 2014 [employer], [place].   ________________________________________________________________________  WHAT IS A BLOOD TRANSFUSION? Blood Transfusion Information  A transfusion is the replacement of blood or some of its parts. Blood is made up of multiple cells which provide  different functions.  Red blood cells carry oxygen and are used for blood loss replacement.  White blood cells fight against infection.  Platelets control bleeding.  Plasma helps clot blood.  Other blood products are available for specialized needs, such as hemophilia or other clotting disorders. BEFORE THE TRANSFUSION  Who gives blood for transfusions?   Healthy volunteers who are fully evaluated to make sure their blood is safe. This is blood bank blood. Transfusion therapy is the safest it has ever been in the practice of medicine. Before blood is taken from a donor, a complete history is taken to make sure that person has no history of diseases nor engages in risky social behavior (examples are intravenous drug use or sexual activity with multiple partners). The donor's travel history is screened to minimize risk of transmitting infections, such as malaria. The donated blood is tested for signs of infectious diseases, such as HIV and hepatitis. The blood is then tested to be sure it is compatible with you in order to minimize the chance of a transfusion reaction. If you or a relative donates blood, this is often done in anticipation of surgery and is not appropriate for emergency situations. It takes many days to process the donated blood. RISKS AND COMPLICATIONS Although transfusion therapy is very safe and saves many lives, the main dangers of transfusion include:   Getting an infectious disease.  Developing a transfusion reaction. This is an allergic reaction to something in the blood you were given. Every precaution is taken to prevent this. The decision to have a blood transfusion has been considered carefully by your caregiver before blood is given. Blood is not given unless the benefits outweigh the risks. AFTER THE TRANSFUSION  Right after receiving a blood  transfusion, you will usually feel much better and more energetic. This is especially true if your red blood cells have  gotten low (anemic). The transfusion raises the level of the red blood cells which carry oxygen, and this usually causes an energy increase.  The nurse administering the transfusion will monitor you carefully for complications. HOME CARE INSTRUCTIONS  No special instructions are needed after a transfusion. You may find your energy is better. Speak with your caregiver about any limitations on activity for underlying diseases you may have. SEEK MEDICAL CARE IF:   Your condition is not improving after your transfusion.  You develop redness or irritation at the intravenous (IV) site. SEEK IMMEDIATE MEDICAL CARE IF:  Any of the following symptoms occur over the next 12 hours:  Shaking chills.  You have a temperature by mouth above 102 F (38.9 C), not controlled by medicine.  Chest, back, or muscle pain.  People around you feel you are not acting correctly or are confused.  Shortness of breath or difficulty breathing.  Dizziness and fainting.  You get a rash or develop hives.  You have a decrease in urine output.  Your urine turns a dark color or changes to pink, red, or brown. Any of the following symptoms occur over the next 10 days:  You have a temperature by mouth above 102 F (38.9 C), not controlled by medicine.  Shortness of breath.  Weakness after normal activity.  The white part of the eye turns yellow (jaundice).  You have a decrease in the amount of urine or are urinating less often.  Your urine turns a dark color or changes to pink, red, or brown. Document Released: 10/30/2000 Document Revised: 01/25/2012 Document Reviewed: 06/18/2008 [facility] Patient Information 2014 [place], [place].  _______________________________________________________________________

## 2014-12-21 NOTE — Pre-Procedure Instructions (Addendum)
12-21-14 EKG 11-27-14 Epic. Clearance note (Dr. Quillian Quince) with chart. LOV notes Epic- Dr. Hochrein,cardiology. Echo 7'14 Epic. 12-21-14 1210 Dr. Landry Dyke given update,pt history of heart stent placed 2010, no further problems-using Plavix and Aspirin 81 mg, and instructed by cardiology to hold x 5 days prior."may proceed as planned".

## 2014-12-25 NOTE — H&P (Signed)
TOTAL KNEE ADMISSION H&P  Patient is being admitted for right total knee arthroplasty.  Subjective:  Chief Complaint:right knee pain.  HPI: Dylan [name], 69 y.o. male, has a history of pain and functional disability in the right knee due to arthritis and has failed non-surgical conservative treatments for greater than 12 weeks to includeNSAID's and/or analgesics, corticosteriod injections, flexibility and strengthening excercises and activity modification.  Onset of symptoms was gradual, starting 5 years ago with gradually worsening course since that time. The patient noted no past surgery on the right knee(s).  Patient currently rates pain in the right knee(s) at 7 out of 10 with activity. Patient has night pain, worsening of pain with activity and weight bearing, pain that interferes with activities of daily living, pain with passive range of motion, crepitus and joint swelling.  Patient has evidence of periarticular osteophytes and joint space narrowing by imaging studies. There is no active infection.  Patient Active Problem List   Diagnosis Date Noted  . SBO (small bowel obstruction) 06/14/2012  . Hypertension   . Abdominal aortic aneurysm 05/16/2010  . HYPERLIPIDEMIA 11/25/2009  . Obesity, unspecified 11/25/2009  . CORONARY ATHEROSCLEROSIS NATIVE CORONARY ARTERY 11/25/2009   Past Medical History  Diagnosis Date  . Hypertension   . Hyperlipemia   . Bowel obstruction   . CAD (coronary artery disease) 10/2009     (NQWMI 90% stenosis in the right coronary artery and otherwise nonobstructive disease.) x 1 stent  . History of kidney stones     multiple   . Cataract     Past Surgical History  Procedure Laterality Date  . Kidney stone surgery    . Total knee arthroplasty Left 2012  . Cardiac catheterization      '10- Dr. Percival Spanish follows     Current outpatient prescriptions:  .  aspirin EC 81 MG tablet, Take 81 mg by mouth daily., Disp: , Rfl:  .  atorvastatin (LIPITOR) 40 MG  tablet, Take 40 mg by mouth daily., Disp: , Rfl:  .  clopidogrel (PLAVIX) 75 MG tablet, Take 75 mg by mouth daily., Disp: , Rfl:  .  ibuprofen (ADVIL,MOTRIN) 200 MG tablet, Take 1,200 mg by mouth every morning., Disp: , Rfl:  .  nitroGLYCERIN (NITROSTAT) 0.4 MG SL tablet, DISSOLVE ONE TABLET UNDER THE TONGUE EVERY 5 MINUTES AS NEEDED FOR CHEST PAIN.  DO NOT EXCEED A TOTAL OF 3 DOSES IN 15 MINUTES, Disp: 25 tablet, Rfl: 3 .  pantoprazole (PROTONIX) 40 MG tablet, Take 40 mg by mouth daily. , Disp: , Rfl:   Allergies  Allergen Reactions  . Fish Allergy Nausea And Vomiting  . Iodine Nausea And Vomiting  . Shrimp [Shellfish Allergy] Nausea And Vomiting    History  Substance Use Topics  . Smoking status: Former Smoker -- 1.00 packs/day for 40 years    Quit date: 06/14/2002  . Smokeless tobacco: No  . Alcohol Use: No    Family History  Problem Relation Age of Onset  . Heart failure Father 39     Review of Systems  Constitutional: Negative.   HENT: Negative.   Eyes: Negative.   Respiratory: Positive for shortness of breath. Negative for cough, hemoptysis, sputum production and wheezing.        SOB with exertion  Cardiovascular: Negative.   Gastrointestinal: Negative.   Genitourinary: Negative.   Musculoskeletal: Positive for back pain and joint pain. Negative for myalgias, falls and neck pain.       Right knee pain  Skin: Negative.   Neurological: Negative.   Endo/Heme/Allergies: Negative.   Psychiatric/Behavioral: Negative.     Objective:  Physical Exam  Constitutional: He is oriented to person, place, and time. He appears well-developed. No distress.  Overweight  HENT:  Head: Normocephalic and atraumatic.  Right Ear: External ear normal.  Left Ear: External ear normal.  Nose: Nose normal.  Mouth/Throat: Oropharynx is clear and moist.  Eyes: Conjunctivae and EOM are normal.  Neck: Normal range of motion. Neck supple.  Cardiovascular: Normal rate, regular rhythm, normal  heart sounds and intact distal pulses.   No murmur heard. Respiratory: Effort normal and breath sounds normal. No respiratory distress. He has no wheezes.  GI: Soft. Bowel sounds are normal. He exhibits no distension. There is no tenderness.  Musculoskeletal:       Right hip: Normal.       Left hip: Normal.       Right knee: He exhibits decreased range of motion and swelling. He exhibits no effusion and no erythema. Tenderness found. Medial joint line and lateral joint line tenderness noted.       Left knee: Normal.       Right lower leg: He exhibits no tenderness and no swelling.       Left lower leg: He exhibits no tenderness and no swelling.  Left knee range is zero to 125 with no tenderness or instability. The right knee has no effusion with slight varus deformity. Range is 5 to 120 with moderate crepitus on range of motion with tenderness, medial greater than lateral, with no instability.  Neurological: He is alert and oriented to person, place, and time. He has normal strength and normal reflexes. No sensory deficit.  Skin: No rash noted. He is not diaphoretic. No erythema.  Psychiatric: He has a normal mood and affect. His behavior is normal.    Vitals  Weight: 225 lb Height: 73in Body Surface Area: 2.26 m Body Mass Index: 29.68 kg/m  Pulse: 72 (Regular)  BP: 146/80 (Sitting, Left Arm, Standard)  Imaging Review Plain radiographs demonstrate severe degenerative joint disease of the right knee(s). The overall alignment ismild varus. The bone quality appears to be good for age and reported activity level.  Assessment/Plan:  End stage primary osteoarthritis, right knee   The patient history, physical examination, clinical judgment of the provider and imaging studies are consistent with end stage degenerative joint disease of the right knee(s) and total knee arthroplasty is deemed medically necessary. The treatment options including medical management, injection therapy  arthroscopy and arthroplasty were discussed at length. The risks and benefits of total knee arthroplasty were presented and reviewed. The risks due to aseptic loosening, infection, stiffness, patella tracking problems, thromboembolic complications and other imponderables were discussed. The patient acknowledged the explanation, agreed to proceed with the plan and consent was signed. Patient is being admitted for inpatient treatment for surgery, pain control, PT, OT, prophylactic antibiotics, VTE prophylaxis, progressive ambulation and ADL's and discharge planning. The patient is planning to be discharged home with home health services    PCP: Dr. Quillian Quince Cardio: Dr. Percival Spanish TXA topical  Ardeen Jourdain, PA-C

## 2014-12-28 ENCOUNTER — Inpatient Hospital Stay (HOSPITAL_COMMUNITY): Payer: Medicare Other | Admitting: Anesthesiology

## 2014-12-28 ENCOUNTER — Inpatient Hospital Stay (HOSPITAL_COMMUNITY)
Admission: RE | Admit: 2014-12-28 | Discharge: 2014-12-30 | DRG: 470 | Disposition: A | Discharge status: Home Health | Payer: Medicare Other | Source: Ambulatory Visit | Attending: Orthopedic Surgery | Admitting: Orthopedic Surgery

## 2014-12-28 ENCOUNTER — Encounter (HOSPITAL_COMMUNITY)
Admission: RE | Disposition: A | Discharge status: Home Health | Payer: Self-pay | Source: Ambulatory Visit | Attending: Orthopedic Surgery

## 2014-12-28 ENCOUNTER — Encounter (HOSPITAL_COMMUNITY): Payer: Self-pay | Admitting: *Deleted

## 2014-12-28 DIAGNOSIS — Z6831 Body mass index (BMI) 31.0-31.9, adult: Secondary | ICD-10-CM

## 2014-12-28 DIAGNOSIS — E669 Obesity, unspecified: Secondary | ICD-10-CM | POA: Diagnosis not present

## 2014-12-28 DIAGNOSIS — Z01812 Encounter for preprocedural laboratory examination: Secondary | ICD-10-CM

## 2014-12-28 DIAGNOSIS — I2510 Atherosclerotic heart disease of native coronary artery without angina pectoris: Secondary | ICD-10-CM | POA: Diagnosis present

## 2014-12-28 DIAGNOSIS — M25561 Pain in right knee: Secondary | ICD-10-CM | POA: Diagnosis present

## 2014-12-28 DIAGNOSIS — Z87891 Personal history of nicotine dependence: Secondary | ICD-10-CM

## 2014-12-28 DIAGNOSIS — Z87442 Personal history of urinary calculi: Secondary | ICD-10-CM

## 2014-12-28 DIAGNOSIS — M1710 Unilateral primary osteoarthritis, unspecified knee: Secondary | ICD-10-CM | POA: Diagnosis present

## 2014-12-28 DIAGNOSIS — I10 Essential (primary) hypertension: Secondary | ICD-10-CM | POA: Diagnosis present

## 2014-12-28 DIAGNOSIS — I739 Peripheral vascular disease, unspecified: Secondary | ICD-10-CM | POA: Diagnosis not present

## 2014-12-28 DIAGNOSIS — M169 Osteoarthritis of hip, unspecified: Secondary | ICD-10-CM | POA: Diagnosis not present

## 2014-12-28 DIAGNOSIS — M1711 Unilateral primary osteoarthritis, right knee: Secondary | ICD-10-CM | POA: Diagnosis not present

## 2014-12-28 DIAGNOSIS — Z96652 Presence of left artificial knee joint: Secondary | ICD-10-CM | POA: Diagnosis not present

## 2014-12-28 DIAGNOSIS — E785 Hyperlipidemia, unspecified: Secondary | ICD-10-CM | POA: Diagnosis not present

## 2014-12-28 DIAGNOSIS — M179 Osteoarthritis of knee, unspecified: Secondary | ICD-10-CM | POA: Diagnosis present

## 2014-12-28 HISTORY — PX: TOTAL KNEE ARTHROPLASTY: SHX125

## 2014-12-28 LAB — TYPE AND SCREEN
ABO/RH(D): B POS
ANTIBODY SCREEN: NEGATIVE

## 2014-12-28 SURGERY — ARTHROPLASTY, KNEE, TOTAL
Anesthesia: Spinal | Site: Knee | Laterality: Right

## 2014-12-28 MED ORDER — NITROGLYCERIN 0.4 MG SL SUBL: 0.4000 mg | SUBLINGUAL_TABLET | SUBLINGUAL | Status: DC | PRN

## 2014-12-28 MED ORDER — DOCUSATE SODIUM 100 MG PO CAPS
100.0000 mg | ORAL_CAPSULE | Freq: Two times a day (BID) | ORAL | Status: DC
  Administered 2014-12-28 – 2014-12-30 (×4): 100 mg via ORAL

## 2014-12-28 MED ORDER — BUPIVACAINE HCL 0.25 % IJ SOLN
INTRAMUSCULAR | Status: DC | PRN
  Administered 2014-12-28: 20 mL

## 2014-12-28 MED ORDER — ATORVASTATIN CALCIUM 40 MG PO TABS
40.0000 mg | ORAL_TABLET | Freq: Every day | ORAL | Status: DC
Start: 2014-12-29 — End: 2014-12-30
  Administered 2014-12-29 – 2014-12-30 (×2): 40 mg via ORAL
  Filled 2014-12-28 (×2): qty 1

## 2014-12-28 MED ORDER — DIPHENHYDRAMINE HCL 12.5 MG/5ML PO ELIX: 12.5000 mg | ORAL_SOLUTION | ORAL | Status: DC | PRN

## 2014-12-28 MED ORDER — CEFAZOLIN SODIUM-DEXTROSE 2-3 GM-% IV SOLR
INTRAVENOUS | Status: AC
  Filled 2014-12-28: qty 50

## 2014-12-28 MED ORDER — PROPOFOL 10 MG/ML IV BOLUS
INTRAVENOUS | Status: AC
  Filled 2014-12-28: qty 20

## 2014-12-28 MED ORDER — CEFAZOLIN SODIUM-DEXTROSE 2-3 GM-% IV SOLR
2.0000 g | INTRAVENOUS | Status: AC
  Administered 2014-12-28: 2 g via INTRAVENOUS

## 2014-12-28 MED ORDER — PANTOPRAZOLE SODIUM 40 MG PO TBEC
40.0000 mg | DELAYED_RELEASE_TABLET | Freq: Every day | ORAL | Status: DC
  Administered 2014-12-28 – 2014-12-30 (×3): 40 mg via ORAL
  Filled 2014-12-28 (×3): qty 1

## 2014-12-28 MED ORDER — PROMETHAZINE HCL 25 MG/ML IJ SOLN: 6.2500 mg | INTRAMUSCULAR | Status: DC | PRN

## 2014-12-28 MED ORDER — SODIUM CHLORIDE 0.9 % IV SOLN: INTRAVENOUS | Status: DC

## 2014-12-28 MED ORDER — ONDANSETRON HCL 4 MG/2ML IJ SOLN
INTRAMUSCULAR | Status: DC | PRN
  Administered 2014-12-28: 4 mg via INTRAVENOUS

## 2014-12-28 MED ORDER — LIDOCAINE HCL (CARDIAC) 20 MG/ML IV SOLN
INTRAVENOUS | Status: AC
  Filled 2014-12-28: qty 5

## 2014-12-28 MED ORDER — SODIUM CHLORIDE 0.45 % IV SOLN
INTRAVENOUS | Status: DC
  Administered 2014-12-28 – 2014-12-29 (×2): via INTRAVENOUS

## 2014-12-28 MED ORDER — CHLORHEXIDINE GLUCONATE 4 % EX LIQD: 60.0000 mL | Freq: Once | CUTANEOUS | Status: DC

## 2014-12-28 MED ORDER — METHOCARBAMOL 1000 MG/10ML IJ SOLN
500.0000 mg | Freq: Four times a day (QID) | INTRAVENOUS | Status: DC | PRN
  Filled 2014-12-28: qty 5

## 2014-12-28 MED ORDER — OXYCODONE HCL 5 MG PO TABS
5.0000 mg | ORAL_TABLET | ORAL | Status: DC | PRN
  Administered 2014-12-28: 5 mg via ORAL
  Administered 2014-12-28 – 2014-12-30 (×11): 10 mg via ORAL
  Filled 2014-12-28 (×3): qty 2
  Filled 2014-12-28: qty 1
  Filled 2014-12-28 (×10): qty 2

## 2014-12-28 MED ORDER — FENTANYL CITRATE 0.05 MG/ML IJ SOLN
INTRAMUSCULAR | Status: DC | PRN
  Administered 2014-12-28: 25 ug via INTRAVENOUS
  Administered 2014-12-28: 50 ug via INTRAVENOUS
  Administered 2014-12-28: 25 ug via INTRAVENOUS

## 2014-12-28 MED ORDER — EPHEDRINE SULFATE 50 MG/ML IJ SOLN
INTRAMUSCULAR | Status: DC | PRN
  Administered 2014-12-28: 5 mg via INTRAVENOUS

## 2014-12-28 MED ORDER — ACETAMINOPHEN 500 MG PO TABS
1000.0000 mg | ORAL_TABLET | Freq: Four times a day (QID) | ORAL | Status: AC
  Administered 2014-12-28 – 2014-12-29 (×4): 1000 mg via ORAL
  Filled 2014-12-28 (×3): qty 2

## 2014-12-28 MED ORDER — TRAMADOL HCL 50 MG PO TABS
50.0000 mg | ORAL_TABLET | Freq: Four times a day (QID) | ORAL | Status: DC | PRN
  Administered 2014-12-29 – 2014-12-30 (×2): 100 mg via ORAL
  Filled 2014-12-28 (×2): qty 2

## 2014-12-28 MED ORDER — METOCLOPRAMIDE HCL 5 MG/ML IJ SOLN: 5.0000 mg | Freq: Three times a day (TID) | INTRAMUSCULAR | Status: DC | PRN

## 2014-12-28 MED ORDER — METHOCARBAMOL 500 MG PO TABS
500.0000 mg | ORAL_TABLET | Freq: Four times a day (QID) | ORAL | Status: DC | PRN
  Administered 2014-12-28 – 2014-12-30 (×7): 500 mg via ORAL
  Filled 2014-12-28 (×7): qty 1

## 2014-12-28 MED ORDER — FLEET ENEMA 7-19 GM/118ML RE ENEM: 1.0000 | ENEMA | Freq: Once | RECTAL | Status: AC | PRN

## 2014-12-28 MED ORDER — ACETAMINOPHEN 325 MG PO TABS
650.0000 mg | ORAL_TABLET | Freq: Four times a day (QID) | ORAL | Status: DC | PRN
  Administered 2014-12-29: 650 mg via ORAL
  Filled 2014-12-28: qty 2

## 2014-12-28 MED ORDER — LACTATED RINGERS IV SOLN
INTRAVENOUS | Status: DC | PRN
  Administered 2014-12-28 (×2): via INTRAVENOUS

## 2014-12-28 MED ORDER — RIVAROXABAN 10 MG PO TABS
10.0000 mg | ORAL_TABLET | Freq: Every day | ORAL | Status: DC
  Administered 2014-12-29 – 2014-12-30 (×2): 10 mg via ORAL
  Filled 2014-12-28 (×3): qty 1

## 2014-12-28 MED ORDER — PHENOL 1.4 % MT LIQD: 1.0000 | OROMUCOSAL | Status: DC | PRN

## 2014-12-28 MED ORDER — ACETAMINOPHEN 650 MG RE SUPP: 650.0000 mg | Freq: Four times a day (QID) | RECTAL | Status: DC | PRN

## 2014-12-28 MED ORDER — METOCLOPRAMIDE HCL 10 MG PO TABS: 5.0000 mg | ORAL_TABLET | Freq: Three times a day (TID) | ORAL | Status: DC | PRN

## 2014-12-28 MED ORDER — SODIUM CHLORIDE 0.9 % IR SOLN
Status: DC | PRN
  Administered 2014-12-28: 1000 mL

## 2014-12-28 MED ORDER — LACTATED RINGERS IV SOLN
INTRAVENOUS | Status: DC
  Administered 2014-12-28: 1000 mL via INTRAVENOUS

## 2014-12-28 MED ORDER — MENTHOL 3 MG MT LOZG
1.0000 | LOZENGE | OROMUCOSAL | Status: DC | PRN
  Filled 2014-12-28: qty 9

## 2014-12-28 MED ORDER — PENICILLIN V POTASSIUM 500 MG PO TABS
500.0000 mg | ORAL_TABLET | Freq: Four times a day (QID) | ORAL | Status: DC
  Administered 2014-12-28 – 2014-12-30 (×7): 500 mg via ORAL
  Filled 2014-12-28 (×10): qty 1

## 2014-12-28 MED ORDER — ONDANSETRON HCL 4 MG PO TABS: 4.0000 mg | ORAL_TABLET | Freq: Four times a day (QID) | ORAL | Status: DC | PRN

## 2014-12-28 MED ORDER — CEFAZOLIN SODIUM-DEXTROSE 2-3 GM-% IV SOLR
2.0000 g | Freq: Four times a day (QID) | INTRAVENOUS | Status: AC
Start: 2014-12-28 — End: 2014-12-28
  Administered 2014-12-28 (×2): 2 g via INTRAVENOUS
  Filled 2014-12-28 (×2): qty 50

## 2014-12-28 MED ORDER — LIDOCAINE HCL (CARDIAC) 20 MG/ML IV SOLN
INTRAVENOUS | Status: DC | PRN
  Administered 2014-12-28: 50 mg via INTRAVENOUS

## 2014-12-28 MED ORDER — DEXAMETHASONE SODIUM PHOSPHATE 10 MG/ML IJ SOLN
INTRAMUSCULAR | Status: AC
  Filled 2014-12-28: qty 1

## 2014-12-28 MED ORDER — MEPERIDINE HCL 50 MG/ML IJ SOLN: 6.2500 mg | INTRAMUSCULAR | Status: DC | PRN

## 2014-12-28 MED ORDER — BUPIVACAINE IN DEXTROSE 0.75-8.25 % IT SOLN
INTRATHECAL | Status: DC | PRN
  Administered 2014-12-28: 15 mg via INTRATHECAL

## 2014-12-28 MED ORDER — BUPIVACAINE HCL (PF) 0.25 % IJ SOLN
INTRAMUSCULAR | Status: AC
  Filled 2014-12-28: qty 30

## 2014-12-28 MED ORDER — PROPOFOL INFUSION 10 MG/ML OPTIME
INTRAVENOUS | Status: DC | PRN
  Administered 2014-12-28: 160 ug/kg/min via INTRAVENOUS

## 2014-12-28 MED ORDER — MORPHINE SULFATE 2 MG/ML IJ SOLN
1.0000 mg | INTRAMUSCULAR | Status: DC | PRN
Start: 2014-12-28 — End: 2014-12-30
  Administered 2014-12-29 (×2): 2 mg via INTRAVENOUS
  Filled 2014-12-28 (×2): qty 1

## 2014-12-28 MED ORDER — BUPIVACAINE LIPOSOME 1.3 % IJ SUSP
INTRAMUSCULAR | Status: DC | PRN
  Administered 2014-12-28: 20 mL

## 2014-12-28 MED ORDER — SODIUM CHLORIDE 0.9 % IJ SOLN
INTRAMUSCULAR | Status: DC | PRN
  Administered 2014-12-28: 30 mL

## 2014-12-28 MED ORDER — MIDAZOLAM HCL 5 MG/5ML IJ SOLN
INTRAMUSCULAR | Status: DC | PRN
  Administered 2014-12-28: 2 mg via INTRAVENOUS

## 2014-12-28 MED ORDER — SODIUM CHLORIDE 0.9 % IJ SOLN
INTRAMUSCULAR | Status: AC
  Filled 2014-12-28: qty 50

## 2014-12-28 MED ORDER — TRANEXAMIC ACID 100 MG/ML IV SOLN: 1000.0000 mg | INTRAVENOUS | Status: DC | PRN

## 2014-12-28 MED ORDER — TRANEXAMIC ACID 100 MG/ML IV SOLN
2000.0000 mg | Freq: Once | INTRAVENOUS | Status: DC
  Filled 2014-12-28: qty 20

## 2014-12-28 MED ORDER — POLYETHYLENE GLYCOL 3350 17 G PO PACK: 17.0000 g | PACK | Freq: Every day | ORAL | Status: DC | PRN

## 2014-12-28 MED ORDER — ASPIRIN EC 81 MG PO TBEC
81.0000 mg | DELAYED_RELEASE_TABLET | Freq: Every day | ORAL | Status: DC
  Administered 2014-12-29 – 2014-12-30 (×2): 81 mg via ORAL
  Filled 2014-12-28 (×2): qty 1

## 2014-12-28 MED ORDER — STERILE WATER FOR IRRIGATION IR SOLN
Status: DC | PRN
  Administered 2014-12-28: 1500 mL

## 2014-12-28 MED ORDER — HYDROMORPHONE HCL 1 MG/ML IJ SOLN: 0.2500 mg | INTRAMUSCULAR | Status: DC | PRN

## 2014-12-28 MED ORDER — ONDANSETRON HCL 4 MG/2ML IJ SOLN
4.0000 mg | Freq: Four times a day (QID) | INTRAMUSCULAR | Status: DC | PRN
  Administered 2014-12-29: 4 mg via INTRAVENOUS
  Filled 2014-12-28: qty 2

## 2014-12-28 MED ORDER — FENTANYL CITRATE 0.05 MG/ML IJ SOLN
INTRAMUSCULAR | Status: AC
  Filled 2014-12-28: qty 2

## 2014-12-28 MED ORDER — MIDAZOLAM HCL 2 MG/2ML IJ SOLN
INTRAMUSCULAR | Status: AC
  Filled 2014-12-28: qty 2

## 2014-12-28 MED ORDER — 0.9 % SODIUM CHLORIDE (POUR BTL) OPTIME
TOPICAL | Status: DC | PRN
  Administered 2014-12-28: 1000 mL

## 2014-12-28 MED ORDER — ASPIRIN 81 MG PO CHEW
81.0000 mg | CHEWABLE_TABLET | Freq: Once | ORAL | Status: AC
  Administered 2014-12-28: 81 mg via ORAL
  Filled 2014-12-28: qty 1

## 2014-12-28 MED ORDER — BUPIVACAINE LIPOSOME 1.3 % IJ SUSP
20.0000 mL | Freq: Once | INTRAMUSCULAR | Status: DC
  Filled 2014-12-28: qty 20

## 2014-12-28 MED ORDER — DEXAMETHASONE SODIUM PHOSPHATE 10 MG/ML IJ SOLN
10.0000 mg | Freq: Once | INTRAMUSCULAR | Status: AC
  Administered 2014-12-29: 10 mg via INTRAVENOUS
  Filled 2014-12-28: qty 1

## 2014-12-28 MED ORDER — SODIUM CHLORIDE 0.9 % IV SOLN
2000.0000 mg | INTRAVENOUS | Status: DC | PRN
  Administered 2014-12-28: 2000 mg via TOPICAL

## 2014-12-28 MED ORDER — ACETAMINOPHEN 10 MG/ML IV SOLN
1000.0000 mg | Freq: Once | INTRAVENOUS | Status: AC
  Administered 2014-12-28: 1000 mg via INTRAVENOUS
  Filled 2014-12-28: qty 100

## 2014-12-28 MED ORDER — DEXAMETHASONE SODIUM PHOSPHATE 10 MG/ML IJ SOLN
10.0000 mg | Freq: Once | INTRAMUSCULAR | Status: AC
  Administered 2014-12-28: 10 mg via INTRAVENOUS

## 2014-12-28 MED ORDER — BISACODYL 10 MG RE SUPP: 10.0000 mg | Freq: Every day | RECTAL | Status: DC | PRN

## 2014-12-28 SURGICAL SUPPLY — 66 items
BAG DECANTER FOR FLEXI CONT (MISCELLANEOUS) ×2 IMPLANT
BAG SPEC THK2 15X12 ZIP CLS (MISCELLANEOUS) ×1
BAG ZIPLOCK 12X15 (MISCELLANEOUS) ×3 IMPLANT
BANDAGE ELASTIC 6 VELCRO ST LF (GAUZE/BANDAGES/DRESSINGS) ×3 IMPLANT
BANDAGE ESMARK 6X9 LF (GAUZE/BANDAGES/DRESSINGS) ×1 IMPLANT
BLADE SAG 18X100X1.27 (BLADE) ×3 IMPLANT
BLADE SAW SGTL 11.0X1.19X90.0M (BLADE) ×3 IMPLANT
BNDG CMPR 9X6 STRL LF SNTH (GAUZE/BANDAGES/DRESSINGS) ×1
BNDG ESMARK 6X9 LF (GAUZE/BANDAGES/DRESSINGS) ×3
BOWL SMART MIX CTS (DISPOSABLE) ×3 IMPLANT
CAP KNEE TOTAL 3 SIGMA ×2 IMPLANT
CEMENT HV SMART SET (Cement) ×6 IMPLANT
CLOSURE WOUND 1/2 X4 (GAUZE/BANDAGES/DRESSINGS) ×2
CUFF TOURN SGL QUICK 34 (TOURNIQUET CUFF) ×3
CUFF TRNQT CYL 34X4X40X1 (TOURNIQUET CUFF) ×1 IMPLANT
DECANTER SPIKE VIAL GLASS SM (MISCELLANEOUS) ×5 IMPLANT
DRAPE EXTREMITY T 121X128X90 (DRAPE) ×3 IMPLANT
DRAPE POUCH INSTRU U-SHP 10X18 (DRAPES) ×3 IMPLANT
DRAPE U-SHAPE 47X51 STRL (DRAPES) ×3 IMPLANT
DRSG ADAPTIC 3X8 NADH LF (GAUZE/BANDAGES/DRESSINGS) ×3 IMPLANT
DRSG PAD ABDOMINAL 8X10 ST (GAUZE/BANDAGES/DRESSINGS) ×3 IMPLANT
DURAPREP 26ML APPLICATOR (WOUND CARE) ×3 IMPLANT
ELECT REM PT RETURN 9FT ADLT (ELECTROSURGICAL) ×3
ELECTRODE REM PT RTRN 9FT ADLT (ELECTROSURGICAL) ×1 IMPLANT
EVACUATOR 1/8 PVC DRAIN (DRAIN) ×3 IMPLANT
FACESHIELD WRAPAROUND (MASK) ×15 IMPLANT
FACESHIELD WRAPAROUND OR TEAM (MASK) ×5 IMPLANT
GAUZE SPONGE 4X4 12PLY STRL (GAUZE/BANDAGES/DRESSINGS) ×3 IMPLANT
GLOVE BIO SURGEON STRL SZ7.5 (GLOVE) IMPLANT
GLOVE BIO SURGEON STRL SZ8 (GLOVE) ×3 IMPLANT
GLOVE BIOGEL PI IND STRL 6.5 (GLOVE) IMPLANT
GLOVE BIOGEL PI IND STRL 8 (GLOVE) ×1 IMPLANT
GLOVE BIOGEL PI INDICATOR 6.5 (GLOVE)
GLOVE BIOGEL PI INDICATOR 8 (GLOVE) ×2
GLOVE SURG SS PI 6.5 STRL IVOR (GLOVE) IMPLANT
GOWN STRL REUS W/TWL LRG LVL3 (GOWN DISPOSABLE) ×3 IMPLANT
GOWN STRL REUS W/TWL XL LVL3 (GOWN DISPOSABLE) IMPLANT
HANDPIECE INTERPULSE COAX TIP (DISPOSABLE) ×3
IMMOBILIZER KNEE 20 (SOFTGOODS) ×3
IMMOBILIZER KNEE 20 THIGH 36 (SOFTGOODS) ×1 IMPLANT
IMMOBILIZER KNEE 22 UNIV (SOFTGOODS) ×2 IMPLANT
KIT BASIN OR (CUSTOM PROCEDURE TRAY) ×3 IMPLANT
MANIFOLD NEPTUNE II (INSTRUMENTS) ×3 IMPLANT
NDL SAFETY ECLIPSE 18X1.5 (NEEDLE) ×2 IMPLANT
NEEDLE HYPO 18GX1.5 SHARP (NEEDLE) ×6
NS IRRIG 1000ML POUR BTL (IV SOLUTION) ×3 IMPLANT
PACK TOTAL JOINT (CUSTOM PROCEDURE TRAY) ×3 IMPLANT
PAD ABD 8X10 STRL (GAUZE/BANDAGES/DRESSINGS) ×2 IMPLANT
PADDING CAST COTTON 6X4 STRL (CAST SUPPLIES) ×7 IMPLANT
PEN SKIN MARKING BROAD (MISCELLANEOUS) ×3 IMPLANT
POSITIONER SURGICAL ARM (MISCELLANEOUS) ×3 IMPLANT
SET HNDPC FAN SPRY TIP SCT (DISPOSABLE) ×1 IMPLANT
STRIP CLOSURE SKIN 1/2X4 (GAUZE/BANDAGES/DRESSINGS) ×4 IMPLANT
SUCTION FRAZIER 12FR DISP (SUCTIONS) ×3 IMPLANT
SUT MNCRL AB 4-0 PS2 18 (SUTURE) ×3 IMPLANT
SUT VIC AB 2-0 CT1 27 (SUTURE) ×9
SUT VIC AB 2-0 CT1 TAPERPNT 27 (SUTURE) ×3 IMPLANT
SUT VLOC 180 0 24IN GS25 (SUTURE) ×3 IMPLANT
SYR 20CC LL (SYRINGE) ×3 IMPLANT
SYR 50ML LL SCALE MARK (SYRINGE) ×3 IMPLANT
TOWEL OR 17X26 10 PK STRL BLUE (TOWEL DISPOSABLE) ×3 IMPLANT
TOWEL OR NON WOVEN STRL DISP B (DISPOSABLE) IMPLANT
TRAY FOLEY CATH 14FRSI W/METER (CATHETERS) ×3 IMPLANT
WATER STERILE IRR 1500ML POUR (IV SOLUTION) ×3 IMPLANT
WRAP KNEE MAXI GEL POST OP (GAUZE/BANDAGES/DRESSINGS) ×3 IMPLANT
YANKAUER SUCT BULB TIP 10FT TU (MISCELLANEOUS) ×3 IMPLANT

## 2014-12-28 NOTE — Interval H&P Note (Signed)
History and Physical Interval Note:  12/28/2014 10:55 AM  Dylan [name]  has presented today for surgery, with the diagnosis of OA RIGHT KNEE   The various methods of treatment have been discussed with the patient and family. After consideration of risks, benefits and other options for treatment, the patient has consented to  Procedure(s): RIGHT TOTAL KNEE ARTHROPLASTY (Right) as a surgical intervention .  The patient's history has been reviewed, patient examined, no change in status, stable for surgery.  I have reviewed the patient's chart and labs.  Questions were answered to the patient's satisfaction.     Gearlean Alf

## 2014-12-28 NOTE — Anesthesia Procedure Notes (Signed)
Spinal Patient location during procedure: OR Start time: 12/28/2014 11:45 AM End time: 12/28/2014 11:51 AM Staffing Resident/CRNA: Sherian Maroon A Performed by: resident/CRNA  Spinal Block Patient position: sitting Prep: Betadine Patient monitoring: heart rate, cardiac monitor, continuous pulse ox and blood pressure Approach: midline Location: L3-4 Injection technique: single-shot Needle Needle type: Sprotte  Needle gauge: 24 G Needle length: 9 cm Needle insertion depth: 5 cm Assessment Sensory level: T10

## 2014-12-28 NOTE — Transfer of Care (Signed)
Immediate Anesthesia Transfer of Care Note  Patient: Dylan [name]  Procedure(s) Performed: Procedure(s): RIGHT TOTAL KNEE ARTHROPLASTY (Right)  Patient Location: PACU  Anesthesia Type:Spinal  Level of Consciousness: awake, alert , oriented and patient cooperative  Airway & Oxygen Therapy: Patient Spontanous Breathing and Patient connected to face mask oxygen  Post-op Assessment: Report given to RN and Post -op Vital signs reviewed and stable  Post vital signs: Reviewed and stable  Last Vitals:  Filed Vitals:   12/28/14 0852  BP: 180/90  Pulse: 65  Temp: 36.4 C  Resp: 18    Complications: No apparent anesthesia complications

## 2014-12-28 NOTE — Progress Notes (Signed)
Utilization review completed.  

## 2014-12-28 NOTE — Anesthesia Preprocedure Evaluation (Addendum)
Anesthesia Evaluation  Patient identified by MRN, date of birth, ID band Patient awake    Reviewed: Allergy & Precautions, NPO status , Patient's Chart, lab work & pertinent test results  Airway Mallampati: II  TM Distance: >3 FB Neck ROM: Full    Dental no notable dental hx.    Pulmonary former smoker,  breath sounds clear to auscultation  Pulmonary exam normal       Cardiovascular hypertension, Pt. on medications + CAD and + Peripheral Vascular Disease Rhythm:Regular Rate:Normal     Neuro/Psych negative neurological ROS  negative psych ROS   GI/Hepatic negative GI ROS, Neg liver ROS,   Endo/Other  negative endocrine ROS  Renal/GU negative Renal ROS     Musculoskeletal negative musculoskeletal ROS (+) Arthritis -, Osteoarthritis,    Abdominal   Peds  Hematology negative hematology ROS (+)   Anesthesia Other Findings   Reproductive/Obstetrics                            Anesthesia Physical Anesthesia Plan  ASA: III  Anesthesia Plan: Spinal   Post-op Pain Management:    Induction:   Airway Management Planned:   Additional Equipment:   Intra-op Plan:   Post-operative Plan:   Informed Consent: I have reviewed the patients History and Physical, chart, labs and discussed the procedure including the risks, benefits and alternatives for the proposed anesthesia with the patient or authorized representative who has indicated his/her understanding and acceptance.   Dental advisory given  Plan Discussed with: CRNA  Anesthesia Plan Comments: (Pt has been of antiplatelet therapy for 7 days. Dr. Casimer Lanius clearly wrote that the patient should remain on ASA for surgery. This was misunderstood by the patient. I called Dr. Daleen Snook office, but he is out on vacation. I spoke with Dr. Harrington Challenger. She recommend that he be given ASA post-op. I will give him ASA in preop, but remain tense as I do not  know Dr. Daleen Snook reasoning for continuing ASA. I discussed this with Dr. Wynelle Link.   Furthermore, the pt states that he has a tooth that is "abscessing or almost abscessed". He saw his dentist recently who ordered penicillin that he started a few days ago. I discussed this with Dr. Wynelle Link also and he will evaluate the pt. )        Anesthesia Quick Evaluation

## 2014-12-28 NOTE — Op Note (Signed)
Pre-operative diagnosis- Osteoarthritis  Right knee(s)  Post-operative diagnosis- Osteoarthritis Right knee(s)  Procedure-  Right  Total Knee Arthroplasty  Surgeon- Dione Plover. Smiley Birr, MD  Assistant- Otilio Jefferson   Anesthesia-  Spinal  EBL-* No blood loss amount entered *   Drains Hemovac  Tourniquet time-  Total Tourniquet Time Documented: Thigh (Right) - 37 minutes Total: Thigh (Right) - 37 minutes     Complications- None  Condition-PACU - hemodynamically stable.   Brief Clinical Note  Dylan [name] is a 69 y.o. year old male with end stage OA of his right knee with progressively worsening pain and dysfunction. He has constant pain, with activity and at rest and significant functional deficits with difficulties even with ADLs. He has had extensive non-op management including analgesics, injections of cortisone and viscosupplements, and home exercise program, but remains in significant pain with significant dysfunction. Radiographs show bone on bone arthritis medial and patellofemoral. He presents now for right Total Knee Arthroplasty.     Procedure in detail---   The patient is brought into the operating room and positioned supine on the operating table. After successful administration of  Spinal,   a tourniquet is placed high on the  Right thigh(s) and the lower extremity is prepped and draped in the usual sterile fashion. Time out is performed by the operating team and then the  Right lower extremity is wrapped in Esmarch, knee flexed and the tourniquet inflated to 300 mmHg.       A midline incision is made with a ten blade through the subcutaneous tissue to the level of the extensor mechanism. A fresh blade is used to make a medial parapatellar arthrotomy. Soft tissue over the proximal medial tibia is subperiosteally elevated to the joint line with a knife and into the semimembranosus bursa with a Cobb elevator. Soft tissue over the proximal lateral tibia is elevated with  attention being paid to avoiding the patellar tendon on the tibial tubercle. The patella is everted, knee flexed 90 degrees and the ACL and PCL are removed. Findings are bone on bone arthritis medial and patellofemoral with large global osteophytes.        The drill is used to create a starting hole in the distal femur and the canal is thoroughly irrigated with sterile saline to remove the fatty contents. The 5 degree Right  valgus alignment guide is placed into the femoral canal and the distal femoral cutting block is pinned to remove 10 mm off the distal femur. Resection is made with an oscillating saw.      The tibia is subluxed forward and the menisci are removed. The extramedullary alignment guide is placed referencing proximally at the medial aspect of the tibial tubercle and distally along the second metatarsal axis and tibial crest. The block is pinned to remove 70mm off the more deficient medial  side. Resection is made with an oscillating saw. Size 4is the most appropriate size for the tibia and the proximal tibia is prepared with the modular drill and keel punch for that size.      The femoral sizing guide is placed and size 4 is most appropriate. Rotation is marked off the epicondylar axis and confirmed by creating a rectangular flexion gap at 90 degrees. The size 4 cutting block is pinned in this rotation and the anterior, posterior and chamfer cuts are made with the oscillating saw. The intercondylar block is then placed and that cut is made.      Trial size 4 tibial  component, trial size 4 posterior stabilized femur and a 10  mm posterior stabilized rotating platform insert trial is placed. Full extension is achieved with excellent varus/valgus and anterior/posterior balance throughout full range of motion. The patella is everted and thickness measured to be 27  mm. Free hand resection is taken to 15 mm, a 41 template is placed, lug holes are drilled, trial patella is placed, and it tracks  normally. Osteophytes are removed off the posterior femur with the trial in place. All trials are removed and the cut bone surfaces prepared with pulsatile lavage. Cement is mixed and once ready for implantation, the size 4 tibial implant, size  4 posterior stabilized femoral component, and the size 41 patella are cemented in place and the patella is held with the clamp. The trial insert is placed and the knee held in full extension. The Exparel (20 ml mixed with 30 ml saline) and .25% Bupivicaine, are injected into the extensor mechanism, posterior capsule, medial and lateral gutters and subcutaneous tissues.  All extruded cement is removed and once the cement is hard the permanent 10 mm posterior stabilized rotating platform insert is placed into the tibial tray.      The wound is copiously irrigated with saline solution and the extensor mechanism closed over a hemovac drain with #1 V-loc suture. The tourniquet is released for a total tourniquet time of 38  minutes. Flexion against gravity is 140 degrees and the patella tracks normally. Subcutaneous tissue is closed with 2.0 vicryl and subcuticular with running 4.0 Monocryl. The incision is cleaned and dried and steri-strips and a bulky sterile dressing are applied. The limb is placed into a knee immobilizer and the patient is awakened and transported to recovery in stable condition.      Please note that a surgical assistant was a medical necessity for this procedure in order to perform it in a safe and expeditious manner. Surgical assistant was necessary to retract the ligaments and vital neurovascular structures to prevent injury to them and also necessary for proper positioning of the limb to allow for anatomic placement of the prosthesis.   Dione Plover Dylan Happ, MD    12/28/2014, 12:50 PM

## 2014-12-28 NOTE — Anesthesia Postprocedure Evaluation (Signed)
Anesthesia Post Note  Patient: Dylan [name]  Procedure(s) Performed: Procedure(s) (LRB): RIGHT TOTAL KNEE ARTHROPLASTY (Right)  Anesthesia type: Spinal  Patient location: PACU  Post pain: Pain level controlled  Post assessment: Post-op Vital signs reviewed  Last Vitals: BP 157/76 mmHg  Pulse 82  Temp(Src) 36.8 C (Oral)  Resp 18  Ht 6' (1.829 m)  Wt 230 lb (104.327 kg)  BMI 31.19 kg/m2  SpO2 94%  Post vital signs: Reviewed  Level of consciousness: sedated  Complications: No apparent anesthesia complications

## 2014-12-29 LAB — CBC
HEMATOCRIT: 38.6 % — AB (ref 39.0–52.0)
Hemoglobin: 13.1 g/dL (ref 13.0–17.0)
MCH: 32.3 pg (ref 26.0–34.0)
MCHC: 33.9 g/dL (ref 30.0–36.0)
MCV: 95.1 fL (ref 78.0–100.0)
PLATELETS: 192 10*3/uL (ref 150–400)
RBC: 4.06 MIL/uL — ABNORMAL LOW (ref 4.22–5.81)
RDW: 12.2 % (ref 11.5–15.5)
WBC: 14.4 10*3/uL — ABNORMAL HIGH (ref 4.0–10.5)

## 2014-12-29 LAB — BASIC METABOLIC PANEL
ANION GAP: 8 (ref 5–15)
BUN: 17 mg/dL (ref 6–23)
CO2: 26 mmol/L (ref 19–32)
Calcium: 8.8 mg/dL (ref 8.4–10.5)
Chloride: 105 mmol/L (ref 96–112)
Creatinine, Ser: 1.07 mg/dL (ref 0.50–1.35)
GFR calc non Af Amer: 69 mL/min — ABNORMAL LOW (ref >90)
GFR, EST AFRICAN AMERICAN: 80 mL/min — AB (ref >90)
Glucose, Bld: 121 mg/dL — ABNORMAL HIGH (ref 70–99)
Potassium: 4.4 mmol/L (ref 3.5–5.1)
Sodium: 139 mmol/L (ref 135–145)

## 2014-12-29 NOTE — Evaluation (Signed)
Physical Therapy Evaluation Patient Details Name: Dylan [name] MRN: [id number] DOB: Apr 29, 1946 Today's Date: 12/29/2014   History of Present Illness  s/p R TKA; PMHx: L TKA  Clinical Impression  Pt will benefit from PT to address deficits below; plan is for home with HHPT; pt has walker, unxure of SW or RW, will ck with wife    Follow Up Recommendations Home health PT    Equipment Recommendations  None recommended by PT    Recommendations for Other Services       Precautions / Restrictions Precautions Precautions: Knee Restrictions Weight Bearing Restrictions: No Other Position/Activity Restrictions: WBAT      Mobility  Bed Mobility Overal bed mobility: Needs Assistance Bed Mobility: Supine to Sit     Supine to sit: Min assist     General bed mobility comments: min with RLE, cues for technique  Transfers Overall transfer level: Needs assistance Equipment used: Rolling walker (2 wheeled) Transfers: Sit to/from Stand Sit to Stand: Min guard         General transfer comment: cues for hand placement  Ambulation/Gait Ambulation/Gait assistance: Min guard Ambulation Distance (Feet): 160 Feet Assistive device: Rolling walker (2 wheeled) Gait Pattern/deviations: Step-to pattern;Trunk flexed     General Gait Details: cues for RW position and sequence  Stairs            Wheelchair Mobility    Modified Rankin (Stroke Patients Only)       Balance                                             Pertinent Vitals/Pain Pain Assessment: 0-10 Pain Score: 3  Pain Location: right knee Pain Descriptors / Indicators: Sore Pain Intervention(s): Limited activity within patient's tolerance;Monitored during session;Premedicated before session;Ice applied    Home Living Family/patient expects to be discharged to:: Private residence Living Arrangements: Spouse/significant other   Type of Home: House Home Access: Stairs to enter    [employer] of Steps: 2 Home Layout: One level Home Equipment: Environmental consultant - 2 wheels;Bedside commode      Prior Function Level of Independence: Independent               Hand Dominance        Extremity/Trunk Assessment   Upper Extremity Assessment: Overall WFL for tasks assessed;Defer to OT evaluation           Lower Extremity Assessment: RLE deficits/detail RLE Deficits / Details: ankle WFL;  knee extension and hip flexion 2/5       Communication   Communication: No difficulties  Cognition Arousal/Alertness: Awake/alert Behavior During Therapy: WFL for tasks assessed/performed Overall Cognitive Status: Within Functional Limits for tasks assessed                      General Comments      Exercises Total Joint Exercises Ankle Circles/Pumps: AROM;Both;5 reps Quad Sets: 5 reps;AROM;Right      Assessment/Plan    PT Assessment    PT Diagnosis Difficulty walking   PT Problem List    PT Treatment Interventions     PT Goals (Current goals can be found in the Care Plan section) Acute Rehab PT Goals Patient Stated Goal: home, return to I PT Goal Formulation: With patient Time For Goal Achievement: 01/05/15 Potential to Achieve Goals: Good    Frequency  Barriers to discharge        Co-evaluation               End of Session Equipment Utilized During Treatment: Right knee immobilizer Activity Tolerance: Patient tolerated treatment well Patient left: in chair;with call bell/phone within reach;with chair alarm set Nurse Communication: Mobility status         Time: 2518-9842 PT Time Calculation (min) (ACUTE ONLY): 26 min   Charges:   PT Evaluation $Initial PT Evaluation Tier I: 1 Procedure PT Treatments $Gait Training: 8-22 mins   PT G Codes:        Sherrick Araki Jan 05, 2015, 10:14 AM

## 2014-12-29 NOTE — Discharge Instructions (Addendum)
° °Dr. Reznor Ferrando °Total Joint Specialist °[facility] °[address] °[place], [place] [address] °[phone] ° °TOTAL KNEE REPLACEMENT POSTOPERATIVE DIRECTIONS ° ° ° °Knee Rehabilitation, Guidelines Following Surgery  °Results after knee surgery are often greatly improved when you follow the exercise, range of motion and muscle strengthening exercises prescribed by your doctor. Safety measures are also important to protect the knee from further injury. Any time any of these exercises cause you to have increased pain or swelling in your knee joint, decrease the amount until you are comfortable again and slowly increase them. If you have problems or questions, call your caregiver or physical therapist for advice.  ° °HOME CARE INSTRUCTIONS  °Remove items at home which could result in a fall. This includes throw rugs or furniture in walking pathways.  °Continue medications as instructed at time of discharge. °You may have some home medications which will be placed on hold until you complete the course of blood thinner medication.  °You may start showering once you are discharged home but do not submerge the incision under water. Just pat the incision dry and apply a dry gauze dressing on daily. °Walk with walker as instructed.  °You may resume a sexual relationship in one month or when given the OK by  your doctor.  °· Use walker as long as suggested by your caregivers. °· Avoid periods of inactivity such as sitting longer than an hour when not asleep. This helps prevent blood clots.  °You may put full weight on your legs and walk as much as is comfortable.  °You may return to work once you are cleared by your doctor.  °Do not drive a car for 6 weeks or until released by you surgeon.  °· Do not drive while taking narcotics.  °Wear the elastic stockings for three weeks following surgery during the day but you may remove then at night. °Make sure you keep all of your appointments after your  operation with all of your doctors and caregivers. You should call the office at the above phone number and make an appointment for approximately two weeks after the date of your surgery. °Change the dressing daily and reapply a dry dressing each time. °Please pick up a stool softener and laxative for home use as long as you are requiring pain medications. °· ICE to the affected knee every three hours for 30 minutes at a time and then as needed for pain and swelling.  Continue to use ice on the knee for pain and swelling from surgery. You may notice swelling that will progress down to the foot and ankle.  This is normal after surgery.  Elevate the leg when you are not up walking on it.   °It is important for you to complete the blood thinner medication as prescribed by your doctor. °· Continue to use the breathing machine which will help keep your temperature down.  It is common for your temperature to cycle up and down following surgery, especially at night when you are not up moving around and exerting yourself.  The breathing machine keeps your lungs expanded and your temperature down. ° °RANGE OF MOTION AND STRENGTHENING EXERCISES  °Rehabilitation of the knee is important following a knee injury or an operation. After just a few days of immobilization, the muscles of the thigh which control the knee become weakened and shrink (atrophy). Knee exercises are designed to build up the tone and strength of the thigh muscles and to improve knee   motion. Often times heat used for twenty to thirty minutes before working out will loosen up your tissues and help with improving the range of motion but do not use heat for the first two weeks following surgery. These exercises can be done on a training (exercise) mat, on the floor, on a table or on a bed. Use what ever works the best and is most comfortable for you Knee exercises include:  Leg Lifts - While your knee is still immobilized in a splint or cast, you can do  straight leg raises. Lift the leg to 60 degrees, hold for 3 sec, and slowly lower the leg. Repeat 10-20 times 2-3 times daily. Perform this exercise against resistance later as your knee gets better.  Quad and Hamstring Sets - Tighten up the muscle on the front of the thigh (Quad) and hold for 5-10 sec. Repeat this 10-20 times hourly. Hamstring sets are done by pushing the foot backward against an object and holding for 5-10 sec. Repeat as with quad sets.  A rehabilitation program following serious knee injuries can speed recovery and prevent re-injury in the future due to weakened muscles. Contact your doctor or a physical therapist for more information on knee rehabilitation.   SKILLED REHAB INSTRUCTIONS: If the patient is transferred to a skilled rehab facility following release from the hospital, a list of the current medications will be sent to the facility for the patient to continue.  When discharged from the skilled rehab facility, please have the facility set up the patient's [place] prior to being released. Also, the skilled facility will be responsible for providing the patient with their medications at time of release from the facility to include their pain medication, the muscle relaxants, and their blood thinner medication. If the patient is still at the rehab facility at time of the two week follow up appointment, the skilled rehab facility will also need to assist the patient in arranging follow up appointment in our office and any transportation needs.  MAKE SURE YOU:  Understand these instructions.  Will watch your condition.  Will get help right away if you are not doing well or get worse.    Pick up stool softner and laxative for home use following surgery while on pain medications. Do not submerge incision under water. Please use good hand washing techniques while changing dressing each day. May shower starting three days after surgery. Please use a clean  towel to pat the incision dry following showers. Continue to use ice for pain and swelling after surgery. Do not use any lotions or creams on the incision until instructed by your surgeon.     Information on my medicine - XARELTO (Rivaroxaban)  This medication education was reviewed with me or my healthcare representative as part of my discharge preparation.  The pharmacist that spoke with me during my hospital stay was:  Angela Adam [facility]  Why was Xarelto prescribed for you? Xarelto was prescribed for you to reduce the risk of blood clots forming after orthopedic surgery. The medical term for these abnormal blood clots is venous thromboembolism (VTE).  What do you need to know about xarelto ? Take your Xarelto ONCE DAILY at the same time every day. You may take it either with or without food.  If you have difficulty swallowing the tablet whole, you may crush it and mix in applesauce just prior to taking your dose.  Take Xarelto exactly as prescribed by your doctor and DO NOT  stop taking Xarelto without talking to the doctor who prescribed the medication.  Stopping without other VTE prevention medication to take the place of Xarelto may increase your risk of developing a clot.  After discharge, you should have regular check-up appointments with your healthcare provider that is prescribing your Xarelto.    What do you do if you miss a dose? If you miss a dose, take it as soon as you remember on the same day then continue your regularly scheduled once daily regimen the next day. Do not take two doses of Xarelto on the same day.   Important Safety Information A possible side effect of Xarelto is bleeding. You should call your healthcare provider right away if you experience any of the following: ? Bleeding from an injury or your nose that does not stop. ? Unusual colored urine (red or dark brown) or unusual colored stools (red or black). ? Unusual bruising for unknown  reasons. ? A serious fall or if you hit your head (even if there is no bleeding).  Some medicines may interact with Xarelto and might increase your risk of bleeding while on Xarelto. To help avoid this, consult your healthcare provider or pharmacist prior to using any new prescription or non-prescription medications, including herbals, vitamins, non-steroidal anti-inflammatory drugs (NSAIDs) and supplements.  This website has more information on Xarelto: [email or web address]

## 2014-12-29 NOTE — Progress Notes (Signed)
CARE MANAGEMENT NOTE 12/29/2014  Patient:  Dylan [name], Dylan [name]   Account Number:  [id number]  Date Initiated:  12/29/2014  Documentation initiated by:  [facility]  Subjective/Objective Assessment:   RIGHT TOTAL KNEE ARTHROPLASTY     Action/Plan:   Anticipated DC Date:  12/30/2014   Anticipated DC Plan:  [place]  CM consult      [facility] Choice  HOME HEALTH   Choice offered to / List presented to:  C-1 Patient        [place] arranged  HH-2 PT      [place]   Status of service:  Completed, signed off Medicare Important Message given?   (If response is "NO", the following Medicare IM given date fields will be blank) Date Medicare IM given:   Medicare IM given by:   Date Additional Medicare IM given:   Additional Medicare IM given by:    Discharge Disposition:  [place]  Per UR Regulation:    If discussed at Long Length of Stay Meetings, dates discussed:    Comments:  12/29/2014 1130 NCM spoke to pt and offered choice for [facility]. Pt agreeable to [facility] for [facility]. Pt states he has RW and 3n1 at home. Jonnie Finner RN CCM Case Mgmt phone [phone]

## 2014-12-29 NOTE — Progress Notes (Signed)
Subjective: 1 Day Post-Op Procedure(s) (LRB): RIGHT TOTAL KNEE ARTHROPLASTY (Right) Patient reports pain as mild.  Doing well with PT.  Objective: Vital signs in last 24 hours: Temp:  [96.9 F (36.1 C)-98.6 F (37 C)] 98.6 F (37 C) (02/13 0505) Pulse Rate:  [54-82] 69 (02/13 0505) Resp:  [15-20] 18 (02/13 0505) BP: (128-178)/(66-91) 128/76 mmHg (02/13 0505) SpO2:  [90 %-100 %] 96 % (02/13 0505)  Intake/Output from previous day: 02/12 0701 - 02/13 0700 In: 4190 [P.O.:840; I.V.:3300; IV Piggyback:50] Out: 2080 [Urine:1975; Drains:30; Blood:75] Intake/Output this shift:     Recent Labs  12/29/14 0500  HGB 13.1    Recent Labs  12/29/14 0500  WBC 14.4*  RBC 4.06*  HCT 38.6*  PLT 192    Recent Labs  12/29/14 0500  NA 139  K 4.4  CL 105  CO2 26  BUN 17  CREATININE 1.07  GLUCOSE 121*  CALCIUM 8.8   No results for input(s): LABPT, INR in the last 72 hours.  PE:  wn wd male in nad.  Knee wound dressed and dry.  Assessment/Plan: 1 Day Post-Op Procedure(s) (LRB): RIGHT TOTAL KNEE ARTHROPLASTY (Right) Up with therapy  Drain removed.  Hopefully home tomorrow.  Wylene Simmer 12/29/2014, 10:03 AM

## 2014-12-29 NOTE — Progress Notes (Signed)
   12/29/14 1600  PT Visit Information  Last PT Received On 12/29/14  Assistance Needed +1  History of Present Illness s/p R TKA; PMHx: L TKA  PT Time Calculation  PT Start Time (ACUTE ONLY) 1429  PT Stop Time (ACUTE ONLY) 1442  PT Time Calculation (min) (ACUTE ONLY) 13 min  Subjective Data  Patient Stated Goal home, return to I  Precautions  Precautions Knee  Required Braces or Orthoses Knee Immobilizer - Right  Knee Immobilizer - Right Discontinue once straight leg raise with < 10 degree lag  Restrictions  Other Position/Activity Restrictions WBAT  Pain Assessment  Pain Assessment 0-10  Pain Score 4  Pain Location right knee  Pain Descriptors / Indicators Aching;Discomfort  Pain Intervention(s) Limited activity within patient's tolerance;Monitored during session;Ice applied;Repositioned;Patient requesting pain meds-RN notified  Cognition  Arousal/Alertness Awake/alert  Behavior During Therapy WFL for tasks assessed/performed  Overall Cognitive Status Within Functional Limits for tasks assessed  Ambulation/Gait  Gait velocity pt reported too much pain and fatigue to get OOB this pm  Total Joint Exercises  Ankle Circles/Pumps AROM;Both;10 reps  Quad Sets AROM;Both;10 reps  Short Arc [employer];Right;10 reps  Heel Slides AAROM;AROM;Right;10 reps  Hip ABduction/ADduction AAROM;AROM;Strengthening;Right;10 reps  Straight Leg Raises AAROM;Strengthening;Right;10 reps  Goniometric ROM ~ -10 to 55*  PT - End of Session  Activity Tolerance Patient limited by fatigue;Patient limited by pain  Patient left in bed;with call bell/phone within reach;with family/visitor present  Nurse Communication Mobility status  PT - Assessment/Plan  PT Plan Current plan remains appropriate  PT Frequency (ACUTE ONLY) 7X/week  Follow Up Recommendations Home health PT  PT equipment None recommended by PT  PT Goal Progression  Progress towards PT goals Progressing toward goals  Acute Rehab PT Goals   PT Goal Formulation With patient  Time For Goal Achievement 01/05/15  Potential to Achieve Goals Good  PT General Charges  $$ ACUTE PT VISIT 1 Procedure  PT Treatments  $Therapeutic Exercise 8-22 mins

## 2014-12-29 NOTE — Progress Notes (Signed)
OT Cancellation Note  Patient Details Name: Dylan [name] MRN: [id number] DOB: [birth year]   Cancelled Treatment:    Reason Eval/Treat Not Completed: OT screened, no needs identified, will sign off. Pt had other knee done 4 years ago and he and wife feel they have BADLs covered and all DME at home.  Almon Register [phone] 12/29/2014, 11:51 AM

## 2014-12-30 LAB — CBC
HEMATOCRIT: 37.1 % — AB (ref 39.0–52.0)
HEMOGLOBIN: 12.3 g/dL — AB (ref 13.0–17.0)
MCH: 31.9 pg (ref 26.0–34.0)
MCHC: 33.2 g/dL (ref 30.0–36.0)
MCV: 96.1 fL (ref 78.0–100.0)
Platelets: 184 10*3/uL (ref 150–400)
RBC: 3.86 MIL/uL — AB (ref 4.22–5.81)
RDW: 12.4 % (ref 11.5–15.5)
WBC: 14.1 10*3/uL — AB (ref 4.0–10.5)

## 2014-12-30 LAB — BASIC METABOLIC PANEL
ANION GAP: 6 (ref 5–15)
BUN: 19 mg/dL (ref 6–23)
CHLORIDE: 104 mmol/L (ref 96–112)
CO2: 29 mmol/L (ref 19–32)
Calcium: 8.9 mg/dL (ref 8.4–10.5)
Creatinine, Ser: 1.18 mg/dL (ref 0.50–1.35)
GFR calc Af Amer: 71 mL/min — ABNORMAL LOW (ref >90)
GFR calc non Af Amer: 61 mL/min — ABNORMAL LOW (ref >90)
Glucose, Bld: 141 mg/dL — ABNORMAL HIGH (ref 70–99)
POTASSIUM: 4.5 mmol/L (ref 3.5–5.1)
SODIUM: 139 mmol/L (ref 135–145)

## 2014-12-30 MED ORDER — METHOCARBAMOL 500 MG PO TABS: 500.0000 mg | ORAL_TABLET | Freq: Four times a day (QID) | ORAL | Status: DC | PRN

## 2014-12-30 MED ORDER — OXYCODONE HCL 5 MG PO TABS: 5.0000 mg | ORAL_TABLET | ORAL | Status: DC | PRN

## 2014-12-30 MED ORDER — RIVAROXABAN 10 MG PO TABS: 10.0000 mg | ORAL_TABLET | Freq: Every day | ORAL | Status: DC

## 2014-12-30 MED ORDER — TRAMADOL HCL 50 MG PO TABS: 50.0000 mg | ORAL_TABLET | Freq: Four times a day (QID) | ORAL | Status: DC | PRN

## 2014-12-30 NOTE — Progress Notes (Signed)
   Subjective: 2 Days Post-Op Procedure(s) (LRB): RIGHT TOTAL KNEE ARTHROPLASTY (Right) Patient reports pain as mild.   Plan is to go Home after hospital stay.  Objective: Vital signs in last 24 hours: Temp:  [98.2 F (36.8 C)-99.3 F (37.4 C)] 98.2 F (36.8 C) (02/14 0555) Pulse Rate:  [67-84] 84 (02/14 0555) Resp:  [16-20] 16 (02/14 0555) BP: (144-175)/(68-83) 165/75 mmHg (02/14 0555) SpO2:  [94 %-98 %] 96 % (02/14 0555)  Intake/Output from previous day:  Intake/Output Summary (Last 24 hours) at 12/30/14 0818 Last data filed at 12/30/14 0555  Gross per 24 hour  Intake      0 ml  Output   1050 ml  Net  -1050 ml    Intake/Output this shift:    Labs:  Recent Labs  12/29/14 0500 12/30/14 0530  HGB 13.1 12.3*    Recent Labs  12/29/14 0500 12/30/14 0530  WBC 14.4* 14.1*  RBC 4.06* 3.86*  HCT 38.6* 37.1*  PLT 192 184    Recent Labs  12/29/14 0500 12/30/14 0530  NA 139 139  K 4.4 4.5  CL 105 104  CO2 26 29  BUN 17 19  CREATININE 1.07 1.18  GLUCOSE 121* 141*  CALCIUM 8.8 8.9   No results for input(s): LABPT, INR in the last 72 hours.  EXAM General - Patient is Alert, Appropriate and Oriented Extremity - Neurologically intact Neurovascular intact Incision: dressing C/D/I No cellulitis present Compartment soft Dressing/Incision - clean, dry, no drainage Motor Function - intact, moving foot and toes well on exam.   Past Medical History  Diagnosis Date  . Hypertension   . Hyperlipemia   . Bowel obstruction   . CAD (coronary artery disease) 10/2009     (NQWMI 90% stenosis in the right coronary artery and otherwise nonobstructive disease.) x 1 stent  . History of kidney stones     multiple   . Cataract     Assessment/Plan: 2 Days Post-Op Procedure(s) (LRB): RIGHT TOTAL KNEE ARTHROPLASTY (Right) Principal Problem:   OA (osteoarthritis) of knee   Discharge home with home health  DVT Prophylaxis - Xarelto Weight-Bearing as tolerated to  right leg  Shanequia Kendrick V 12/30/2014, 8:18 AM

## 2014-12-30 NOTE — Progress Notes (Signed)
Patient discharged to home.  Reviewed discharge instructions with wife, patient said he couldn't remember anything, wife verbalized understanding.  Prescriptions given to wife.  Patient escorted from floor via wheelchair with NT.  Christen Bame RN

## 2014-12-30 NOTE — Progress Notes (Signed)
Physical Therapy Treatment Patient Details Name: Dylan [name] MRN: [id number] DOB: Jun 12, 1946 Today's Date: 12/30/2014    History of Present Illness s/p R TKA; PMHx: L TKA    PT Comments    Progressing very well,pain controlled, feels comfortable with D/C today  Follow Up Recommendations  Home health PT     Equipment Recommendations  None recommended by PT    Recommendations for Other Services       Precautions / Restrictions Precautions Precautions: Knee Required Braces or Orthoses: Knee Immobilizer - Right Knee Immobilizer - Right: Discontinue once straight leg raise with < 10 degree lag Restrictions Other Position/Activity Restrictions: WBAT    Mobility  Bed Mobility Overal bed mobility: Needs Assistance Bed Mobility: Supine to Sit     Supine to sit: Min guard     General bed mobility comments: cues for technique, min to initiate movement RLE  Transfers Overall transfer level: Needs assistance Equipment used: Rolling walker (2 wheeled) Transfers: Sit to/from Stand Sit to Stand: Supervision         General transfer comment: cues for hand placement  Ambulation/Gait Ambulation/Gait assistance: Supervision Ambulation Distance (Feet): 120 Feet Assistive device: Rolling walker (2 wheeled) Gait Pattern/deviations: Step-to pattern;Trunk flexed     General Gait Details: cues for RW position and sequence   Stairs Stairs: Yes Stairs assistance: Min guard Stair Management: One rail Right;Forwards;Step to pattern Number of Stairs: 5 (x2) General stair comments: cues for sequence and safe technique; pt uses one rail at times, have encouraged him to put 2 hands on 1 rail for safety  Wheelchair Mobility    Modified Rankin (Stroke Patients Only)       Balance                                    Cognition Arousal/Alertness: Awake/alert Behavior During Therapy: WFL for tasks assessed/performed Overall Cognitive Status: Within  Functional Limits for tasks assessed                      Exercises Total Joint Exercises Ankle Circles/Pumps: AROM;Both;10 reps Quad Sets: AROM;Both;10 reps Heel Slides: AAROM;AROM;Right;10 reps Straight Leg Raises: AAROM;Strengthening;Right;10 reps Goniometric ROM: ~  -8 to 60*    General Comments        Pertinent Vitals/Pain Pain Assessment: 0-10 Pain Score: 2  Pain Location: R knee Pain Descriptors / Indicators: Aching Pain Intervention(s): Limited activity within patient's tolerance;Monitored during session;Premedicated before session;Ice applied    Home Living                      Prior Function            PT Goals (current goals can now be found in the care plan section) Acute Rehab PT Goals Patient Stated Goal: home, return to I PT Goal Formulation: With patient Time For Goal Achievement: 01/05/15 Potential to Achieve Goals: Good Progress towards PT goals: Progressing toward goals    Frequency  7X/week    PT Plan Current plan remains appropriate    Co-evaluation             End of Session Equipment Utilized During Treatment: Right knee immobilizer Activity Tolerance: Patient tolerated treatment well Patient left: in chair;with call bell/phone within reach     Time: 0901-0931 PT Time Calculation (min) (ACUTE ONLY): 30 min  Charges:  $Gait Training: 23-37 mins  G CodesKenyon Ana 12/30/2014, 9:41 AM

## 2015-01-01 ENCOUNTER — Encounter (HOSPITAL_COMMUNITY): Payer: Self-pay | Admitting: Orthopedic Surgery

## 2015-01-01 DIAGNOSIS — I2510 Atherosclerotic heart disease of native coronary artery without angina pectoris: Secondary | ICD-10-CM | POA: Diagnosis not present

## 2015-01-01 DIAGNOSIS — Z4733 Aftercare following explantation of knee joint prosthesis: Secondary | ICD-10-CM | POA: Diagnosis not present

## 2015-01-01 DIAGNOSIS — Z96651 Presence of right artificial knee joint: Secondary | ICD-10-CM | POA: Diagnosis not present

## 2015-01-01 DIAGNOSIS — I10 Essential (primary) hypertension: Secondary | ICD-10-CM | POA: Diagnosis not present

## 2015-01-01 DIAGNOSIS — E669 Obesity, unspecified: Secondary | ICD-10-CM | POA: Diagnosis not present

## 2015-01-01 DIAGNOSIS — M6281 Muscle weakness (generalized): Secondary | ICD-10-CM | POA: Diagnosis not present

## 2015-01-02 DIAGNOSIS — I2510 Atherosclerotic heart disease of native coronary artery without angina pectoris: Secondary | ICD-10-CM | POA: Diagnosis not present

## 2015-01-02 DIAGNOSIS — I10 Essential (primary) hypertension: Secondary | ICD-10-CM | POA: Diagnosis not present

## 2015-01-02 DIAGNOSIS — Z4733 Aftercare following explantation of knee joint prosthesis: Secondary | ICD-10-CM | POA: Diagnosis not present

## 2015-01-02 DIAGNOSIS — Z96651 Presence of right artificial knee joint: Secondary | ICD-10-CM | POA: Diagnosis not present

## 2015-01-02 DIAGNOSIS — M6281 Muscle weakness (generalized): Secondary | ICD-10-CM | POA: Diagnosis not present

## 2015-01-02 DIAGNOSIS — E669 Obesity, unspecified: Secondary | ICD-10-CM | POA: Diagnosis not present

## 2015-01-04 DIAGNOSIS — E669 Obesity, unspecified: Secondary | ICD-10-CM | POA: Diagnosis not present

## 2015-01-04 DIAGNOSIS — M6281 Muscle weakness (generalized): Secondary | ICD-10-CM | POA: Diagnosis not present

## 2015-01-04 DIAGNOSIS — Z4733 Aftercare following explantation of knee joint prosthesis: Secondary | ICD-10-CM | POA: Diagnosis not present

## 2015-01-04 DIAGNOSIS — Z96651 Presence of right artificial knee joint: Secondary | ICD-10-CM | POA: Diagnosis not present

## 2015-01-04 DIAGNOSIS — I2510 Atherosclerotic heart disease of native coronary artery without angina pectoris: Secondary | ICD-10-CM | POA: Diagnosis not present

## 2015-01-04 DIAGNOSIS — I10 Essential (primary) hypertension: Secondary | ICD-10-CM | POA: Diagnosis not present

## 2015-01-07 DIAGNOSIS — I2510 Atherosclerotic heart disease of native coronary artery without angina pectoris: Secondary | ICD-10-CM | POA: Diagnosis not present

## 2015-01-07 DIAGNOSIS — M6281 Muscle weakness (generalized): Secondary | ICD-10-CM | POA: Diagnosis not present

## 2015-01-07 DIAGNOSIS — I10 Essential (primary) hypertension: Secondary | ICD-10-CM | POA: Diagnosis not present

## 2015-01-07 DIAGNOSIS — Z96651 Presence of right artificial knee joint: Secondary | ICD-10-CM | POA: Diagnosis not present

## 2015-01-07 DIAGNOSIS — E669 Obesity, unspecified: Secondary | ICD-10-CM | POA: Diagnosis not present

## 2015-01-07 DIAGNOSIS — Z4733 Aftercare following explantation of knee joint prosthesis: Secondary | ICD-10-CM | POA: Diagnosis not present

## 2015-01-08 DIAGNOSIS — Z4733 Aftercare following explantation of knee joint prosthesis: Secondary | ICD-10-CM | POA: Diagnosis not present

## 2015-01-08 DIAGNOSIS — I10 Essential (primary) hypertension: Secondary | ICD-10-CM | POA: Diagnosis not present

## 2015-01-08 DIAGNOSIS — M6281 Muscle weakness (generalized): Secondary | ICD-10-CM | POA: Diagnosis not present

## 2015-01-08 DIAGNOSIS — E669 Obesity, unspecified: Secondary | ICD-10-CM | POA: Diagnosis not present

## 2015-01-08 DIAGNOSIS — Z96651 Presence of right artificial knee joint: Secondary | ICD-10-CM | POA: Diagnosis not present

## 2015-01-08 DIAGNOSIS — I2510 Atherosclerotic heart disease of native coronary artery without angina pectoris: Secondary | ICD-10-CM | POA: Diagnosis not present

## 2015-01-10 DIAGNOSIS — M6281 Muscle weakness (generalized): Secondary | ICD-10-CM | POA: Diagnosis not present

## 2015-01-10 DIAGNOSIS — I2510 Atherosclerotic heart disease of native coronary artery without angina pectoris: Secondary | ICD-10-CM | POA: Diagnosis not present

## 2015-01-10 DIAGNOSIS — Z96651 Presence of right artificial knee joint: Secondary | ICD-10-CM | POA: Diagnosis not present

## 2015-01-10 DIAGNOSIS — Z4733 Aftercare following explantation of knee joint prosthesis: Secondary | ICD-10-CM | POA: Diagnosis not present

## 2015-01-10 DIAGNOSIS — I10 Essential (primary) hypertension: Secondary | ICD-10-CM | POA: Diagnosis not present

## 2015-01-10 DIAGNOSIS — E669 Obesity, unspecified: Secondary | ICD-10-CM | POA: Diagnosis not present

## 2015-01-15 DIAGNOSIS — Z471 Aftercare following joint replacement surgery: Secondary | ICD-10-CM | POA: Diagnosis not present

## 2015-01-15 DIAGNOSIS — Z96651 Presence of right artificial knee joint: Secondary | ICD-10-CM | POA: Diagnosis not present

## 2015-01-15 DIAGNOSIS — R2689 Other abnormalities of gait and mobility: Secondary | ICD-10-CM | POA: Diagnosis not present

## 2015-01-16 NOTE — Discharge Summary (Signed)
Physician Discharge Summary   Patient ID: Dylan [name] MRN: [id number] DOB/AGE: 1946/03/01 69 y.o.  Admit date: 12/28/2014 Discharge date: 12/30/2014  Primary Diagnosis:  Osteoarthritis Right knee(s)  Admission Diagnoses:  Past Medical History  Diagnosis Date  . Hypertension   . Hyperlipemia   . Bowel obstruction   . CAD (coronary artery disease) 10/2009     (NQWMI 90% stenosis in the right coronary artery and otherwise nonobstructive disease.) x 1 stent  . History of kidney stones     multiple   . Cataract    Discharge Diagnoses:   Principal Problem:   OA (osteoarthritis) of knee  Estimated body mass index is 31.19 kg/(m^2) as calculated from the following:   Height as of this encounter: 6' (1.829 m).   Weight as of this encounter: 104.327 kg (230 lb).  Procedure:  Procedure(s) (LRB): RIGHT TOTAL KNEE ARTHROPLASTY (Right)   Consults: None  HPI: Dylan [name] is a 69 y.o. year old male with end stage OA of his right knee with progressively worsening pain and dysfunction. He has constant pain, with activity and at rest and significant functional deficits with difficulties even with ADLs. He has had extensive non-op management including analgesics, injections of cortisone and viscosupplements, and home exercise program, but remains in significant pain with significant dysfunction. Radiographs show bone on bone arthritis medial and patellofemoral. He presents now for right Total Knee Arthroplasty.   Laboratory Data: Admission on 12/28/2014, Discharged on 12/30/2014  Component Date Value Ref Range Status  . ABO/RH(D) 12/28/2014 B POS   Final  . Antibody Screen 12/28/2014 NEG   Final  . Sample Expiration 12/28/2014 12/31/2014   Final  . WBC 12/29/2014 14.4* 4.0 - 10.5 K/uL Final  . RBC 12/29/2014 4.06* 4.22 - 5.81 MIL/uL Final  . Hemoglobin 12/29/2014 13.1  13.0 - 17.0 g/dL Final  . HCT 12/29/2014 38.6* 39.0 - 52.0 % Final  . MCV 12/29/2014 95.1  78.0 - 100.0 fL Final    . MCH 12/29/2014 32.3  26.0 - 34.0 pg Final  . MCHC 12/29/2014 33.9  30.0 - 36.0 g/dL Final  . RDW 12/29/2014 12.2  11.5 - 15.5 % Final  . Platelets 12/29/2014 192  150 - 400 K/uL Final  . Sodium 12/29/2014 139  135 - 145 mmol/L Final  . Potassium 12/29/2014 4.4  3.5 - 5.1 mmol/L Final  . Chloride 12/29/2014 105  96 - 112 mmol/L Final  . CO2 12/29/2014 26  19 - 32 mmol/L Final  . Glucose, Bld 12/29/2014 121* 70 - 99 mg/dL Final  . BUN 12/29/2014 17  6 - 23 mg/dL Final  . Creatinine, Ser 12/29/2014 1.07  0.50 - 1.35 mg/dL Final  . Calcium 12/29/2014 8.8  8.4 - 10.5 mg/dL Final  . GFR calc non Af Amer 12/29/2014 69* >90 mL/min Final  . GFR calc Af Amer 12/29/2014 80* >90 mL/min Final   Comment: (NOTE) The eGFR has been calculated using the CKD EPI equation. This calculation has not been validated in all clinical situations. eGFR's persistently <90 mL/min signify possible Chronic Kidney Disease.   . Anion gap 12/29/2014 8  5 - 15 Final  . WBC 12/30/2014 14.1* 4.0 - 10.5 K/uL Final  . RBC 12/30/2014 3.86* 4.22 - 5.81 MIL/uL Final  . Hemoglobin 12/30/2014 12.3* 13.0 - 17.0 g/dL Final  . HCT 12/30/2014 37.1* 39.0 - 52.0 % Final  . MCV 12/30/2014 96.1  78.0 - 100.0 fL Final  . MCH 12/30/2014 31.9  26.0 -  34.0 pg Final  . MCHC 12/30/2014 33.2  30.0 - 36.0 g/dL Final  . RDW 12/30/2014 12.4  11.5 - 15.5 % Final  . Platelets 12/30/2014 184  150 - 400 K/uL Final  . Sodium 12/30/2014 139  135 - 145 mmol/L Final  . Potassium 12/30/2014 4.5  3.5 - 5.1 mmol/L Final  . Chloride 12/30/2014 104  96 - 112 mmol/L Final  . CO2 12/30/2014 29  19 - 32 mmol/L Final  . Glucose, Bld 12/30/2014 141* 70 - 99 mg/dL Final  . BUN 12/30/2014 19  6 - 23 mg/dL Final  . Creatinine, Ser 12/30/2014 1.18  0.50 - 1.35 mg/dL Final  . Calcium 12/30/2014 8.9  8.4 - 10.5 mg/dL Final  . GFR calc non Af Amer 12/30/2014 61* >90 mL/min Final  . GFR calc Af Amer 12/30/2014 71* >90 mL/min Final   Comment: (NOTE) The eGFR  has been calculated using the CKD EPI equation. This calculation has not been validated in all clinical situations. eGFR's persistently <90 mL/min signify possible Chronic Kidney Disease.   Georgiann Hahn gap 12/30/2014 6  5 - 15 Final  Hospital Outpatient Visit on 12/21/2014  Component Date Value Ref Range Status  . MRSA, PCR 12/21/2014 NEGATIVE  NEGATIVE Final  . Staphylococcus aureus 12/21/2014 NEGATIVE  NEGATIVE Final   Comment:        The Xpert SA Assay (FDA approved for NASAL specimens in patients over 43 years of age), is one component of a comprehensive surveillance program.  Test performance has been validated by [facility] for patients greater than or equal to 15 year old. It is not intended to diagnose infection nor to guide or monitor treatment.   Marland Kitchen aPTT 12/21/2014 29  24 - 37 seconds Final  . WBC 12/21/2014 5.5  4.0 - 10.5 K/uL Final  . RBC 12/21/2014 4.35  4.22 - 5.81 MIL/uL Final  . Hemoglobin 12/21/2014 14.0  13.0 - 17.0 g/dL Final  . HCT 12/21/2014 41.5  39.0 - 52.0 % Final  . MCV 12/21/2014 95.4  78.0 - 100.0 fL Final  . MCH 12/21/2014 32.2  26.0 - 34.0 pg Final  . MCHC 12/21/2014 33.7  30.0 - 36.0 g/dL Final  . RDW 12/21/2014 12.2  11.5 - 15.5 % Final  . Platelets 12/21/2014 144* 150 - 400 K/uL Final  . Sodium 12/21/2014 143  135 - 145 mmol/L Final  . Potassium 12/21/2014 4.5  3.5 - 5.1 mmol/L Final  . Chloride 12/21/2014 107  96 - 112 mmol/L Final  . CO2 12/21/2014 29  19 - 32 mmol/L Final  . Glucose, Bld 12/21/2014 101* 70 - 99 mg/dL Final  . BUN 12/21/2014 14  6 - 23 mg/dL Final  . Creatinine, Ser 12/21/2014 1.18  0.50 - 1.35 mg/dL Final  . Calcium 12/21/2014 9.4  8.4 - 10.5 mg/dL Final  . Total Protein 12/21/2014 6.9  6.0 - 8.3 g/dL Final  . Albumin 12/21/2014 4.2  3.5 - 5.2 g/dL Final  . AST 12/21/2014 32  0 - 37 U/L Final  . ALT 12/21/2014 25  0 - 53 U/L Final  . Alkaline Phosphatase 12/21/2014 84  39 - 117 U/L Final  . Total Bilirubin 12/21/2014 0.9   0.3 - 1.2 mg/dL Final  . GFR calc non Af Amer 12/21/2014 61* >90 mL/min Final  . GFR calc Af Amer 12/21/2014 71* >90 mL/min Final   Comment: (NOTE) The eGFR has been calculated using the CKD EPI equation. This calculation has  not been validated in all clinical situations. eGFR's persistently <90 mL/min signify possible Chronic Kidney Disease.   . Anion gap 12/21/2014 7  5 - 15 Final  . Prothrombin Time 12/21/2014 13.3  11.6 - 15.2 seconds Final  . INR 12/21/2014 1.00  0.00 - 1.49 Final  . Color, Urine 12/21/2014 YELLOW  YELLOW Final  . APPearance 12/21/2014 CLEAR  CLEAR Final  . Specific Gravity, Urine 12/21/2014 1.013  1.005 - 1.030 Final  . pH 12/21/2014 6.0  5.0 - 8.0 Final  . Glucose, UA 12/21/2014 NEGATIVE  NEGATIVE mg/dL Final  . Hgb urine dipstick 12/21/2014 NEGATIVE  NEGATIVE Final  . Bilirubin Urine 12/21/2014 NEGATIVE  NEGATIVE Final  . Ketones, ur 12/21/2014 NEGATIVE  NEGATIVE mg/dL Final  . Protein, ur 12/21/2014 NEGATIVE  NEGATIVE mg/dL Final  . Urobilinogen, UA 12/21/2014 1.0  0.0 - 1.0 mg/dL Final  . Nitrite 12/21/2014 NEGATIVE  NEGATIVE Final  . Leukocytes, UA 12/21/2014 NEGATIVE  NEGATIVE Final   MICROSCOPIC NOT DONE ON URINES WITH NEGATIVE PROTEIN, BLOOD, LEUKOCYTES, NITRITE, OR GLUCOSE <1000 mg/dL.     X-Rays:No results found.  EKG: Orders placed or performed in visit on 11/27/14  . EKG 12-Lead     Hospital Course: Dylan [name] is a 69 y.o. who was admitted to [facility]. They were brought to the operating room on 12/28/2014 and underwent Procedure(s): RIGHT TOTAL KNEE ARTHROPLASTY.  Patient tolerated the procedure well and was later transferred to the recovery room and then to the orthopaedic floor for postoperative care.  They were given PO and IV analgesics for pain control following their surgery.  They were given 24 hours of postoperative antibiotics of      Anti-infectives    Start     Dose/Rate Route Frequency Ordered Stop   12/28/14 1800   penicillin v potassium (VEETID) tablet 500 mg  Status:  Discontinued     500 mg Oral 4 times daily 12/28/14 1512 12/30/14 1729   12/28/14 1800  ceFAZolin (ANCEF) IVPB 2 g/50 mL premix     2 g 100 mL/hr over 30 Minutes Intravenous Every 6 hours 12/28/14 1512 12/28/14 2331   12/28/14 0854  ceFAZolin (ANCEF) IVPB 2 g/50 mL premix     2 g 100 mL/hr over 30 Minutes Intravenous On call to O.R. 12/28/14 [id number] 12/28/14 1143     and started on DVT prophylaxis in the form of Xarelto.   PT and OT were ordered for total joint protocol.  Discharge planning consulted to help with postop disposition and equipment needs.  Patient had a decent night on the evening of surgery.  They started to get up OOB with therapy on day one. Hemovac drain was pulled without difficulty.  Continued to work with therapy into day two.  Dressing was changed on day two and the incision wa2s healing well. Patient was seen in rounds and was ready to go home.   Diet: Cardiac diet Activity:WBAT Follow-up:in 2 weeks Disposition - Home Discharged Condition: good    Medication List    STOP taking these medications        clopidogrel 75 MG tablet  Commonly known as:  PLAVIX     ibuprofen 200 MG tablet  Commonly known as:  ADVIL,MOTRIN      TAKE these medications        acetaminophen 500 MG tablet  Commonly known as:  TYLENOL  Take 500 mg by mouth every 6 (six) hours as needed.     aspirin  EC 81 MG tablet  Take 81 mg by mouth daily.     atorvastatin 40 MG tablet  Commonly known as:  LIPITOR  Take 40 mg by mouth daily.     methocarbamol 500 MG tablet  Commonly known as:  ROBAXIN  Take 1 tablet (500 mg total) by mouth every 6 (six) hours as needed for muscle spasms.     nitroGLYCERIN 0.4 MG SL tablet  Commonly known as:  NITROSTAT  DISSOLVE ONE TABLET UNDER THE TONGUE EVERY 5 MINUTES AS NEEDED FOR CHEST PAIN.  DO NOT EXCEED A TOTAL OF 3 DOSES IN 15 MINUTES     oxyCODONE 5 MG immediate release tablet  Commonly  known as:  Oxy IR/ROXICODONE  Take 1-2 tablets (5-10 mg total) by mouth every 3 (three) hours as needed for breakthrough pain.     pantoprazole 40 MG tablet  Commonly known as:  PROTONIX  Take 40 mg by mouth daily.     penicillin v potassium 500 MG tablet  Commonly known as:  VEETID  Take 500 mg by mouth 4 (four) times daily.     rivaroxaban 10 MG Tabs tablet  Commonly known as:  XARELTO  Take 1 tablet (10 mg total) by mouth daily with breakfast.     traMADol 50 MG tablet  Commonly known as:  ULTRAM  Take 1-2 tablets (50-100 mg total) by mouth every 6 (six) hours as needed for moderate pain.       Follow-up Information    Follow up with [facility].   Why:  Home Health Physical Therapy   Contact information:   [address] [place] [address] [phone]       Follow up with Gearlean Alf, MD. Schedule an appointment as soon as possible for a visit on 01/10/2015.   Specialty:  Orthopedic Surgery   Why:  Call [phone] Monday to make the appointment   Contact information:   [address] [place] [address] [phone]       Signed: Arlee Muslim, PA-C Orthopaedic Surgery 01/16/2015, 9:37 PM

## 2015-01-18 DIAGNOSIS — R2689 Other abnormalities of gait and mobility: Secondary | ICD-10-CM | POA: Diagnosis not present

## 2015-01-18 DIAGNOSIS — Z471 Aftercare following joint replacement surgery: Secondary | ICD-10-CM | POA: Diagnosis not present

## 2015-01-18 DIAGNOSIS — Z96651 Presence of right artificial knee joint: Secondary | ICD-10-CM | POA: Diagnosis not present

## 2015-01-21 DIAGNOSIS — Z96651 Presence of right artificial knee joint: Secondary | ICD-10-CM | POA: Diagnosis not present

## 2015-01-21 DIAGNOSIS — R2689 Other abnormalities of gait and mobility: Secondary | ICD-10-CM | POA: Diagnosis not present

## 2015-01-21 DIAGNOSIS — Z471 Aftercare following joint replacement surgery: Secondary | ICD-10-CM | POA: Diagnosis not present

## 2015-01-24 DIAGNOSIS — Z96651 Presence of right artificial knee joint: Secondary | ICD-10-CM | POA: Diagnosis not present

## 2015-01-24 DIAGNOSIS — Z471 Aftercare following joint replacement surgery: Secondary | ICD-10-CM | POA: Diagnosis not present

## 2015-01-24 DIAGNOSIS — R2689 Other abnormalities of gait and mobility: Secondary | ICD-10-CM | POA: Diagnosis not present

## 2015-01-28 DIAGNOSIS — Z471 Aftercare following joint replacement surgery: Secondary | ICD-10-CM | POA: Diagnosis not present

## 2015-01-28 DIAGNOSIS — R2689 Other abnormalities of gait and mobility: Secondary | ICD-10-CM | POA: Diagnosis not present

## 2015-01-28 DIAGNOSIS — Z96651 Presence of right artificial knee joint: Secondary | ICD-10-CM | POA: Diagnosis not present

## 2015-01-29 DIAGNOSIS — Z471 Aftercare following joint replacement surgery: Secondary | ICD-10-CM | POA: Diagnosis not present

## 2015-01-29 DIAGNOSIS — Z96651 Presence of right artificial knee joint: Secondary | ICD-10-CM | POA: Diagnosis not present

## 2015-02-12 ENCOUNTER — Telehealth: Payer: Self-pay | Admitting: Cardiology

## 2015-02-12 NOTE — Telephone Encounter (Signed)
Unable to locate fax or any documentation r/t this. Routed to Hilda Blades to see if she has on-hand.

## 2015-02-12 NOTE — Telephone Encounter (Signed)
Pt's wife called in wanting to know if a fax regarding his surgical clearance was received because his Orthopedic doctor would like to know if his plavix and aspirin can be held for his surgery. Please f/u  Thanks

## 2015-02-13 ENCOUNTER — Encounter: Payer: Self-pay | Admitting: *Deleted

## 2015-02-13 NOTE — Telephone Encounter (Signed)
Spoke with pt wife, the patient was given the clearance for the procedure in January. They are requesting another clearance because the procedure has not been done yet. Clearance letter generated as per previous clearance(scanned into media tab) faxed to [phone].

## 2015-02-19 DIAGNOSIS — K219 Gastro-esophageal reflux disease without esophagitis: Secondary | ICD-10-CM | POA: Diagnosis not present

## 2015-02-19 DIAGNOSIS — Z7982 Long term (current) use of aspirin: Secondary | ICD-10-CM | POA: Diagnosis not present

## 2015-02-19 DIAGNOSIS — I10 Essential (primary) hypertension: Secondary | ICD-10-CM | POA: Diagnosis not present

## 2015-02-19 DIAGNOSIS — I252 Old myocardial infarction: Secondary | ICD-10-CM | POA: Diagnosis not present

## 2015-02-19 DIAGNOSIS — Z91013 Allergy to seafood: Secondary | ICD-10-CM | POA: Diagnosis not present

## 2015-02-19 DIAGNOSIS — E785 Hyperlipidemia, unspecified: Secondary | ICD-10-CM | POA: Diagnosis not present

## 2015-02-19 DIAGNOSIS — R312 Other microscopic hematuria: Secondary | ICD-10-CM | POA: Diagnosis not present

## 2015-02-19 DIAGNOSIS — R3915 Urgency of urination: Secondary | ICD-10-CM | POA: Diagnosis not present

## 2015-02-19 DIAGNOSIS — M1990 Unspecified osteoarthritis, unspecified site: Secondary | ICD-10-CM | POA: Diagnosis not present

## 2015-02-19 DIAGNOSIS — Z955 Presence of coronary angioplasty implant and graft: Secondary | ICD-10-CM | POA: Diagnosis not present

## 2015-02-19 DIAGNOSIS — Z87442 Personal history of urinary calculi: Secondary | ICD-10-CM | POA: Diagnosis not present

## 2015-02-19 DIAGNOSIS — N471 Phimosis: Secondary | ICD-10-CM | POA: Diagnosis not present

## 2015-02-19 DIAGNOSIS — F17200 Nicotine dependence, unspecified, uncomplicated: Secondary | ICD-10-CM | POA: Diagnosis not present

## 2015-02-19 DIAGNOSIS — Z96651 Presence of right artificial knee joint: Secondary | ICD-10-CM | POA: Diagnosis not present

## 2015-02-19 DIAGNOSIS — Z7902 Long term (current) use of antithrombotics/antiplatelets: Secondary | ICD-10-CM | POA: Diagnosis not present

## 2015-02-19 DIAGNOSIS — Z79899 Other long term (current) drug therapy: Secondary | ICD-10-CM | POA: Diagnosis not present

## 2015-02-19 DIAGNOSIS — R3919 Other difficulties with micturition: Secondary | ICD-10-CM | POA: Diagnosis not present

## 2015-02-20 DIAGNOSIS — I10 Essential (primary) hypertension: Secondary | ICD-10-CM | POA: Diagnosis not present

## 2015-02-21 DIAGNOSIS — Z87442 Personal history of urinary calculi: Secondary | ICD-10-CM | POA: Diagnosis not present

## 2015-02-21 DIAGNOSIS — K219 Gastro-esophageal reflux disease without esophagitis: Secondary | ICD-10-CM | POA: Diagnosis not present

## 2015-02-21 DIAGNOSIS — Z79899 Other long term (current) drug therapy: Secondary | ICD-10-CM | POA: Diagnosis not present

## 2015-02-21 DIAGNOSIS — Z91013 Allergy to seafood: Secondary | ICD-10-CM | POA: Diagnosis not present

## 2015-02-21 DIAGNOSIS — Z955 Presence of coronary angioplasty implant and graft: Secondary | ICD-10-CM | POA: Diagnosis not present

## 2015-02-21 DIAGNOSIS — Z7982 Long term (current) use of aspirin: Secondary | ICD-10-CM | POA: Diagnosis not present

## 2015-02-21 DIAGNOSIS — R312 Other microscopic hematuria: Secondary | ICD-10-CM | POA: Diagnosis not present

## 2015-02-21 DIAGNOSIS — R3915 Urgency of urination: Secondary | ICD-10-CM | POA: Diagnosis not present

## 2015-02-21 DIAGNOSIS — F17200 Nicotine dependence, unspecified, uncomplicated: Secondary | ICD-10-CM | POA: Diagnosis not present

## 2015-02-21 DIAGNOSIS — E785 Hyperlipidemia, unspecified: Secondary | ICD-10-CM | POA: Diagnosis not present

## 2015-02-21 DIAGNOSIS — Z7902 Long term (current) use of antithrombotics/antiplatelets: Secondary | ICD-10-CM | POA: Diagnosis not present

## 2015-02-21 DIAGNOSIS — M1990 Unspecified osteoarthritis, unspecified site: Secondary | ICD-10-CM | POA: Diagnosis not present

## 2015-02-21 DIAGNOSIS — I252 Old myocardial infarction: Secondary | ICD-10-CM | POA: Diagnosis not present

## 2015-02-21 DIAGNOSIS — Z96651 Presence of right artificial knee joint: Secondary | ICD-10-CM | POA: Diagnosis not present

## 2015-02-21 DIAGNOSIS — R3919 Other difficulties with micturition: Secondary | ICD-10-CM | POA: Diagnosis not present

## 2015-02-21 DIAGNOSIS — I10 Essential (primary) hypertension: Secondary | ICD-10-CM | POA: Diagnosis not present

## 2015-02-21 DIAGNOSIS — N471 Phimosis: Secondary | ICD-10-CM | POA: Diagnosis not present

## 2015-02-26 DIAGNOSIS — N480 Leukoplakia of penis: Secondary | ICD-10-CM | POA: Diagnosis not present

## 2015-03-12 DIAGNOSIS — Z471 Aftercare following joint replacement surgery: Secondary | ICD-10-CM | POA: Diagnosis not present

## 2015-03-12 DIAGNOSIS — Z96651 Presence of right artificial knee joint: Secondary | ICD-10-CM | POA: Diagnosis not present

## 2015-04-25 DIAGNOSIS — I2510 Atherosclerotic heart disease of native coronary artery without angina pectoris: Secondary | ICD-10-CM | POA: Diagnosis not present

## 2015-04-25 DIAGNOSIS — R079 Chest pain, unspecified: Secondary | ICD-10-CM | POA: Diagnosis not present

## 2015-05-10 DIAGNOSIS — E782 Mixed hyperlipidemia: Secondary | ICD-10-CM | POA: Diagnosis not present

## 2015-05-10 DIAGNOSIS — I10 Essential (primary) hypertension: Secondary | ICD-10-CM | POA: Diagnosis not present

## 2015-05-14 DIAGNOSIS — Z0001 Encounter for general adult medical examination with abnormal findings: Secondary | ICD-10-CM | POA: Diagnosis not present

## 2015-05-14 DIAGNOSIS — Z1389 Encounter for screening for other disorder: Secondary | ICD-10-CM | POA: Diagnosis not present

## 2015-05-14 DIAGNOSIS — E782 Mixed hyperlipidemia: Secondary | ICD-10-CM | POA: Diagnosis not present

## 2015-05-16 ENCOUNTER — Telehealth: Payer: Self-pay | Admitting: Cardiology

## 2015-05-16 NOTE — Telephone Encounter (Signed)
Forwarding to Dr. Percival Spanish as he was last seen 11/27/2014 at [facility] office by him.  Recall due for July 2017 [facility].

## 2015-05-16 NOTE — Telephone Encounter (Signed)
Patient is OK to drive from a cardiovascular standpoint.  Negative stress test in 2014 and no symptoms at last visit.

## 2015-05-16 NOTE — Telephone Encounter (Signed)
Mr. Lorey was seen today at the [facility] today.  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

## 2015-05-17 NOTE — Telephone Encounter (Signed)
Patient notified.  Stated that Dr. Quillian Quince had already taken care of it, they were satisfied with PMD note.  Patient appreciative for the prompt call back.

## 2015-05-21 DIAGNOSIS — N200 Calculus of kidney: Secondary | ICD-10-CM | POA: Diagnosis not present

## 2015-05-21 DIAGNOSIS — I714 Abdominal aortic aneurysm, without rupture: Secondary | ICD-10-CM | POA: Diagnosis not present

## 2015-06-19 DIAGNOSIS — J301 Allergic rhinitis due to pollen: Secondary | ICD-10-CM | POA: Diagnosis not present

## 2015-06-19 DIAGNOSIS — J019 Acute sinusitis, unspecified: Secondary | ICD-10-CM | POA: Diagnosis not present

## 2015-06-27 DIAGNOSIS — R05 Cough: Secondary | ICD-10-CM | POA: Diagnosis not present

## 2015-06-27 DIAGNOSIS — J019 Acute sinusitis, unspecified: Secondary | ICD-10-CM | POA: Diagnosis not present

## 2015-06-27 DIAGNOSIS — Z87891 Personal history of nicotine dependence: Secondary | ICD-10-CM | POA: Diagnosis not present

## 2015-06-27 DIAGNOSIS — J301 Allergic rhinitis due to pollen: Secondary | ICD-10-CM | POA: Diagnosis not present

## 2015-07-05 DIAGNOSIS — I952 Hypotension due to drugs: Secondary | ICD-10-CM | POA: Diagnosis not present

## 2015-07-05 DIAGNOSIS — I10 Essential (primary) hypertension: Secondary | ICD-10-CM | POA: Diagnosis not present

## 2015-07-05 DIAGNOSIS — R5383 Other fatigue: Secondary | ICD-10-CM | POA: Diagnosis not present

## 2015-07-05 DIAGNOSIS — R42 Dizziness and giddiness: Secondary | ICD-10-CM | POA: Diagnosis not present

## 2015-09-09 DIAGNOSIS — Z23 Encounter for immunization: Secondary | ICD-10-CM | POA: Diagnosis not present

## 2015-10-21 DIAGNOSIS — I10 Essential (primary) hypertension: Secondary | ICD-10-CM | POA: Diagnosis not present

## 2015-10-21 DIAGNOSIS — E782 Mixed hyperlipidemia: Secondary | ICD-10-CM | POA: Diagnosis not present

## 2015-10-28 DIAGNOSIS — E782 Mixed hyperlipidemia: Secondary | ICD-10-CM | POA: Diagnosis not present

## 2015-10-28 DIAGNOSIS — M170 Bilateral primary osteoarthritis of knee: Secondary | ICD-10-CM | POA: Diagnosis not present

## 2015-10-28 DIAGNOSIS — F5221 Male erectile disorder: Secondary | ICD-10-CM | POA: Diagnosis not present

## 2015-10-28 DIAGNOSIS — I10 Essential (primary) hypertension: Secondary | ICD-10-CM | POA: Diagnosis not present

## 2015-10-28 DIAGNOSIS — J301 Allergic rhinitis due to pollen: Secondary | ICD-10-CM | POA: Diagnosis not present

## 2015-12-09 DIAGNOSIS — I7789 Other specified disorders of arteries and arterioles: Secondary | ICD-10-CM | POA: Diagnosis not present

## 2015-12-09 DIAGNOSIS — R39198 Other difficulties with micturition: Secondary | ICD-10-CM | POA: Diagnosis not present

## 2015-12-09 DIAGNOSIS — N200 Calculus of kidney: Secondary | ICD-10-CM | POA: Diagnosis not present

## 2015-12-09 DIAGNOSIS — R3129 Other microscopic hematuria: Secondary | ICD-10-CM | POA: Diagnosis not present

## 2015-12-16 DIAGNOSIS — K5901 Slow transit constipation: Secondary | ICD-10-CM | POA: Diagnosis not present

## 2015-12-16 DIAGNOSIS — L570 Actinic keratosis: Secondary | ICD-10-CM | POA: Diagnosis not present

## 2016-01-03 DIAGNOSIS — Z96651 Presence of right artificial knee joint: Secondary | ICD-10-CM | POA: Diagnosis not present

## 2016-01-03 DIAGNOSIS — Z471 Aftercare following joint replacement surgery: Secondary | ICD-10-CM | POA: Diagnosis not present

## 2016-05-21 DIAGNOSIS — I10 Essential (primary) hypertension: Secondary | ICD-10-CM | POA: Diagnosis not present

## 2016-05-21 DIAGNOSIS — E782 Mixed hyperlipidemia: Secondary | ICD-10-CM | POA: Diagnosis not present

## 2016-05-21 DIAGNOSIS — I25111 Atherosclerotic heart disease of native coronary artery with angina pectoris with documented spasm: Secondary | ICD-10-CM | POA: Diagnosis not present

## 2016-05-21 DIAGNOSIS — R5383 Other fatigue: Secondary | ICD-10-CM | POA: Diagnosis not present

## 2016-05-21 DIAGNOSIS — I952 Hypotension due to drugs: Secondary | ICD-10-CM | POA: Diagnosis not present

## 2016-05-28 DIAGNOSIS — Z1212 Encounter for screening for malignant neoplasm of rectum: Secondary | ICD-10-CM | POA: Diagnosis not present

## 2016-05-28 DIAGNOSIS — I10 Essential (primary) hypertension: Secondary | ICD-10-CM | POA: Diagnosis not present

## 2016-05-28 DIAGNOSIS — M170 Bilateral primary osteoarthritis of knee: Secondary | ICD-10-CM | POA: Diagnosis not present

## 2016-05-28 DIAGNOSIS — Z0001 Encounter for general adult medical examination with abnormal findings: Secondary | ICD-10-CM | POA: Diagnosis not present

## 2016-05-28 DIAGNOSIS — E782 Mixed hyperlipidemia: Secondary | ICD-10-CM | POA: Diagnosis not present

## 2016-08-14 ENCOUNTER — Encounter: Payer: Self-pay | Admitting: Cardiology

## 2016-08-31 ENCOUNTER — Ambulatory Visit: Payer: Medicare Other | Admitting: Cardiology

## 2016-09-28 DIAGNOSIS — R05 Cough: Secondary | ICD-10-CM | POA: Diagnosis not present

## 2016-09-28 DIAGNOSIS — J40 Bronchitis, not specified as acute or chronic: Secondary | ICD-10-CM | POA: Diagnosis not present

## 2016-10-26 DIAGNOSIS — I10 Essential (primary) hypertension: Secondary | ICD-10-CM | POA: Diagnosis not present

## 2016-10-26 DIAGNOSIS — E782 Mixed hyperlipidemia: Secondary | ICD-10-CM | POA: Diagnosis not present

## 2016-10-26 DIAGNOSIS — Z9189 Other specified personal risk factors, not elsewhere classified: Secondary | ICD-10-CM | POA: Diagnosis not present

## 2016-10-28 DIAGNOSIS — I10 Essential (primary) hypertension: Secondary | ICD-10-CM | POA: Diagnosis not present

## 2016-10-28 DIAGNOSIS — J301 Allergic rhinitis due to pollen: Secondary | ICD-10-CM | POA: Diagnosis not present

## 2016-10-28 DIAGNOSIS — M170 Bilateral primary osteoarthritis of knee: Secondary | ICD-10-CM | POA: Diagnosis not present

## 2016-10-28 DIAGNOSIS — E782 Mixed hyperlipidemia: Secondary | ICD-10-CM | POA: Diagnosis not present

## 2016-12-08 DIAGNOSIS — N200 Calculus of kidney: Secondary | ICD-10-CM | POA: Diagnosis not present

## 2016-12-14 ENCOUNTER — Telehealth: Payer: Self-pay | Admitting: Cardiology

## 2016-12-14 NOTE — Telephone Encounter (Signed)
Patient would like to transfer to [place] from Dr Percival Spanish  If he is not able to see him in [place]

## 2016-12-15 DIAGNOSIS — N200 Calculus of kidney: Secondary | ICD-10-CM | POA: Diagnosis not present

## 2016-12-17 ENCOUNTER — Ambulatory Visit: Payer: Medicare Other | Admitting: Physician Assistant

## 2016-12-18 ENCOUNTER — Ambulatory Visit: Payer: Self-pay | Admitting: Physician Assistant

## 2016-12-22 IMAGING — US US AORTA SCREENING (MEDICARE)
1 series · 11 of 11 positions shown · non-contrast
Comparison: CT abdomen and pelvis 06/13/2012

CLINICAL DATA: Medicare screening exam for abdominal aortic
aneurysm.

EXAM:
ABDOMINAL AORTA SCREENING ULTRASOUND
TECHNIQUE: Ultrasound examination of the abdominal aorta was performed as a
screening evaluation for abdominal aortic aneurysm.

[Series 1: us aorta screening (medicare) · 0.26mm/px · 11 of 11 slices shown]
[im 1/11]
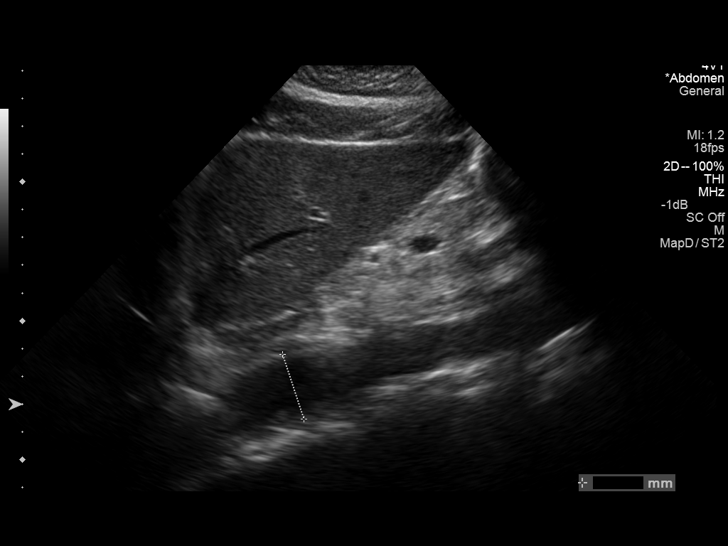
[im 2/11]
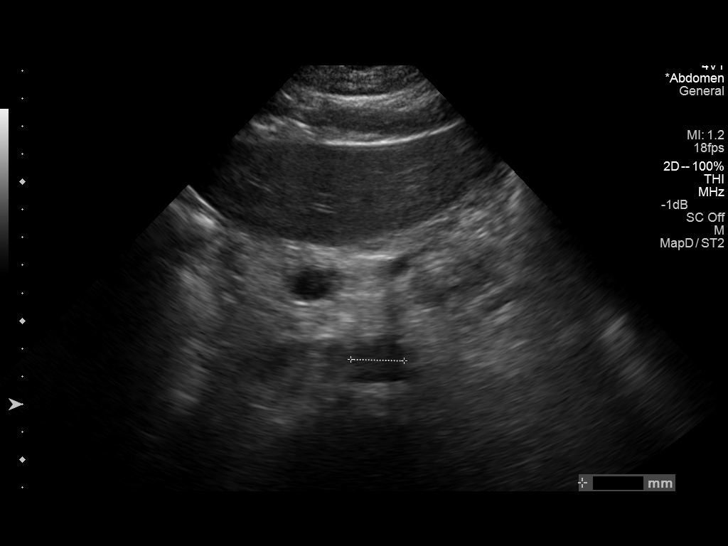
[im 3/11]
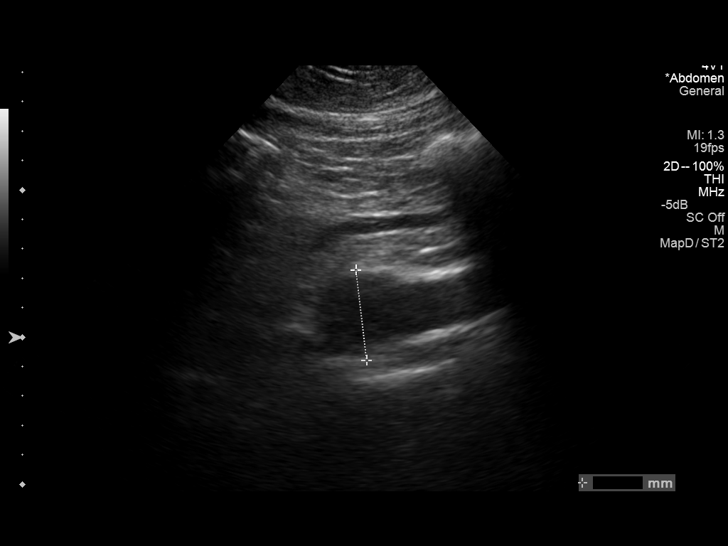
[im 4/11]
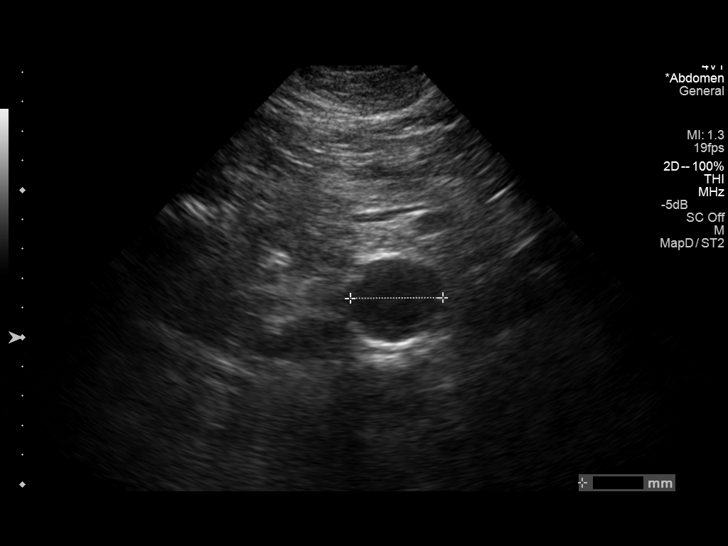
[im 5/11]
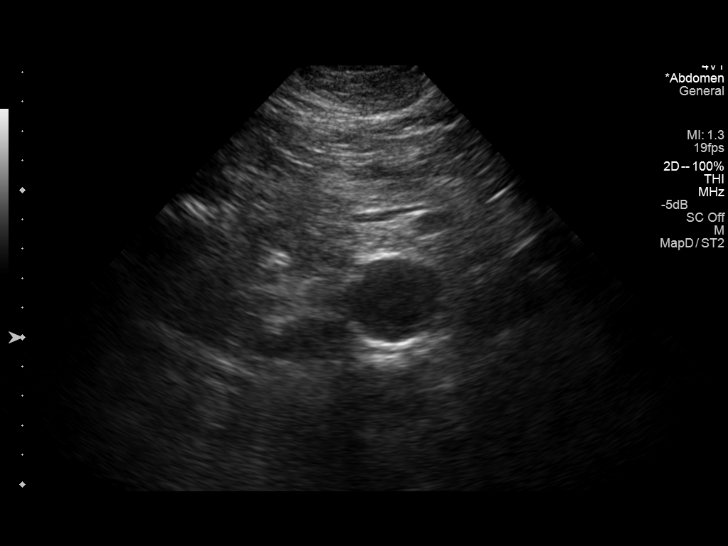
[im 6/11]
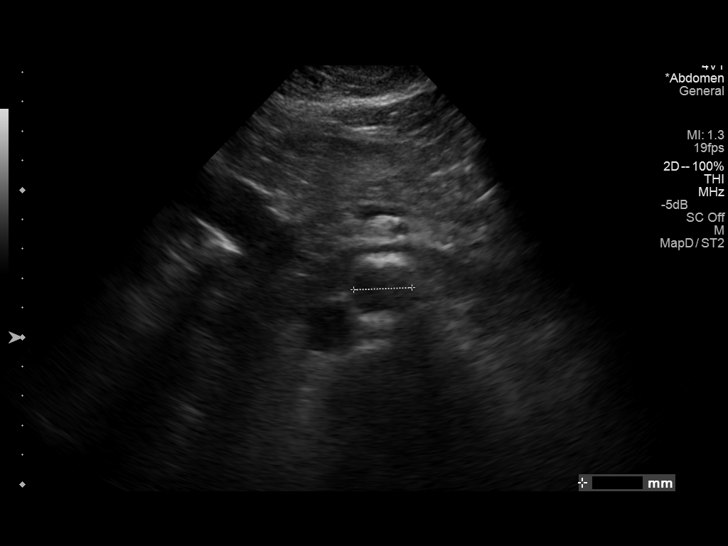
[im 7/11]
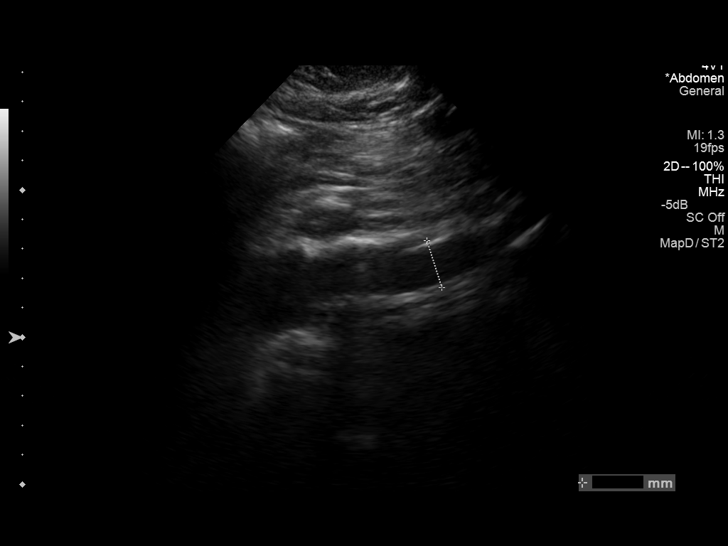
[im 8/11]
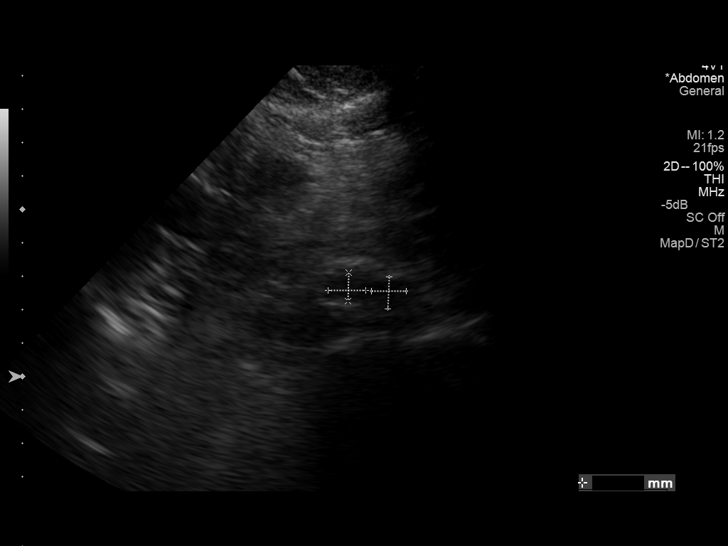
[im 9/11]
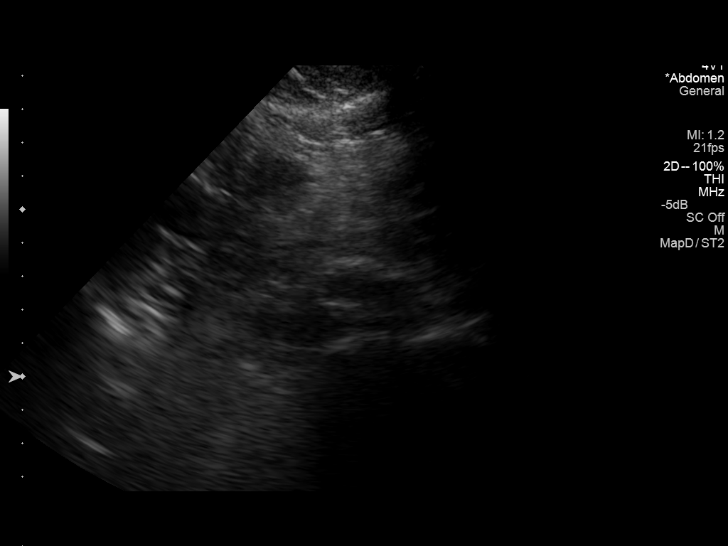
[im 10/11]
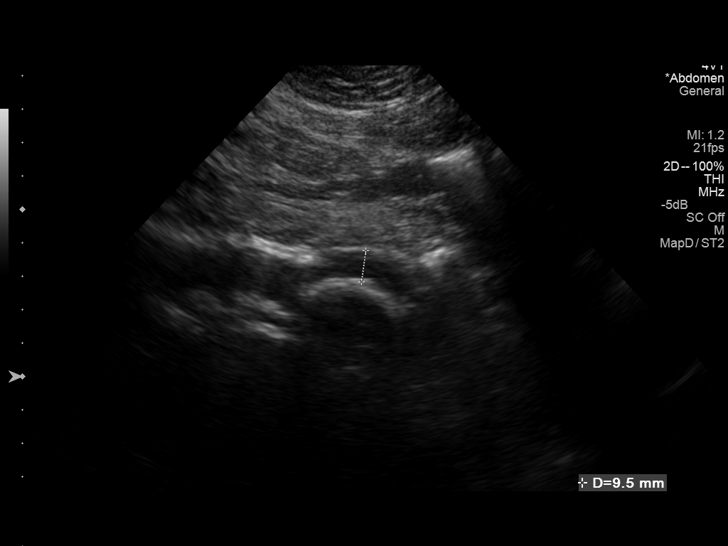
[im 11/11]
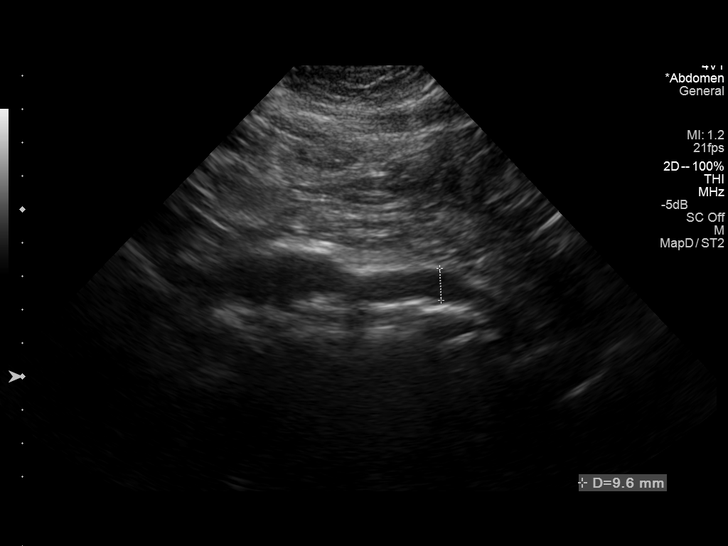

[11 of 11 positions shown; findings below may reference images not displayed]

FINDINGS: Abdominal Aorta

Focal fusiform aneurysmal dilatation of mid abdominal aorta,
approximately 3.3 cm in length. Common iliac arteries normal
caliber.

Maximum AP

Diameter:  3.1 cm

Maximum TRV

Diameter: 3.1 cm
IMPRESSION: Fusiform aneurysmal dilatation of mid abdominal aorta measuring
x 3.1 cm in greatest axial dimensions and extending 3.3 cm in
length.

Aorta measured 2.9 x 3.0 cm in greatest axial dimensions on prior CT

## 2016-12-22 NOTE — Progress Notes (Signed)
Cardiology Office Note   Date:  12/23/2016   ID:  Dylan [name], DOB 01/04/1946, MRN [id number]  PCP:  Gar Ponto, MD  Cardiologist:   Minus Breeding, MD  Referring:  Gar Ponto, MD  Chief Complaint  Patient presents with  . Coronary Artery Disease      History of Present Illness: Dylan [name] is a 71 y.o. male who presents for follow up of CAD.  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  MRI did not show the LAD between the aorta and pulmonary artery.   In 2010 he had a stent to the RCA.  He had a negative echo stress test in 2014.   I was called recently to clear him for possible lithotripsy but haven't seen him in a year and a half.  He was not pleased that we made him come to the office for clearance.  Apparently he he passed the kidney stone is not actually going to need a procedure now. 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  I was able to review this.  This was negative with a hypertensive BP response.  He says he does quite well.  He is very active. The patient denies any new symptoms such as chest discomfort, neck or arm discomfort. There has been no new shortness of breath, PND or orthopnea. There have been no reported palpitations, presyncope or syncope.   Past Medical History:  Diagnosis Date  . Bowel obstruction   . CAD (coronary artery disease) 10/2009    (NQWMI 90% stenosis in the right coronary artery and otherwise nonobstructive disease.) x 1 stent  . Cataract   . History of kidney stones    multiple   . Hyperlipemia   . Hypertension     Past Surgical History:  Procedure Laterality Date  . CARDIAC CATHETERIZATION     '10- Dr. Percival Spanish follows  . KIDNEY STONE SURGERY    . TOTAL KNEE ARTHROPLASTY Left 2012  . TOTAL KNEE ARTHROPLASTY Right 12/28/2014   Procedure: RIGHT TOTAL KNEE ARTHROPLASTY;  Surgeon: Gearlean Alf, MD;  Location: [facility];  Service: Orthopedics;  Laterality: Right;      Current Outpatient Prescriptions  Medication Sig Dispense Refill  . aspirin EC 81 MG tablet Take 81 mg by mouth daily.    Marland Kitchen atorvastatin (LIPITOR) 40 MG tablet Take 40 mg by mouth daily.    Marland Kitchen ibuprofen (ADVIL,MOTRIN) 200 MG tablet Take 200 mg by mouth daily. Pt. Taking 4-6 ibuprofen daily    . losartan (COZAAR) 100 MG tablet Take 100 mg by mouth daily.     . nitroGLYCERIN (NITROSTAT) 0.4 MG SL tablet DISSOLVE ONE TABLET UNDER THE TONGUE EVERY 5 MINUTES AS NEEDED FOR CHEST PAIN.  DO NOT EXCEED A TOTAL OF 3 DOSES IN 15 MINUTES 25 tablet 3  . pantoprazole (PROTONIX) 40 MG tablet Take 40 mg by mouth daily.      No current facility-administered medications for this visit.     Allergies:   Fish allergy; Iodine; and Shrimp [shellfish allergy]    ROS:  Please see the history of present illness.   Otherwise, review of systems are positive for none.   All other systems are reviewed and negative.    PHYSICAL EXAM: VS:  BP (!) 168/80   Pulse 68   Ht 6' (1.829 m)   Wt 202 lb (91.6 kg)   BMI 27.40 kg/m  ,  BMI Body mass index is 27.4 kg/m. GENERAL:  Well appearing HEENT:  Pupils equal round and reactive, fundi not visualized, oral mucosa unremarkable NECK:  No jugular venous distention, waveform within normal limits, carotid upstroke brisk and symmetric, no bruits, no thyromegaly LYMPHATICS:  No cervical, inguinal adenopathy LUNGS:  Clear to auscultation bilaterally BACK:  No CVA tenderness CHEST:  Unremarkable HEART:  PMI not displaced or sustained,S1 and S2 within normal limits, no S3, no S4, no clicks, no rubs, no murmurs ABD:  Flat, positive bowel sounds normal in frequency in pitch, no bruits, no rebound, no guarding, no midline pulsatile mass, no hepatomegaly, no splenomegaly EXT:  2 plus pulses throughout, no edema, no cyanosis no clubbing SKIN:  No rashes no nodules NEURO:  Cranial nerves II through XII grossly intact, motor grossly intact throughout PSYCH:  Cognitively intact,  oriented to person place and time    EKG:  EKG is ordered today. The ekg ordered today demonstrates sinus rhythm, rate 67, axis within normal limits, intervals within normal limits, no acute ST-T wave changes.   Recent Labs: No results found for requested labs within last 8760 hours.       Wt Readings from Last 3 Encounters:  12/23/16 202 lb (91.6 kg)  12/28/14 230 lb (104.3 kg)  12/21/14 230 lb (104.3 kg)      Other studies Reviewed: Additional studies/ records that were reviewed today include: Out patient stress test results. Review of the above records demonstrates:  Please see elsewhere in the note.     ASSESSMENT AND PLAN:  CAD:   The patient has no new sypmtoms.  No further cardiovascular testing is indicated.  We will continue with aggressive risk reduction and meds as listed.  He would be cleared for lithotripsy at this point. He can stop his Plavix as there is no indication for ongoing DAPT.  HTN:  His blood pressure is elevated but he is only been taking half of his Cozaar. He's can restart the whole pill as prescribed.  AAA:  This was 3.1 x 3.1 in 2016.  I will schedule follow-up of this.  HYPERLIPIDEMIA:    This is followed by Gar Ponto, MD     Current medicines are reviewed at length with the patient today.  The patient does not have concerns regarding medicines.  The following changes have been made:  As above  Labs/ tests ordered today include:   Orders Placed This Encounter  Procedures  . EKG 12-Lead     Disposition:   FU with me in 12 months.     Signed, Minus Breeding, MD  12/23/2016 7:21 PM    [facility]

## 2016-12-23 ENCOUNTER — Encounter: Payer: Self-pay | Admitting: Cardiology

## 2016-12-23 ENCOUNTER — Ambulatory Visit (INDEPENDENT_AMBULATORY_CARE_PROVIDER_SITE_OTHER): Payer: Medicare Other | Admitting: Cardiology

## 2016-12-23 VITALS — BP 168/80 | HR 68 | Ht 72.0 in | Wt 202.0 lb

## 2016-12-23 DIAGNOSIS — I714 Abdominal aortic aneurysm, without rupture, unspecified: Secondary | ICD-10-CM

## 2016-12-23 DIAGNOSIS — I10 Essential (primary) hypertension: Secondary | ICD-10-CM

## 2016-12-23 DIAGNOSIS — I25119 Atherosclerotic heart disease of native coronary artery with unspecified angina pectoris: Secondary | ICD-10-CM

## 2016-12-23 NOTE — Patient Instructions (Signed)
Medication Instructions:  Please stop your Plavix. Take Cozaar 100 mg a day. Continue all other medications as listed.  Labwork: None  Testing/Procedures: Your physician has requested that you have an abdominal aorta duplex. During this test, an ultrasound is used to evaluate the aorta. Allow 30 minutes for this exam. Do not eat after midnight the day before and avoid carbonated beverages  Follow-Up: Follow up in 1 year with Dr. Percival Spanish.  You will receive a letter in the mail 2 months before you are due.  Please call us when you receive this letter to schedule your follow up appointment.  If you need a refill on your cardiac medications before your next appointment, please call your pharmacy.  Thank you for choosing [facility]!!

## 2016-12-25 ENCOUNTER — Encounter: Payer: Self-pay | Admitting: *Deleted

## 2016-12-30 ENCOUNTER — Other Ambulatory Visit: Payer: Self-pay | Admitting: Cardiology

## 2016-12-30 DIAGNOSIS — I714 Abdominal aortic aneurysm, without rupture, unspecified: Secondary | ICD-10-CM

## 2017-01-14 ENCOUNTER — Ambulatory Visit: Payer: Medicare Other

## 2017-01-14 DIAGNOSIS — I714 Abdominal aortic aneurysm, without rupture, unspecified: Secondary | ICD-10-CM

## 2017-04-08 DIAGNOSIS — R079 Chest pain, unspecified: Secondary | ICD-10-CM | POA: Diagnosis not present

## 2017-05-26 DIAGNOSIS — I10 Essential (primary) hypertension: Secondary | ICD-10-CM | POA: Diagnosis not present

## 2017-05-26 DIAGNOSIS — Z6823 Body mass index (BMI) 23.0-23.9, adult: Secondary | ICD-10-CM | POA: Diagnosis not present

## 2017-05-26 DIAGNOSIS — E782 Mixed hyperlipidemia: Secondary | ICD-10-CM | POA: Diagnosis not present

## 2017-05-26 DIAGNOSIS — Z87891 Personal history of nicotine dependence: Secondary | ICD-10-CM | POA: Diagnosis not present

## 2017-05-26 DIAGNOSIS — Z0001 Encounter for general adult medical examination with abnormal findings: Secondary | ICD-10-CM | POA: Diagnosis not present

## 2017-05-26 DIAGNOSIS — Z9189 Other specified personal risk factors, not elsewhere classified: Secondary | ICD-10-CM | POA: Diagnosis not present

## 2017-05-26 DIAGNOSIS — R5383 Other fatigue: Secondary | ICD-10-CM | POA: Diagnosis not present

## 2017-06-07 DIAGNOSIS — E782 Mixed hyperlipidemia: Secondary | ICD-10-CM | POA: Diagnosis not present

## 2017-06-07 DIAGNOSIS — I10 Essential (primary) hypertension: Secondary | ICD-10-CM | POA: Diagnosis not present

## 2017-06-07 DIAGNOSIS — Z0001 Encounter for general adult medical examination with abnormal findings: Secondary | ICD-10-CM | POA: Diagnosis not present

## 2017-06-07 DIAGNOSIS — Z1212 Encounter for screening for malignant neoplasm of rectum: Secondary | ICD-10-CM | POA: Diagnosis not present

## 2017-06-07 DIAGNOSIS — M170 Bilateral primary osteoarthritis of knee: Secondary | ICD-10-CM | POA: Diagnosis not present

## 2017-06-14 DIAGNOSIS — N200 Calculus of kidney: Secondary | ICD-10-CM | POA: Diagnosis not present

## 2017-07-09 ENCOUNTER — Ambulatory Visit: Admission: RE | Admit: 2017-07-09 | Discharge: 2017-07-09 | Payer: MEDICARE

## 2017-07-09 DIAGNOSIS — Z888 Allergy status to other drugs, medicaments and biological substances status: Secondary | ICD-10-CM | POA: Diagnosis not present

## 2017-07-09 DIAGNOSIS — K219 Gastro-esophageal reflux disease without esophagitis: Secondary | ICD-10-CM | POA: Diagnosis not present

## 2017-07-09 DIAGNOSIS — Z8249 Family history of ischemic heart disease and other diseases of the circulatory system: Secondary | ICD-10-CM | POA: Diagnosis not present

## 2017-07-09 DIAGNOSIS — E785 Hyperlipidemia, unspecified: Secondary | ICD-10-CM | POA: Diagnosis not present

## 2017-07-09 DIAGNOSIS — Z955 Presence of coronary angioplasty implant and graft: Secondary | ICD-10-CM | POA: Diagnosis not present

## 2017-07-09 DIAGNOSIS — D126 Benign neoplasm of colon, unspecified: Secondary | ICD-10-CM | POA: Diagnosis not present

## 2017-07-09 DIAGNOSIS — I10 Essential (primary) hypertension: Secondary | ICD-10-CM | POA: Diagnosis not present

## 2017-07-09 DIAGNOSIS — M1710 Unilateral primary osteoarthritis, unspecified knee: Secondary | ICD-10-CM | POA: Diagnosis not present

## 2017-07-09 DIAGNOSIS — Z96659 Presence of unspecified artificial knee joint: Secondary | ICD-10-CM | POA: Diagnosis not present

## 2017-07-09 DIAGNOSIS — Z91013 Allergy to seafood: Secondary | ICD-10-CM | POA: Diagnosis not present

## 2017-07-09 DIAGNOSIS — R195 Other fecal abnormalities: Secondary | ICD-10-CM | POA: Diagnosis not present

## 2017-07-09 DIAGNOSIS — I252 Old myocardial infarction: Secondary | ICD-10-CM | POA: Diagnosis not present

## 2017-07-09 DIAGNOSIS — Z883 Allergy status to other anti-infective agents status: Secondary | ICD-10-CM | POA: Diagnosis not present

## 2017-07-09 DIAGNOSIS — Q438 Other specified congenital malformations of intestine: Secondary | ICD-10-CM | POA: Diagnosis not present

## 2017-07-09 DIAGNOSIS — I259 Chronic ischemic heart disease, unspecified: Secondary | ICD-10-CM | POA: Diagnosis not present

## 2017-07-09 DIAGNOSIS — D128 Benign neoplasm of rectum: Secondary | ICD-10-CM | POA: Diagnosis not present

## 2017-07-09 DIAGNOSIS — Z79899 Other long term (current) drug therapy: Secondary | ICD-10-CM | POA: Diagnosis not present

## 2017-07-09 DIAGNOSIS — Z7982 Long term (current) use of aspirin: Secondary | ICD-10-CM | POA: Diagnosis not present

## 2017-07-09 DIAGNOSIS — Z87442 Personal history of urinary calculi: Secondary | ICD-10-CM | POA: Diagnosis not present

## 2017-08-11 DIAGNOSIS — Z23 Encounter for immunization: Secondary | ICD-10-CM | POA: Diagnosis not present

## 2017-08-18 ENCOUNTER — Ambulatory Visit: Admission: RE | Admit: 2017-08-18 | Discharge: 2017-08-18 | Payer: MEDICARE

## 2017-08-18 DIAGNOSIS — D126 Benign neoplasm of colon, unspecified: Principal | ICD-10-CM

## 2017-08-26 DIAGNOSIS — Z6823 Body mass index (BMI) 23.0-23.9, adult: Secondary | ICD-10-CM | POA: Diagnosis not present

## 2017-08-26 DIAGNOSIS — R5383 Other fatigue: Secondary | ICD-10-CM | POA: Diagnosis not present

## 2017-08-26 DIAGNOSIS — I10 Essential (primary) hypertension: Secondary | ICD-10-CM | POA: Diagnosis not present

## 2017-08-30 DIAGNOSIS — M353 Polymyalgia rheumatica: Secondary | ICD-10-CM | POA: Diagnosis not present

## 2017-08-30 DIAGNOSIS — R5383 Other fatigue: Secondary | ICD-10-CM | POA: Diagnosis not present

## 2017-08-30 DIAGNOSIS — Z6823 Body mass index (BMI) 23.0-23.9, adult: Secondary | ICD-10-CM | POA: Diagnosis not present

## 2017-09-02 DIAGNOSIS — Z6823 Body mass index (BMI) 23.0-23.9, adult: Secondary | ICD-10-CM | POA: Diagnosis not present

## 2017-09-02 DIAGNOSIS — D61818 Other pancytopenia: Secondary | ICD-10-CM | POA: Diagnosis not present

## 2017-09-07 ENCOUNTER — Ambulatory Visit
Admission: RE | Admit: 2017-09-07 | Discharge: 2017-09-07 | Disposition: A | Discharge status: Home / Self Care | Payer: MEDICARE | Attending: Hematology & Oncology | Admitting: Hematology & Oncology

## 2017-09-07 ENCOUNTER — Ambulatory Visit
Admission: RE | Admit: 2017-09-07 | Discharge: 2017-09-07 | Disposition: A | Discharge status: Home / Self Care | Payer: MEDICARE

## 2017-09-07 DIAGNOSIS — M1990 Unspecified osteoarthritis, unspecified site: Secondary | ICD-10-CM | POA: Diagnosis not present

## 2017-09-07 DIAGNOSIS — Z87891 Personal history of nicotine dependence: Secondary | ICD-10-CM | POA: Diagnosis not present

## 2017-09-07 DIAGNOSIS — Z7982 Long term (current) use of aspirin: Secondary | ICD-10-CM | POA: Diagnosis not present

## 2017-09-07 DIAGNOSIS — D126 Benign neoplasm of colon, unspecified: Secondary | ICD-10-CM | POA: Diagnosis not present

## 2017-09-07 DIAGNOSIS — R05 Cough: Secondary | ICD-10-CM | POA: Diagnosis not present

## 2017-09-07 DIAGNOSIS — I10 Essential (primary) hypertension: Secondary | ICD-10-CM | POA: Diagnosis not present

## 2017-09-07 DIAGNOSIS — D61818 Other pancytopenia: Secondary | ICD-10-CM | POA: Diagnosis not present

## 2017-09-07 DIAGNOSIS — D709 Neutropenia, unspecified: Secondary | ICD-10-CM | POA: Diagnosis not present

## 2017-09-07 DIAGNOSIS — I2510 Atherosclerotic heart disease of native coronary artery without angina pectoris: Secondary | ICD-10-CM | POA: Diagnosis not present

## 2017-09-07 DIAGNOSIS — Z955 Presence of coronary angioplasty implant and graft: Secondary | ICD-10-CM | POA: Diagnosis not present

## 2017-09-07 DIAGNOSIS — R509 Fever, unspecified: Secondary | ICD-10-CM | POA: Diagnosis not present

## 2017-09-07 DIAGNOSIS — K219 Gastro-esophageal reflux disease without esophagitis: Secondary | ICD-10-CM | POA: Diagnosis not present

## 2017-09-07 DIAGNOSIS — I714 Abdominal aortic aneurysm, without rupture: Secondary | ICD-10-CM | POA: Diagnosis not present

## 2017-09-07 DIAGNOSIS — Z79899 Other long term (current) drug therapy: Secondary | ICD-10-CM | POA: Diagnosis not present

## 2017-09-07 MED ORDER — LEVOFLOXACIN 500 MG TABLET
ORAL_TABLET | Freq: Every day | ORAL | 0 refills | 0 days | Status: CP
Start: 2017-09-07 — End: 2017-09-17

## 2017-09-08 DIAGNOSIS — I10 Essential (primary) hypertension: Secondary | ICD-10-CM | POA: Diagnosis not present

## 2017-09-08 DIAGNOSIS — E785 Hyperlipidemia, unspecified: Secondary | ICD-10-CM | POA: Diagnosis not present

## 2017-09-08 DIAGNOSIS — D709 Neutropenia, unspecified: Secondary | ICD-10-CM | POA: Diagnosis not present

## 2017-09-08 DIAGNOSIS — Z7902 Long term (current) use of antithrombotics/antiplatelets: Secondary | ICD-10-CM | POA: Diagnosis not present

## 2017-09-08 DIAGNOSIS — Z87891 Personal history of nicotine dependence: Secondary | ICD-10-CM | POA: Diagnosis not present

## 2017-09-08 DIAGNOSIS — K5909 Other constipation: Secondary | ICD-10-CM | POA: Diagnosis not present

## 2017-09-08 DIAGNOSIS — M1990 Unspecified osteoarthritis, unspecified site: Secondary | ICD-10-CM | POA: Diagnosis not present

## 2017-09-08 DIAGNOSIS — K219 Gastro-esophageal reflux disease without esophagitis: Secondary | ICD-10-CM | POA: Diagnosis not present

## 2017-09-08 DIAGNOSIS — R509 Fever, unspecified: Secondary | ICD-10-CM | POA: Diagnosis not present

## 2017-09-08 DIAGNOSIS — D703 Neutropenia due to infection: Secondary | ICD-10-CM | POA: Diagnosis not present

## 2017-09-08 DIAGNOSIS — I252 Old myocardial infarction: Secondary | ICD-10-CM | POA: Diagnosis not present

## 2017-09-08 DIAGNOSIS — Z79899 Other long term (current) drug therapy: Secondary | ICD-10-CM | POA: Diagnosis not present

## 2017-09-08 DIAGNOSIS — I2510 Atherosclerotic heart disease of native coronary artery without angina pectoris: Secondary | ICD-10-CM | POA: Diagnosis not present

## 2017-09-08 DIAGNOSIS — Z7982 Long term (current) use of aspirin: Secondary | ICD-10-CM | POA: Diagnosis not present

## 2017-09-08 DIAGNOSIS — R5081 Fever presenting with conditions classified elsewhere: Secondary | ICD-10-CM | POA: Diagnosis not present

## 2017-09-08 DIAGNOSIS — D61818 Other pancytopenia: Secondary | ICD-10-CM | POA: Diagnosis not present

## 2017-09-08 DIAGNOSIS — Z888 Allergy status to other drugs, medicaments and biological substances status: Secondary | ICD-10-CM | POA: Diagnosis not present

## 2017-09-13 DIAGNOSIS — Z87891 Personal history of nicotine dependence: Secondary | ICD-10-CM | POA: Diagnosis not present

## 2017-09-13 DIAGNOSIS — I252 Old myocardial infarction: Secondary | ICD-10-CM | POA: Diagnosis not present

## 2017-09-13 DIAGNOSIS — R3129 Other microscopic hematuria: Secondary | ICD-10-CM | POA: Diagnosis not present

## 2017-09-13 DIAGNOSIS — Z8249 Family history of ischemic heart disease and other diseases of the circulatory system: Secondary | ICD-10-CM | POA: Diagnosis not present

## 2017-09-13 DIAGNOSIS — Z7982 Long term (current) use of aspirin: Secondary | ICD-10-CM | POA: Diagnosis not present

## 2017-09-13 DIAGNOSIS — Z79899 Other long term (current) drug therapy: Secondary | ICD-10-CM | POA: Diagnosis not present

## 2017-09-13 DIAGNOSIS — E7800 Pure hypercholesterolemia, unspecified: Secondary | ICD-10-CM | POA: Diagnosis not present

## 2017-09-13 DIAGNOSIS — R3121 Asymptomatic microscopic hematuria: Secondary | ICD-10-CM | POA: Diagnosis not present

## 2017-09-13 DIAGNOSIS — M1990 Unspecified osteoarthritis, unspecified site: Secondary | ICD-10-CM | POA: Diagnosis not present

## 2017-09-13 DIAGNOSIS — N390 Urinary tract infection, site not specified: Secondary | ICD-10-CM | POA: Diagnosis not present

## 2017-09-13 DIAGNOSIS — R9431 Abnormal electrocardiogram [ECG] [EKG]: Secondary | ICD-10-CM | POA: Diagnosis not present

## 2017-09-13 DIAGNOSIS — R531 Weakness: Secondary | ICD-10-CM | POA: Diagnosis not present

## 2017-09-13 DIAGNOSIS — I10 Essential (primary) hypertension: Secondary | ICD-10-CM | POA: Diagnosis not present

## 2017-09-14 ENCOUNTER — Other Ambulatory Visit (HOSPITAL_COMMUNITY)
Admission: RE | Admit: 2017-09-14 | Discharge: 2017-09-14 | Disposition: A | Discharge status: Home / Self Care | Payer: Medicare Other | Source: Ambulatory Visit | Attending: Family Medicine | Admitting: Family Medicine

## 2017-09-14 DIAGNOSIS — D61818 Other pancytopenia: Secondary | ICD-10-CM | POA: Diagnosis not present

## 2017-09-14 DIAGNOSIS — Z95828 Presence of other vascular implants and grafts: Secondary | ICD-10-CM | POA: Diagnosis not present

## 2017-09-14 DIAGNOSIS — C9200 Acute myeloblastic leukemia, not having achieved remission: Secondary | ICD-10-CM | POA: Insufficient documentation

## 2017-09-14 DIAGNOSIS — I252 Old myocardial infarction: Secondary | ICD-10-CM | POA: Diagnosis not present

## 2017-09-15 DIAGNOSIS — K219 Gastro-esophageal reflux disease without esophagitis: Secondary | ICD-10-CM | POA: Diagnosis not present

## 2017-09-15 DIAGNOSIS — K5900 Constipation, unspecified: Secondary | ICD-10-CM | POA: Diagnosis not present

## 2017-09-15 DIAGNOSIS — I712 Thoracic aortic aneurysm, without rupture: Secondary | ICD-10-CM | POA: Diagnosis not present

## 2017-09-15 DIAGNOSIS — Z8249 Family history of ischemic heart disease and other diseases of the circulatory system: Secondary | ICD-10-CM | POA: Diagnosis not present

## 2017-09-15 DIAGNOSIS — C9200 Acute myeloblastic leukemia, not having achieved remission: Secondary | ICD-10-CM | POA: Diagnosis not present

## 2017-09-15 DIAGNOSIS — D709 Neutropenia, unspecified: Secondary | ICD-10-CM | POA: Diagnosis not present

## 2017-09-15 DIAGNOSIS — Z87891 Personal history of nicotine dependence: Secondary | ICD-10-CM | POA: Diagnosis not present

## 2017-09-15 DIAGNOSIS — Z7902 Long term (current) use of antithrombotics/antiplatelets: Secondary | ICD-10-CM | POA: Diagnosis not present

## 2017-09-15 DIAGNOSIS — Z888 Allergy status to other drugs, medicaments and biological substances status: Secondary | ICD-10-CM | POA: Diagnosis not present

## 2017-09-15 DIAGNOSIS — Z79899 Other long term (current) drug therapy: Secondary | ICD-10-CM | POA: Diagnosis not present

## 2017-09-15 DIAGNOSIS — D61818 Other pancytopenia: Secondary | ICD-10-CM | POA: Diagnosis not present

## 2017-09-15 DIAGNOSIS — E785 Hyperlipidemia, unspecified: Secondary | ICD-10-CM | POA: Diagnosis not present

## 2017-09-15 DIAGNOSIS — I2510 Atherosclerotic heart disease of native coronary artery without angina pectoris: Secondary | ICD-10-CM | POA: Diagnosis not present

## 2017-09-15 DIAGNOSIS — R079 Chest pain, unspecified: Secondary | ICD-10-CM | POA: Diagnosis not present

## 2017-09-15 DIAGNOSIS — R5081 Fever presenting with conditions classified elsewhere: Secondary | ICD-10-CM | POA: Diagnosis not present

## 2017-09-15 DIAGNOSIS — Z7982 Long term (current) use of aspirin: Secondary | ICD-10-CM | POA: Diagnosis not present

## 2017-09-15 DIAGNOSIS — I10 Essential (primary) hypertension: Secondary | ICD-10-CM | POA: Diagnosis not present

## 2017-09-15 DIAGNOSIS — I700 Atherosclerosis of aorta: Secondary | ICD-10-CM | POA: Diagnosis not present

## 2017-09-15 DIAGNOSIS — I252 Old myocardial infarction: Secondary | ICD-10-CM | POA: Diagnosis not present

## 2017-09-18 ENCOUNTER — Ambulatory Visit: Admission: RE | Admit: 2017-09-18 | Discharge: 2017-09-18 | Payer: MEDICARE

## 2017-09-18 MED ORDER — LEVOFLOXACIN 500 MG TABLET
ORAL_TABLET | Freq: Every day | ORAL | 2 refills | 0.00000 days | Status: CP
Start: 2017-09-18 — End: 2017-10-08

## 2017-09-18 MED ORDER — ACYCLOVIR 400 MG TABLET
ORAL_TABLET | Freq: Two times a day (BID) | ORAL | 2 refills | 0 days | Status: CP
Start: 2017-09-18 — End: 2017-10-18

## 2017-09-18 MED ORDER — FLUCONAZOLE 100 MG TABLET
ORAL_TABLET | Freq: Every day | ORAL | 2 refills | 0 days | Status: CP
Start: 2017-09-18 — End: 2017-10-18

## 2017-09-20 ENCOUNTER — Ambulatory Visit
Admission: RE | Admit: 2017-09-20 | Discharge: 2017-09-20 | Disposition: A | Discharge status: Home / Self Care | Payer: MEDICARE

## 2017-09-20 ENCOUNTER — Ambulatory Visit
Admission: RE | Admit: 2017-09-20 | Discharge: 2017-09-20 | Disposition: A | Discharge status: Home / Self Care | Payer: MEDICARE | Attending: Hematology & Oncology | Admitting: Hematology & Oncology

## 2017-09-20 DIAGNOSIS — I10 Essential (primary) hypertension: Secondary | ICD-10-CM | POA: Diagnosis not present

## 2017-09-20 DIAGNOSIS — D126 Benign neoplasm of colon, unspecified: Secondary | ICD-10-CM | POA: Diagnosis not present

## 2017-09-20 DIAGNOSIS — Z5111 Encounter for antineoplastic chemotherapy: Secondary | ICD-10-CM | POA: Diagnosis not present

## 2017-09-20 DIAGNOSIS — C9300 Acute monoblastic/monocytic leukemia, not having achieved remission: Secondary | ICD-10-CM | POA: Diagnosis not present

## 2017-09-20 DIAGNOSIS — Z87891 Personal history of nicotine dependence: Secondary | ICD-10-CM | POA: Diagnosis not present

## 2017-09-20 MED ORDER — PROCHLORPERAZINE MALEATE 10 MG TABLET
ORAL_TABLET | Freq: Four times a day (QID) | ORAL | 0 refills | 0.00000 days | Status: CP | PRN
Start: 2017-09-20 — End: 2017-11-11

## 2017-09-22 ENCOUNTER — Ambulatory Visit
Admission: RE | Admit: 2017-09-22 | Discharge: 2017-09-22 | Disposition: A | Discharge status: Home / Self Care | Payer: MEDICARE

## 2017-09-22 ENCOUNTER — Ambulatory Visit: Admission: RE | Admit: 2017-09-22 | Discharge: 2017-09-22 | Payer: MEDICARE

## 2017-09-22 DIAGNOSIS — C9300 Acute monoblastic/monocytic leukemia, not having achieved remission: Secondary | ICD-10-CM | POA: Diagnosis not present

## 2017-09-22 DIAGNOSIS — Z5111 Encounter for antineoplastic chemotherapy: Secondary | ICD-10-CM | POA: Diagnosis not present

## 2017-09-23 ENCOUNTER — Ambulatory Visit
Admission: RE | Admit: 2017-09-23 | Discharge: 2017-09-23 | Disposition: A | Discharge status: Home / Self Care | Payer: MEDICARE

## 2017-09-23 DIAGNOSIS — C9300 Acute monoblastic/monocytic leukemia, not having achieved remission: Secondary | ICD-10-CM | POA: Diagnosis not present

## 2017-09-23 DIAGNOSIS — Z5111 Encounter for antineoplastic chemotherapy: Secondary | ICD-10-CM | POA: Diagnosis not present

## 2017-09-23 DIAGNOSIS — Z79899 Other long term (current) drug therapy: Secondary | ICD-10-CM | POA: Diagnosis not present

## 2017-09-23 DIAGNOSIS — E860 Dehydration: Secondary | ICD-10-CM

## 2017-09-24 ENCOUNTER — Ambulatory Visit
Admission: RE | Admit: 2017-09-24 | Discharge: 2017-09-24 | Disposition: A | Discharge status: Home / Self Care | Payer: MEDICARE

## 2017-09-24 DIAGNOSIS — C9300 Acute monoblastic/monocytic leukemia, not having achieved remission: Secondary | ICD-10-CM | POA: Diagnosis not present

## 2017-09-27 ENCOUNTER — Ambulatory Visit
Admission: RE | Admit: 2017-09-27 | Discharge: 2017-09-27 | Disposition: A | Discharge status: Home / Self Care | Payer: MEDICARE

## 2017-09-27 ENCOUNTER — Ambulatory Visit
Admission: RE | Admit: 2017-09-27 | Discharge: 2017-09-27 | Disposition: A | Discharge status: Home / Self Care | Payer: MEDICARE | Attending: Hematology & Oncology | Admitting: Hematology & Oncology

## 2017-09-27 DIAGNOSIS — R05 Cough: Secondary | ICD-10-CM | POA: Diagnosis not present

## 2017-09-27 DIAGNOSIS — Z8249 Family history of ischemic heart disease and other diseases of the circulatory system: Secondary | ICD-10-CM | POA: Diagnosis not present

## 2017-09-27 DIAGNOSIS — M19041 Primary osteoarthritis, right hand: Secondary | ICD-10-CM | POA: Diagnosis not present

## 2017-09-27 DIAGNOSIS — Z87891 Personal history of nicotine dependence: Secondary | ICD-10-CM | POA: Diagnosis not present

## 2017-09-27 DIAGNOSIS — C9300 Acute monoblastic/monocytic leukemia, not having achieved remission: Secondary | ICD-10-CM | POA: Diagnosis not present

## 2017-09-27 DIAGNOSIS — K219 Gastro-esophageal reflux disease without esophagitis: Secondary | ICD-10-CM | POA: Diagnosis not present

## 2017-09-27 DIAGNOSIS — Z7902 Long term (current) use of antithrombotics/antiplatelets: Secondary | ICD-10-CM | POA: Diagnosis not present

## 2017-09-27 DIAGNOSIS — Z79899 Other long term (current) drug therapy: Secondary | ICD-10-CM | POA: Diagnosis not present

## 2017-09-27 DIAGNOSIS — E785 Hyperlipidemia, unspecified: Secondary | ICD-10-CM | POA: Diagnosis not present

## 2017-09-27 DIAGNOSIS — I10 Essential (primary) hypertension: Secondary | ICD-10-CM | POA: Diagnosis not present

## 2017-09-27 DIAGNOSIS — D709 Neutropenia, unspecified: Secondary | ICD-10-CM | POA: Diagnosis not present

## 2017-09-27 DIAGNOSIS — Z5111 Encounter for antineoplastic chemotherapy: Secondary | ICD-10-CM | POA: Diagnosis not present

## 2017-09-27 DIAGNOSIS — Z888 Allergy status to other drugs, medicaments and biological substances status: Secondary | ICD-10-CM | POA: Diagnosis not present

## 2017-09-27 DIAGNOSIS — Z955 Presence of coronary angioplasty implant and graft: Secondary | ICD-10-CM | POA: Diagnosis not present

## 2017-09-27 DIAGNOSIS — I252 Old myocardial infarction: Secondary | ICD-10-CM | POA: Diagnosis not present

## 2017-09-27 DIAGNOSIS — M19042 Primary osteoarthritis, left hand: Secondary | ICD-10-CM | POA: Diagnosis not present

## 2017-09-27 DIAGNOSIS — I2510 Atherosclerotic heart disease of native coronary artery without angina pectoris: Secondary | ICD-10-CM | POA: Diagnosis not present

## 2017-09-27 DIAGNOSIS — M1710 Unilateral primary osteoarthritis, unspecified knee: Secondary | ICD-10-CM | POA: Diagnosis not present

## 2017-09-28 ENCOUNTER — Ambulatory Visit
Admission: RE | Admit: 2017-09-28 | Discharge: 2017-09-28 | Disposition: A | Discharge status: Home / Self Care | Payer: MEDICARE

## 2017-09-28 DIAGNOSIS — C9300 Acute monoblastic/monocytic leukemia, not having achieved remission: Secondary | ICD-10-CM | POA: Diagnosis not present

## 2017-09-28 DIAGNOSIS — Z79899 Other long term (current) drug therapy: Secondary | ICD-10-CM | POA: Diagnosis not present

## 2017-09-28 DIAGNOSIS — Z5111 Encounter for antineoplastic chemotherapy: Secondary | ICD-10-CM | POA: Diagnosis not present

## 2017-09-29 ENCOUNTER — Ambulatory Visit
Admission: RE | Admit: 2017-09-29 | Discharge: 2017-09-29 | Disposition: A | Discharge status: Home / Self Care | Payer: MEDICARE

## 2017-09-29 DIAGNOSIS — C9300 Acute monoblastic/monocytic leukemia, not having achieved remission: Secondary | ICD-10-CM | POA: Diagnosis not present

## 2017-09-29 DIAGNOSIS — Z5111 Encounter for antineoplastic chemotherapy: Secondary | ICD-10-CM | POA: Diagnosis not present

## 2017-09-30 ENCOUNTER — Encounter (HOSPITAL_COMMUNITY): Payer: Self-pay

## 2017-09-30 ENCOUNTER — Ambulatory Visit
Admission: RE | Admit: 2017-09-30 | Discharge: 2017-09-30 | Disposition: A | Discharge status: Home / Self Care | Payer: MEDICARE

## 2017-09-30 DIAGNOSIS — Z5111 Encounter for antineoplastic chemotherapy: Secondary | ICD-10-CM | POA: Diagnosis not present

## 2017-09-30 DIAGNOSIS — C9300 Acute monoblastic/monocytic leukemia, not having achieved remission: Secondary | ICD-10-CM | POA: Diagnosis not present

## 2017-10-01 ENCOUNTER — Encounter (HOSPITAL_COMMUNITY): Payer: Self-pay

## 2017-10-01 LAB — CHROMOSOME ANALYSIS, BONE MARROW

## 2017-10-01 LAB — TISSUE HYBRIDIZATION (BONE MARROW)-NCBH

## 2017-10-04 ENCOUNTER — Ambulatory Visit
Admission: RE | Admit: 2017-10-04 | Discharge: 2017-10-04 | Disposition: A | Discharge status: Home / Self Care | Payer: MEDICARE

## 2017-10-04 ENCOUNTER — Ambulatory Visit
Admission: RE | Admit: 2017-10-04 | Discharge: 2017-10-04 | Disposition: A | Discharge status: Home / Self Care | Payer: MEDICARE | Attending: Hematology & Oncology | Admitting: Hematology & Oncology

## 2017-10-04 DIAGNOSIS — I959 Hypotension, unspecified: Secondary | ICD-10-CM | POA: Diagnosis not present

## 2017-10-04 DIAGNOSIS — N200 Calculus of kidney: Secondary | ICD-10-CM | POA: Diagnosis not present

## 2017-10-04 DIAGNOSIS — Z8249 Family history of ischemic heart disease and other diseases of the circulatory system: Secondary | ICD-10-CM | POA: Diagnosis not present

## 2017-10-04 DIAGNOSIS — E785 Hyperlipidemia, unspecified: Secondary | ICD-10-CM | POA: Diagnosis not present

## 2017-10-04 DIAGNOSIS — Z87891 Personal history of nicotine dependence: Secondary | ICD-10-CM | POA: Diagnosis not present

## 2017-10-04 DIAGNOSIS — Z452 Encounter for adjustment and management of vascular access device: Secondary | ICD-10-CM | POA: Diagnosis not present

## 2017-10-04 DIAGNOSIS — C9300 Acute monoblastic/monocytic leukemia, not having achieved remission: Secondary | ICD-10-CM | POA: Diagnosis not present

## 2017-10-04 DIAGNOSIS — Z87898 Personal history of other specified conditions: Secondary | ICD-10-CM | POA: Diagnosis not present

## 2017-10-04 DIAGNOSIS — E860 Dehydration: Secondary | ICD-10-CM | POA: Diagnosis not present

## 2017-10-04 DIAGNOSIS — I252 Old myocardial infarction: Secondary | ICD-10-CM | POA: Diagnosis not present

## 2017-10-04 DIAGNOSIS — K635 Polyp of colon: Secondary | ICD-10-CM | POA: Diagnosis not present

## 2017-10-04 DIAGNOSIS — D61818 Other pancytopenia: Secondary | ICD-10-CM | POA: Diagnosis not present

## 2017-10-04 DIAGNOSIS — Z955 Presence of coronary angioplasty implant and graft: Secondary | ICD-10-CM | POA: Diagnosis not present

## 2017-10-04 DIAGNOSIS — Z881 Allergy status to other antibiotic agents status: Secondary | ICD-10-CM | POA: Diagnosis not present

## 2017-10-04 DIAGNOSIS — Z7982 Long term (current) use of aspirin: Secondary | ICD-10-CM | POA: Diagnosis not present

## 2017-10-04 DIAGNOSIS — I2510 Atherosclerotic heart disease of native coronary artery without angina pectoris: Secondary | ICD-10-CM | POA: Diagnosis not present

## 2017-10-04 DIAGNOSIS — C9200 Acute myeloblastic leukemia, not having achieved remission: Secondary | ICD-10-CM | POA: Diagnosis not present

## 2017-10-04 DIAGNOSIS — Z91013 Allergy to seafood: Secondary | ICD-10-CM | POA: Diagnosis not present

## 2017-10-04 DIAGNOSIS — I714 Abdominal aortic aneurysm, without rupture: Secondary | ICD-10-CM | POA: Diagnosis not present

## 2017-10-04 DIAGNOSIS — I951 Orthostatic hypotension: Secondary | ICD-10-CM | POA: Diagnosis not present

## 2017-10-04 DIAGNOSIS — Z791 Long term (current) use of non-steroidal anti-inflammatories (NSAID): Secondary | ICD-10-CM | POA: Diagnosis not present

## 2017-10-04 DIAGNOSIS — Z808 Family history of malignant neoplasm of other organs or systems: Secondary | ICD-10-CM | POA: Diagnosis not present

## 2017-10-04 DIAGNOSIS — K219 Gastro-esophageal reflux disease without esophagitis: Secondary | ICD-10-CM | POA: Diagnosis not present

## 2017-10-04 DIAGNOSIS — I10 Essential (primary) hypertension: Secondary | ICD-10-CM | POA: Diagnosis not present

## 2017-10-04 DIAGNOSIS — M1990 Unspecified osteoarthritis, unspecified site: Secondary | ICD-10-CM | POA: Diagnosis not present

## 2017-10-04 DIAGNOSIS — R0609 Other forms of dyspnea: Secondary | ICD-10-CM

## 2017-10-05 DIAGNOSIS — C9200 Acute myeloblastic leukemia, not having achieved remission: Secondary | ICD-10-CM | POA: Diagnosis not present

## 2017-10-05 DIAGNOSIS — D61818 Other pancytopenia: Secondary | ICD-10-CM | POA: Diagnosis not present

## 2017-10-06 DIAGNOSIS — D61818 Other pancytopenia: Secondary | ICD-10-CM | POA: Diagnosis not present

## 2017-10-06 DIAGNOSIS — C9200 Acute myeloblastic leukemia, not having achieved remission: Secondary | ICD-10-CM | POA: Diagnosis not present

## 2017-10-12 ENCOUNTER — Ambulatory Visit
Admission: RE | Admit: 2017-10-12 | Discharge: 2017-10-12 | Disposition: A | Discharge status: Home / Self Care | Payer: MEDICARE

## 2017-10-12 ENCOUNTER — Ambulatory Visit
Admission: RE | Admit: 2017-10-12 | Discharge: 2017-10-12 | Disposition: A | Discharge status: Home / Self Care | Payer: MEDICARE | Attending: Hematology & Oncology | Admitting: Hematology & Oncology

## 2017-10-12 DIAGNOSIS — H918X3 Other specified hearing loss, bilateral: Secondary | ICD-10-CM | POA: Diagnosis not present

## 2017-10-12 DIAGNOSIS — D61818 Other pancytopenia: Secondary | ICD-10-CM | POA: Diagnosis not present

## 2017-10-12 DIAGNOSIS — M19041 Primary osteoarthritis, right hand: Secondary | ICD-10-CM | POA: Diagnosis not present

## 2017-10-12 DIAGNOSIS — K219 Gastro-esophageal reflux disease without esophagitis: Secondary | ICD-10-CM | POA: Diagnosis not present

## 2017-10-12 DIAGNOSIS — Z881 Allergy status to other antibiotic agents status: Secondary | ICD-10-CM | POA: Diagnosis not present

## 2017-10-12 DIAGNOSIS — M79646 Pain in unspecified finger(s): Secondary | ICD-10-CM | POA: Diagnosis not present

## 2017-10-12 DIAGNOSIS — I2510 Atherosclerotic heart disease of native coronary artery without angina pectoris: Secondary | ICD-10-CM | POA: Diagnosis not present

## 2017-10-12 DIAGNOSIS — Z9221 Personal history of antineoplastic chemotherapy: Secondary | ICD-10-CM | POA: Diagnosis not present

## 2017-10-12 DIAGNOSIS — Z888 Allergy status to other drugs, medicaments and biological substances status: Secondary | ICD-10-CM | POA: Diagnosis not present

## 2017-10-12 DIAGNOSIS — Z5111 Encounter for antineoplastic chemotherapy: Secondary | ICD-10-CM | POA: Diagnosis not present

## 2017-10-12 DIAGNOSIS — Z792 Long term (current) use of antibiotics: Secondary | ICD-10-CM | POA: Diagnosis not present

## 2017-10-12 DIAGNOSIS — C9300 Acute monoblastic/monocytic leukemia, not having achieved remission: Secondary | ICD-10-CM | POA: Diagnosis not present

## 2017-10-12 DIAGNOSIS — Z87891 Personal history of nicotine dependence: Secondary | ICD-10-CM | POA: Diagnosis not present

## 2017-10-12 DIAGNOSIS — E785 Hyperlipidemia, unspecified: Secondary | ICD-10-CM | POA: Diagnosis not present

## 2017-10-12 DIAGNOSIS — C9250 Acute myelomonocytic leukemia, not having achieved remission: Secondary | ICD-10-CM | POA: Diagnosis not present

## 2017-10-12 DIAGNOSIS — Z955 Presence of coronary angioplasty implant and graft: Secondary | ICD-10-CM | POA: Diagnosis not present

## 2017-10-12 DIAGNOSIS — I10 Essential (primary) hypertension: Secondary | ICD-10-CM | POA: Diagnosis not present

## 2017-10-12 DIAGNOSIS — M19042 Primary osteoarthritis, left hand: Secondary | ICD-10-CM | POA: Diagnosis not present

## 2017-10-12 DIAGNOSIS — N200 Calculus of kidney: Secondary | ICD-10-CM | POA: Diagnosis not present

## 2017-10-12 DIAGNOSIS — I714 Abdominal aortic aneurysm, without rupture: Secondary | ICD-10-CM | POA: Diagnosis not present

## 2017-10-12 DIAGNOSIS — D126 Benign neoplasm of colon, unspecified: Secondary | ICD-10-CM | POA: Diagnosis not present

## 2017-10-12 DIAGNOSIS — M1710 Unilateral primary osteoarthritis, unspecified knee: Secondary | ICD-10-CM | POA: Diagnosis not present

## 2017-10-12 DIAGNOSIS — I252 Old myocardial infarction: Secondary | ICD-10-CM | POA: Diagnosis not present

## 2017-10-12 DIAGNOSIS — Z79899 Other long term (current) drug therapy: Secondary | ICD-10-CM | POA: Diagnosis not present

## 2017-10-12 MED ORDER — LEVOFLOXACIN 500 MG TABLET
ORAL_TABLET | Freq: Every day | ORAL | 0 refills | 0.00000 days | Status: CP
Start: 2017-10-12 — End: 2017-10-22

## 2017-10-18 ENCOUNTER — Ambulatory Visit
Admission: RE | Admit: 2017-10-18 | Discharge: 2017-10-18 | Disposition: A | Discharge status: Home / Self Care | Payer: MEDICARE | Attending: Hematology & Oncology | Admitting: Hematology & Oncology

## 2017-10-18 ENCOUNTER — Ambulatory Visit
Admission: RE | Admit: 2017-10-18 | Discharge: 2017-10-18 | Disposition: A | Discharge status: Home / Self Care | Payer: MEDICARE

## 2017-10-18 DIAGNOSIS — I739 Peripheral vascular disease, unspecified: Secondary | ICD-10-CM | POA: Diagnosis not present

## 2017-10-18 DIAGNOSIS — Z6821 Body mass index (BMI) 21.0-21.9, adult: Secondary | ICD-10-CM | POA: Diagnosis not present

## 2017-10-18 DIAGNOSIS — I2510 Atherosclerotic heart disease of native coronary artery without angina pectoris: Secondary | ICD-10-CM | POA: Diagnosis not present

## 2017-10-18 DIAGNOSIS — E785 Hyperlipidemia, unspecified: Secondary | ICD-10-CM | POA: Diagnosis not present

## 2017-10-18 DIAGNOSIS — M25579 Pain in unspecified ankle and joints of unspecified foot: Secondary | ICD-10-CM | POA: Diagnosis not present

## 2017-10-18 DIAGNOSIS — K219 Gastro-esophageal reflux disease without esophagitis: Secondary | ICD-10-CM | POA: Diagnosis not present

## 2017-10-18 DIAGNOSIS — Z87891 Personal history of nicotine dependence: Secondary | ICD-10-CM | POA: Diagnosis not present

## 2017-10-18 DIAGNOSIS — C9300 Acute monoblastic/monocytic leukemia, not having achieved remission: Secondary | ICD-10-CM | POA: Diagnosis not present

## 2017-10-18 DIAGNOSIS — I712 Thoracic aortic aneurysm, without rupture: Secondary | ICD-10-CM | POA: Diagnosis not present

## 2017-10-18 DIAGNOSIS — C9200 Acute myeloblastic leukemia, not having achieved remission: Secondary | ICD-10-CM | POA: Diagnosis not present

## 2017-10-18 DIAGNOSIS — I252 Old myocardial infarction: Secondary | ICD-10-CM | POA: Diagnosis not present

## 2017-10-18 DIAGNOSIS — I10 Essential (primary) hypertension: Secondary | ICD-10-CM | POA: Diagnosis not present

## 2017-10-18 DIAGNOSIS — Z955 Presence of coronary angioplasty implant and graft: Secondary | ICD-10-CM | POA: Diagnosis not present

## 2017-10-18 DIAGNOSIS — M79671 Pain in right foot: Secondary | ICD-10-CM | POA: Diagnosis not present

## 2017-10-18 DIAGNOSIS — L600 Ingrowing nail: Secondary | ICD-10-CM | POA: Diagnosis not present

## 2017-10-18 DIAGNOSIS — M1990 Unspecified osteoarthritis, unspecified site: Secondary | ICD-10-CM | POA: Diagnosis not present

## 2017-10-18 DIAGNOSIS — R110 Nausea: Secondary | ICD-10-CM | POA: Diagnosis not present

## 2017-10-18 MED ORDER — ONDANSETRON HCL 8 MG TABLET
ORAL_TABLET | Freq: Two times a day (BID) | ORAL | 0 refills | 0 days | Status: CP | PRN
Start: 2017-10-18 — End: 2018-10-18

## 2017-10-18 MED ORDER — RANITIDINE 150 MG TABLET
ORAL_TABLET | Freq: Two times a day (BID) | ORAL | 3 refills | 0 days | Status: CP
Start: 2017-10-18 — End: 2017-11-25

## 2017-10-22 ENCOUNTER — Ambulatory Visit
Admission: RE | Admit: 2017-10-22 | Discharge: 2017-10-22 | Disposition: A | Discharge status: Home / Self Care | Payer: MEDICARE

## 2017-10-22 DIAGNOSIS — C9300 Acute monoblastic/monocytic leukemia, not having achieved remission: Secondary | ICD-10-CM | POA: Diagnosis not present

## 2017-10-22 DIAGNOSIS — D126 Benign neoplasm of colon, unspecified: Secondary | ICD-10-CM | POA: Diagnosis not present

## 2017-10-24 ENCOUNTER — Ambulatory Visit: Admission: RE | Admit: 2017-10-24 | Discharge: 2017-10-24 | Payer: MEDICARE

## 2017-10-24 DIAGNOSIS — R05 Cough: Secondary | ICD-10-CM | POA: Diagnosis not present

## 2017-10-24 DIAGNOSIS — E785 Hyperlipidemia, unspecified: Secondary | ICD-10-CM | POA: Diagnosis not present

## 2017-10-24 DIAGNOSIS — R509 Fever, unspecified: Secondary | ICD-10-CM | POA: Diagnosis not present

## 2017-10-24 DIAGNOSIS — C9250 Acute myelomonocytic leukemia, not having achieved remission: Secondary | ICD-10-CM | POA: Diagnosis not present

## 2017-10-24 DIAGNOSIS — E7800 Pure hypercholesterolemia, unspecified: Secondary | ICD-10-CM | POA: Diagnosis not present

## 2017-10-24 DIAGNOSIS — Z888 Allergy status to other drugs, medicaments and biological substances status: Secondary | ICD-10-CM | POA: Diagnosis not present

## 2017-10-24 DIAGNOSIS — R5081 Fever presenting with conditions classified elsewhere: Secondary | ICD-10-CM | POA: Diagnosis not present

## 2017-10-24 DIAGNOSIS — Z8249 Family history of ischemic heart disease and other diseases of the circulatory system: Secondary | ICD-10-CM | POA: Diagnosis not present

## 2017-10-24 DIAGNOSIS — Z87891 Personal history of nicotine dependence: Secondary | ICD-10-CM | POA: Diagnosis not present

## 2017-10-24 DIAGNOSIS — D61818 Other pancytopenia: Secondary | ICD-10-CM | POA: Diagnosis not present

## 2017-10-24 DIAGNOSIS — I2510 Atherosclerotic heart disease of native coronary artery without angina pectoris: Secondary | ICD-10-CM | POA: Diagnosis not present

## 2017-10-24 DIAGNOSIS — I712 Thoracic aortic aneurysm, without rupture: Secondary | ICD-10-CM | POA: Diagnosis not present

## 2017-10-24 DIAGNOSIS — D701 Agranulocytosis secondary to cancer chemotherapy: Secondary | ICD-10-CM | POA: Diagnosis not present

## 2017-10-24 DIAGNOSIS — C9200 Acute myeloblastic leukemia, not having achieved remission: Secondary | ICD-10-CM | POA: Diagnosis not present

## 2017-10-24 DIAGNOSIS — M1990 Unspecified osteoarthritis, unspecified site: Secondary | ICD-10-CM | POA: Diagnosis not present

## 2017-10-24 DIAGNOSIS — I252 Old myocardial infarction: Secondary | ICD-10-CM | POA: Diagnosis not present

## 2017-10-24 DIAGNOSIS — Z881 Allergy status to other antibiotic agents status: Secondary | ICD-10-CM | POA: Diagnosis not present

## 2017-10-24 DIAGNOSIS — K5909 Other constipation: Secondary | ICD-10-CM | POA: Diagnosis not present

## 2017-10-24 DIAGNOSIS — T451X5A Adverse effect of antineoplastic and immunosuppressive drugs, initial encounter: Secondary | ICD-10-CM | POA: Diagnosis not present

## 2017-10-24 DIAGNOSIS — I10 Essential (primary) hypertension: Secondary | ICD-10-CM | POA: Diagnosis not present

## 2017-10-24 DIAGNOSIS — D709 Neutropenia, unspecified: Secondary | ICD-10-CM | POA: Diagnosis not present

## 2017-10-24 DIAGNOSIS — I700 Atherosclerosis of aorta: Secondary | ICD-10-CM | POA: Diagnosis not present

## 2017-10-24 DIAGNOSIS — Z79899 Other long term (current) drug therapy: Secondary | ICD-10-CM | POA: Diagnosis not present

## 2017-10-24 DIAGNOSIS — D6481 Anemia due to antineoplastic chemotherapy: Secondary | ICD-10-CM | POA: Diagnosis not present

## 2017-10-24 DIAGNOSIS — Z806 Family history of leukemia: Secondary | ICD-10-CM | POA: Diagnosis not present

## 2017-10-24 DIAGNOSIS — K219 Gastro-esophageal reflux disease without esophagitis: Secondary | ICD-10-CM | POA: Diagnosis not present

## 2017-10-24 DIAGNOSIS — D6959 Other secondary thrombocytopenia: Secondary | ICD-10-CM | POA: Diagnosis not present

## 2017-11-01 ENCOUNTER — Ambulatory Visit
Admission: RE | Admit: 2017-11-01 | Discharge: 2017-11-01 | Disposition: A | Discharge status: Home / Self Care | Payer: MEDICARE

## 2017-11-01 ENCOUNTER — Ambulatory Visit
Admission: RE | Admit: 2017-11-01 | Discharge: 2017-11-01 | Disposition: A | Discharge status: Home / Self Care | Payer: MEDICARE | Attending: Hematology & Oncology | Admitting: Hematology & Oncology

## 2017-11-01 DIAGNOSIS — E860 Dehydration: Secondary | ICD-10-CM | POA: Diagnosis not present

## 2017-11-01 DIAGNOSIS — R633 Feeding difficulties: Secondary | ICD-10-CM | POA: Diagnosis not present

## 2017-11-01 DIAGNOSIS — C9200 Acute myeloblastic leukemia, not having achieved remission: Secondary | ICD-10-CM | POA: Diagnosis not present

## 2017-11-01 DIAGNOSIS — R0602 Shortness of breath: Secondary | ICD-10-CM | POA: Diagnosis not present

## 2017-11-01 DIAGNOSIS — R091 Pleurisy: Secondary | ICD-10-CM | POA: Diagnosis not present

## 2017-11-01 DIAGNOSIS — Z86010 Personal history of colonic polyps: Secondary | ICD-10-CM | POA: Diagnosis not present

## 2017-11-01 DIAGNOSIS — C9300 Acute monoblastic/monocytic leukemia, not having achieved remission: Secondary | ICD-10-CM | POA: Diagnosis not present

## 2017-11-01 DIAGNOSIS — D6189 Other specified aplastic anemias and other bone marrow failure syndromes: Secondary | ICD-10-CM | POA: Diagnosis not present

## 2017-11-01 DIAGNOSIS — Z87891 Personal history of nicotine dependence: Secondary | ICD-10-CM | POA: Diagnosis not present

## 2017-11-01 DIAGNOSIS — R740 Nonspecific elevation of levels of transaminase and lactic acid dehydrogenase [LDH]: Secondary | ICD-10-CM | POA: Diagnosis not present

## 2017-11-01 DIAGNOSIS — K219 Gastro-esophageal reflux disease without esophagitis: Secondary | ICD-10-CM | POA: Diagnosis not present

## 2017-11-01 DIAGNOSIS — Z87442 Personal history of urinary calculi: Secondary | ICD-10-CM | POA: Diagnosis not present

## 2017-11-01 DIAGNOSIS — M19041 Primary osteoarthritis, right hand: Secondary | ICD-10-CM | POA: Diagnosis not present

## 2017-11-01 DIAGNOSIS — R05 Cough: Secondary | ICD-10-CM | POA: Diagnosis not present

## 2017-11-01 DIAGNOSIS — I10 Essential (primary) hypertension: Secondary | ICD-10-CM | POA: Diagnosis not present

## 2017-11-01 DIAGNOSIS — M19042 Primary osteoarthritis, left hand: Secondary | ICD-10-CM | POA: Diagnosis not present

## 2017-11-01 DIAGNOSIS — R0781 Pleurodynia: Secondary | ICD-10-CM | POA: Diagnosis not present

## 2017-11-01 DIAGNOSIS — Z955 Presence of coronary angioplasty implant and graft: Secondary | ICD-10-CM | POA: Diagnosis not present

## 2017-11-01 DIAGNOSIS — R5081 Fever presenting with conditions classified elsewhere: Secondary | ICD-10-CM | POA: Diagnosis not present

## 2017-11-01 DIAGNOSIS — D61818 Other pancytopenia: Secondary | ICD-10-CM | POA: Diagnosis not present

## 2017-11-01 DIAGNOSIS — I252 Old myocardial infarction: Secondary | ICD-10-CM | POA: Diagnosis not present

## 2017-11-01 DIAGNOSIS — I2510 Atherosclerotic heart disease of native coronary artery without angina pectoris: Secondary | ICD-10-CM | POA: Diagnosis not present

## 2017-11-01 DIAGNOSIS — E876 Hypokalemia: Secondary | ICD-10-CM | POA: Diagnosis not present

## 2017-11-01 DIAGNOSIS — E785 Hyperlipidemia, unspecified: Secondary | ICD-10-CM | POA: Diagnosis not present

## 2017-11-01 DIAGNOSIS — Z5111 Encounter for antineoplastic chemotherapy: Secondary | ICD-10-CM

## 2017-11-01 MED ORDER — POTASSIUM CHLORIDE ER 20 MEQ TABLET,EXTENDED RELEASE(PART/CRYST)
ORAL_TABLET | 0 refills | 0 days | Status: CP
Start: 2017-11-01 — End: 2017-11-05

## 2017-11-02 ENCOUNTER — Ambulatory Visit
Admission: RE | Admit: 2017-11-02 | Discharge: 2017-11-02 | Disposition: A | Discharge status: Home / Self Care | Payer: MEDICARE

## 2017-11-02 DIAGNOSIS — R0781 Pleurodynia: Secondary | ICD-10-CM | POA: Diagnosis not present

## 2017-11-02 DIAGNOSIS — E876 Hypokalemia: Secondary | ICD-10-CM | POA: Diagnosis not present

## 2017-11-02 DIAGNOSIS — C9300 Acute monoblastic/monocytic leukemia, not having achieved remission: Secondary | ICD-10-CM | POA: Diagnosis not present

## 2017-11-02 DIAGNOSIS — I700 Atherosclerosis of aorta: Secondary | ICD-10-CM | POA: Diagnosis not present

## 2017-11-02 DIAGNOSIS — J439 Emphysema, unspecified: Secondary | ICD-10-CM | POA: Diagnosis not present

## 2017-11-02 DIAGNOSIS — J90 Pleural effusion, not elsewhere classified: Secondary | ICD-10-CM | POA: Diagnosis not present

## 2017-11-02 DIAGNOSIS — D6189 Other specified aplastic anemias and other bone marrow failure syndromes: Secondary | ICD-10-CM | POA: Diagnosis not present

## 2017-11-02 DIAGNOSIS — I2510 Atherosclerotic heart disease of native coronary artery without angina pectoris: Secondary | ICD-10-CM | POA: Diagnosis not present

## 2017-11-02 DIAGNOSIS — R590 Localized enlarged lymph nodes: Secondary | ICD-10-CM | POA: Diagnosis not present

## 2017-11-03 ENCOUNTER — Ambulatory Visit
Admission: RE | Admit: 2017-11-03 | Discharge: 2017-11-03 | Disposition: A | Discharge status: Home / Self Care | Payer: MEDICARE | Attending: Hematology & Oncology | Admitting: Hematology & Oncology

## 2017-11-03 ENCOUNTER — Ambulatory Visit
Admission: RE | Admit: 2017-11-03 | Discharge: 2017-11-03 | Disposition: A | Discharge status: Home / Self Care | Payer: MEDICARE

## 2017-11-03 DIAGNOSIS — R05 Cough: Secondary | ICD-10-CM | POA: Diagnosis not present

## 2017-11-03 DIAGNOSIS — R5383 Other fatigue: Secondary | ICD-10-CM | POA: Diagnosis not present

## 2017-11-03 DIAGNOSIS — Z87891 Personal history of nicotine dependence: Secondary | ICD-10-CM | POA: Diagnosis not present

## 2017-11-03 DIAGNOSIS — I2510 Atherosclerotic heart disease of native coronary artery without angina pectoris: Secondary | ICD-10-CM | POA: Diagnosis not present

## 2017-11-03 DIAGNOSIS — E785 Hyperlipidemia, unspecified: Secondary | ICD-10-CM | POA: Diagnosis not present

## 2017-11-03 DIAGNOSIS — K219 Gastro-esophageal reflux disease without esophagitis: Secondary | ICD-10-CM | POA: Diagnosis not present

## 2017-11-03 DIAGNOSIS — C9200 Acute myeloblastic leukemia, not having achieved remission: Secondary | ICD-10-CM | POA: Diagnosis not present

## 2017-11-03 DIAGNOSIS — D6189 Other specified aplastic anemias and other bone marrow failure syndromes: Secondary | ICD-10-CM | POA: Diagnosis not present

## 2017-11-03 DIAGNOSIS — R109 Unspecified abdominal pain: Secondary | ICD-10-CM | POA: Diagnosis not present

## 2017-11-03 DIAGNOSIS — I10 Essential (primary) hypertension: Secondary | ICD-10-CM | POA: Diagnosis not present

## 2017-11-03 DIAGNOSIS — M79646 Pain in unspecified finger(s): Secondary | ICD-10-CM | POA: Diagnosis not present

## 2017-11-03 DIAGNOSIS — I252 Old myocardial infarction: Secondary | ICD-10-CM | POA: Diagnosis not present

## 2017-11-03 DIAGNOSIS — C9300 Acute monoblastic/monocytic leukemia, not having achieved remission: Secondary | ICD-10-CM | POA: Diagnosis not present

## 2017-11-03 DIAGNOSIS — Z955 Presence of coronary angioplasty implant and graft: Secondary | ICD-10-CM | POA: Diagnosis not present

## 2017-11-03 DIAGNOSIS — D61818 Other pancytopenia: Secondary | ICD-10-CM | POA: Diagnosis not present

## 2017-11-03 DIAGNOSIS — Z5111 Encounter for antineoplastic chemotherapy: Secondary | ICD-10-CM | POA: Diagnosis not present

## 2017-11-03 DIAGNOSIS — D696 Thrombocytopenia, unspecified: Secondary | ICD-10-CM | POA: Diagnosis not present

## 2017-11-03 MED ORDER — ALLOPURINOL 100 MG TABLET
ORAL_TABLET | Freq: Every day | ORAL | 0 refills | 0 days | Status: SS
Start: 2017-11-03 — End: 2017-11-25

## 2017-11-03 MED ORDER — HYDROXYUREA 500 MG CAPSULE
ORAL_CAPSULE | 0 refills | 0 days | Status: CP
Start: 2017-11-03 — End: 2017-11-25

## 2017-11-04 ENCOUNTER — Ambulatory Visit
Admission: RE | Admit: 2017-11-04 | Discharge: 2017-11-04 | Disposition: A | Discharge status: Home / Self Care | Payer: MEDICARE

## 2017-11-04 DIAGNOSIS — B999 Unspecified infectious disease: Secondary | ICD-10-CM | POA: Diagnosis not present

## 2017-11-04 DIAGNOSIS — R5081 Fever presenting with conditions classified elsewhere: Secondary | ICD-10-CM | POA: Diagnosis not present

## 2017-11-04 DIAGNOSIS — C9300 Acute monoblastic/monocytic leukemia, not having achieved remission: Secondary | ICD-10-CM

## 2017-11-05 DIAGNOSIS — C9200 Acute myeloblastic leukemia, not having achieved remission: Secondary | ICD-10-CM | POA: Diagnosis not present

## 2017-11-05 DIAGNOSIS — Z6821 Body mass index (BMI) 21.0-21.9, adult: Secondary | ICD-10-CM | POA: Diagnosis not present

## 2017-11-11 ENCOUNTER — Inpatient Hospital Stay
Admission: EM | Admit: 2017-11-11 | Discharge: 2017-11-25 | Disposition: A | Discharge status: Skilled Nursing Facility | Payer: MEDICARE | Source: Intra-hospital

## 2017-11-11 ENCOUNTER — Inpatient Hospital Stay
Admission: EM | Admit: 2017-11-11 | Discharge: 2017-11-25 | Disposition: A | Discharge status: Skilled Nursing Facility | Payer: MEDICARE | Source: Intra-hospital | Attending: Hematology & Oncology | Admitting: Hematology & Oncology

## 2017-11-11 ENCOUNTER — Ambulatory Visit
Admit: 2017-11-11 | Discharge: 2017-11-25 | Disposition: A | Discharge status: Skilled Nursing Facility | Payer: PRIVATE HEALTH INSURANCE | Source: Ambulatory Visit

## 2017-11-11 DIAGNOSIS — R1110 Vomiting, unspecified: Secondary | ICD-10-CM | POA: Diagnosis not present

## 2017-11-11 DIAGNOSIS — E785 Hyperlipidemia, unspecified: Secondary | ICD-10-CM | POA: Diagnosis not present

## 2017-11-11 DIAGNOSIS — C9300 Acute monoblastic/monocytic leukemia, not having achieved remission: Secondary | ICD-10-CM | POA: Diagnosis not present

## 2017-11-11 DIAGNOSIS — Z955 Presence of coronary angioplasty implant and graft: Secondary | ICD-10-CM | POA: Diagnosis not present

## 2017-11-11 DIAGNOSIS — E860 Dehydration: Secondary | ICD-10-CM | POA: Diagnosis not present

## 2017-11-11 DIAGNOSIS — E43 Unspecified severe protein-calorie malnutrition: Secondary | ICD-10-CM | POA: Diagnosis not present

## 2017-11-11 DIAGNOSIS — M179 Osteoarthritis of knee, unspecified: Secondary | ICD-10-CM | POA: Diagnosis not present

## 2017-11-11 DIAGNOSIS — R627 Adult failure to thrive: Secondary | ICD-10-CM | POA: Diagnosis not present

## 2017-11-11 DIAGNOSIS — Z96653 Presence of artificial knee joint, bilateral: Secondary | ICD-10-CM | POA: Diagnosis not present

## 2017-11-11 DIAGNOSIS — D709 Neutropenia, unspecified: Secondary | ICD-10-CM | POA: Diagnosis not present

## 2017-11-11 DIAGNOSIS — D61818 Other pancytopenia: Secondary | ICD-10-CM | POA: Diagnosis not present

## 2017-11-11 DIAGNOSIS — J439 Emphysema, unspecified: Secondary | ICD-10-CM | POA: Diagnosis not present

## 2017-11-11 DIAGNOSIS — R5081 Fever presenting with conditions classified elsewhere: Secondary | ICD-10-CM | POA: Diagnosis not present

## 2017-11-11 DIAGNOSIS — R5383 Other fatigue: Secondary | ICD-10-CM | POA: Diagnosis not present

## 2017-11-11 DIAGNOSIS — Z9221 Personal history of antineoplastic chemotherapy: Secondary | ICD-10-CM | POA: Diagnosis not present

## 2017-11-11 DIAGNOSIS — Z881 Allergy status to other antibiotic agents status: Secondary | ICD-10-CM | POA: Diagnosis not present

## 2017-11-11 DIAGNOSIS — I252 Old myocardial infarction: Secondary | ICD-10-CM | POA: Diagnosis not present

## 2017-11-11 DIAGNOSIS — Z5111 Encounter for antineoplastic chemotherapy: Secondary | ICD-10-CM | POA: Diagnosis not present

## 2017-11-11 DIAGNOSIS — E876 Hypokalemia: Secondary | ICD-10-CM | POA: Diagnosis not present

## 2017-11-11 DIAGNOSIS — R509 Fever, unspecified: Secondary | ICD-10-CM | POA: Diagnosis not present

## 2017-11-11 DIAGNOSIS — R531 Weakness: Secondary | ICD-10-CM | POA: Diagnosis not present

## 2017-11-11 DIAGNOSIS — K56601 Complete intestinal obstruction, unspecified as to cause: Secondary | ICD-10-CM | POA: Diagnosis not present

## 2017-11-11 DIAGNOSIS — R293 Abnormal posture: Secondary | ICD-10-CM | POA: Diagnosis not present

## 2017-11-11 DIAGNOSIS — E7849 Other hyperlipidemia: Secondary | ICD-10-CM | POA: Diagnosis not present

## 2017-11-11 DIAGNOSIS — M6281 Muscle weakness (generalized): Secondary | ICD-10-CM | POA: Diagnosis not present

## 2017-11-11 DIAGNOSIS — I10 Essential (primary) hypertension: Secondary | ICD-10-CM | POA: Diagnosis not present

## 2017-11-11 DIAGNOSIS — R278 Other lack of coordination: Secondary | ICD-10-CM | POA: Diagnosis not present

## 2017-11-11 DIAGNOSIS — K219 Gastro-esophageal reflux disease without esophagitis: Secondary | ICD-10-CM | POA: Diagnosis not present

## 2017-11-11 DIAGNOSIS — H61329 Acquired stenosis of external ear canal secondary to inflammation and infection, unspecified ear: Secondary | ICD-10-CM | POA: Diagnosis not present

## 2017-11-11 DIAGNOSIS — I2510 Atherosclerotic heart disease of native coronary artery without angina pectoris: Secondary | ICD-10-CM | POA: Diagnosis not present

## 2017-11-11 DIAGNOSIS — Z87891 Personal history of nicotine dependence: Secondary | ICD-10-CM | POA: Diagnosis not present

## 2017-11-11 DIAGNOSIS — C9210 Chronic myeloid leukemia, BCR/ABL-positive, not having achieved remission: Secondary | ICD-10-CM | POA: Diagnosis not present

## 2017-11-11 DIAGNOSIS — R400 Somnolence: Secondary | ICD-10-CM | POA: Diagnosis not present

## 2017-11-11 DIAGNOSIS — J90 Pleural effusion, not elsewhere classified: Secondary | ICD-10-CM | POA: Diagnosis not present

## 2017-11-11 DIAGNOSIS — Z6821 Body mass index (BMI) 21.0-21.9, adult: Secondary | ICD-10-CM | POA: Diagnosis not present

## 2017-11-11 DIAGNOSIS — R0602 Shortness of breath: Secondary | ICD-10-CM | POA: Diagnosis not present

## 2017-11-11 DIAGNOSIS — I714 Abdominal aortic aneurysm, without rupture: Secondary | ICD-10-CM | POA: Diagnosis not present

## 2017-11-11 DIAGNOSIS — R05 Cough: Secondary | ICD-10-CM | POA: Diagnosis not present

## 2017-11-11 DIAGNOSIS — R1310 Dysphagia, unspecified: Secondary | ICD-10-CM | POA: Diagnosis not present

## 2017-11-11 DIAGNOSIS — C9200 Acute myeloblastic leukemia, not having achieved remission: Secondary | ICD-10-CM | POA: Diagnosis not present

## 2017-11-11 DIAGNOSIS — R918 Other nonspecific abnormal finding of lung field: Secondary | ICD-10-CM | POA: Diagnosis not present

## 2017-11-11 DIAGNOSIS — D6189 Other specified aplastic anemias and other bone marrow failure syndromes: Secondary | ICD-10-CM | POA: Diagnosis not present

## 2017-11-11 DIAGNOSIS — Z79899 Other long term (current) drug therapy: Secondary | ICD-10-CM | POA: Diagnosis not present

## 2017-11-11 DIAGNOSIS — M1710 Unilateral primary osteoarthritis, unspecified knee: Secondary | ICD-10-CM | POA: Diagnosis not present

## 2017-11-11 DIAGNOSIS — K5900 Constipation, unspecified: Secondary | ICD-10-CM | POA: Diagnosis not present

## 2017-11-11 LAB — LACTATE DEHYDROGENASE
Lactate dehydrogenase:CCnc:Pt:Ser/Plas:Qn:: 1532 — ABNORMAL HIGH
Lactate dehydrogenase:CCnc:Pt:Ser/Plas:Qn:: 1829 — ABNORMAL HIGH

## 2017-11-11 LAB — CBC W/ AUTO DIFF
HEMATOCRIT: 25.3 % — ABNORMAL LOW (ref 41.0–53.0)
HEMATOCRIT: 25.1 % — ABNORMAL LOW (ref 41.0–53.0)
HEMOGLOBIN: 8.4 g/dL — ABNORMAL LOW (ref 13.5–17.5)
MEAN CORPUSCULAR HEMOGLOBIN CONC: 34 g/dL (ref 31.0–37.0)
MEAN CORPUSCULAR HEMOGLOBIN CONC: 33.6 g/dL (ref 31.0–37.0)
MEAN CORPUSCULAR HEMOGLOBIN: 30.9 pg (ref 26.0–34.0)
MEAN CORPUSCULAR HEMOGLOBIN: 30.8 pg (ref 26.0–34.0)
MEAN PLATELET VOLUME: 10.6 fL — ABNORMAL HIGH (ref 7.0–10.0)
MEAN PLATELET VOLUME: 11.3 fL — ABNORMAL HIGH (ref 7.0–10.0)
PLATELET COUNT: 163 10*9/L (ref 150–440)
RED BLOOD CELL COUNT: 2.74 10*12/L — ABNORMAL LOW (ref 4.50–5.90)
RED CELL DISTRIBUTION WIDTH: 15.4 % — ABNORMAL HIGH (ref 12.0–15.0)
WBC ADJUSTED: 34.8 10*9/L — ABNORMAL HIGH (ref 4.5–11.0)
WBC ADJUSTED: 28.2 10*9/L — ABNORMAL HIGH (ref 4.5–11.0)

## 2017-11-11 LAB — COMPREHENSIVE METABOLIC PANEL
ALBUMIN: 3.5 g/dL (ref 3.5–5.0)
ALBUMIN: 3.3 g/dL — ABNORMAL LOW (ref 3.5–5.0)
ALKALINE PHOSPHATASE: 261 U/L — ABNORMAL HIGH (ref 38–126)
ALKALINE PHOSPHATASE: 252 U/L — ABNORMAL HIGH (ref 38–126)
ALT (SGPT): 60 U/L (ref 19–72)
ALT (SGPT): 57 U/L (ref 19–72)
ANION GAP: 11 mmol/L (ref 9–15)
ANION GAP: 12 mmol/L (ref 9–15)
AST (SGOT): 69 U/L — ABNORMAL HIGH (ref 19–55)
AST (SGOT): 68 U/L — ABNORMAL HIGH (ref 19–55)
BILIRUBIN TOTAL: 1 mg/dL (ref 0.0–1.2)
BILIRUBIN TOTAL: 0.8 mg/dL (ref 0.0–1.2)
BLOOD UREA NITROGEN: 22 mg/dL — ABNORMAL HIGH (ref 7–21)
BUN / CREAT RATIO: 18
BUN / CREAT RATIO: 22
CALCIUM: 9.2 mg/dL (ref 8.5–10.2)
CALCIUM: 9 mg/dL (ref 8.5–10.2)
CHLORIDE: 98 mmol/L (ref 98–107)
CHLORIDE: 96 mmol/L — ABNORMAL LOW (ref 98–107)
CO2: 28 mmol/L (ref 22.0–30.0)
CO2: 28 mmol/L (ref 22.0–30.0)
CREATININE: 1.07 mg/dL (ref 0.70–1.30)
CREATININE: 1.01 mg/dL (ref 0.70–1.30)
EGFR MDRD AF AMER: >=60 mL/min/{1.73_m2} (ref >=60)
EGFR MDRD AF AMER: >=60 mL/min/{1.73_m2} (ref >=60)
EGFR MDRD NON AF AMER: >=60 mL/min/{1.73_m2} (ref >=60)
EGFR MDRD NON AF AMER: >=60 mL/min/{1.73_m2} (ref >=60)
GLUCOSE RANDOM: 167 mg/dL (ref 65–179)
POTASSIUM: 3.7 mmol/L (ref 3.5–5.0)
POTASSIUM: 4.1 mmol/L (ref 3.5–5.0)
PROTEIN TOTAL: 6.7 g/dL (ref 6.5–8.3)
SODIUM: 137 mmol/L (ref 135–145)
SODIUM: 136 mmol/L (ref 135–145)

## 2017-11-11 LAB — BLASTS - REL (DIFF): Lab: 12

## 2017-11-11 LAB — PROTIME-INR: INR: 1.52

## 2017-11-11 LAB — MANUAL DIFFERENTIAL
BASOPHILS - ABS (DIFF): 0.6 10*9/L — ABNORMAL HIGH (ref 0.0–0.1)
BLASTS - REL (DIFF): 12 % (ref <=0)
BLASTS - REL (DIFF): 10 % (ref <=0)
EOSINOPHILS - ABS (DIFF): 0.3 10*9/L (ref 0.0–0.4)
EOSINOPHILS - ABS (DIFF): 0.6 10*9/L — ABNORMAL HIGH (ref 0.0–0.4)
MONOCYTES - ABS (DIFF): 18.8 10*9/L — ABNORMAL HIGH (ref 0.2–0.8)
NEUTROPHILS - ABS (DIFF): 7.7 10*9/L — ABNORMAL HIGH (ref 2.0–7.5)
NEUTROPHILS - ABS (DIFF): 5.4 10*9/L (ref 2.0–7.5)

## 2017-11-11 LAB — SODIUM: Sodium:SCnc:Pt:Ser/Plas:Qn:: 137

## 2017-11-11 LAB — BASOPHILS - ABS (DIFF): Lab: 0.6 — ABNORMAL HIGH

## 2017-11-11 LAB — FOLATE: Folate:MCnc:Pt:Ser/Plas:Qn:: 7.8

## 2017-11-11 LAB — URIC ACID
Urate:MCnc:Pt:Ser/Plas:Qn:: 4.8
Urate:MCnc:Pt:Ser/Plas:Qn:: 5

## 2017-11-11 LAB — WBC ADJUSTED: Lab: 28.2 — ABNORMAL HIGH

## 2017-11-11 LAB — THYROID STIMULATING HORMONE
Thyrotropin:ACnc:Pt:Ser/Plas:Qn:: 3.578 — ABNORMAL HIGH
Thyrotropin:ACnc:Pt:Ser/Plas:Qn:: 4.223 — ABNORMAL HIGH

## 2017-11-11 LAB — BLOOD GAS, VENOUS
HCO3 VENOUS: 27 mmol/L (ref 22–27)
O2 SATURATION VENOUS: 28.6 % — ABNORMAL LOW (ref 40.0–85.0)
PCO2 VENOUS: 52 mmHg (ref 40–60)

## 2017-11-11 LAB — PHOSPHORUS: Phosphate:MCnc:Pt:Ser/Plas:Qn:: 4.8 — ABNORMAL HIGH

## 2017-11-11 LAB — LACTATE BLOOD VENOUS
Lactate:SCnc:Pt:BldV:Qn:: 1.4
Lactate:SCnc:Pt:BldV:Qn:: 2.3 — ABNORMAL HIGH

## 2017-11-11 LAB — PATHOLOGIST SMEAR INTERPRETATION

## 2017-11-11 LAB — VITAMIN B-12: Cobalamins:MCnc:Pt:Ser/Plas:Qn:: >1000 — ABNORMAL HIGH

## 2017-11-11 LAB — COLLECTION: Lab: 0

## 2017-11-11 LAB — EGFR MDRD AF AMER: Glomerular filtration rate/1.73 sq M.predicted.black:ArVRat:Pt:Ser/Plas/Bld:Qn:Creatinine-based formula (MDRD): >=60

## 2017-11-11 LAB — PROTIME: Lab: 17.3 — ABNORMAL HIGH

## 2017-11-11 LAB — SPECIMEN SOURCE

## 2017-11-11 LAB — TSH: THYROID STIMULATING HORMONE: 3.578 u[IU]/mL — ABNORMAL HIGH (ref 0.600–3.300)

## 2017-11-11 LAB — MEAN CORPUSCULAR VOLUME: Lab: 90.9

## 2017-11-11 MED ORDER — VENETOCLAX 100 MG TABLET
Freq: Every day | ORAL | 6 refills | 0.00000 days | Status: SS
Start: 2017-11-11 — End: 2017-11-11

## 2017-11-11 MED ORDER — VENETOCLAX 100 MG TABLET: each | 6 refills | 0 days

## 2017-11-11 NOTE — Unmapped (Signed)
[facility]  Emergency Department Provider Note  ED Clinical Impression     Final diagnoses:   None       Initial Impression, ED Course, Assessment and Plan     Patient is a 71 y.o. 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    On exam, the patient appears cachectic, elderly, thin, and is in NAD. VS are significant for tachycardia but are otherwise WNL, he is afebrile. LCTAB. Abdomen soft, nontender.     Differential includes infection vs failure to thrive vs progression of disease.     Plan for CXR, EKG, and labs, including lactate and blood cultures. Will give IVF. Anticipate admission.    5:41 PM  Leukocytosis at 28.2 down from earlier today without any significant intervention done. CXR stable without acute airspace disease. Patient accepted for admission to [facility]. Will hold off on abx as no clear source identified. EKG shows some early repolarization but no ischemia.       ____________________________________________    Time seen: November 11, 2017 3:19 PM    I have reviewed the triage vital signs and the nursing notes.  I have discussed the case with the ED Attending, Dr. Lenon Ahmadi.     Labs and radiology results that were available during my care of the patient were independently reviewed by me and considered in my medical decision making.    Portions of this record have been created using Scientist, clinical (histocompatibility and immunogenetics). Dictation errors have been sought, but may not have been identified and corrected.    History     Chief Complaint  Weakness      HPI   Arthur Bates is a 71 y.o. 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 Patient was seen in Hematology Clinic today for generalized weakness, fatigue, decreased appetite, and 3 days of nausea/vomiting. Labs were significant for leukocytosis of 34.8, though notably this is unchanged from 8 days prior. Clinic recommended ED evaluation for likely admission and continuation of care. Currently, wife reports low-grade fevers, decreased PO intake, unintended weight loss, fatigue, malaise, an intermittently productive cough, and 3 days of nausea/vomiting. Patient additionally reports shortness of breath with exertion. He takes daily ASA but is otherwise not anticoagulated. He no longer takes his HTN medication as recommended by his provider due to weight loss. Patient denies any chest pain, diarrhea, dysuria, hematuria, or any other medical concerns at this time.     Past Medical History:   Diagnosis Date   ??? Coronary artery disease    ??? Depression    ??? Dissecting aortic aneurysm (any part), abdominal (CMS-HCC)    ??? ED (erectile dysfunction)    ??? GERD (gastroesophageal reflux disease)    ??? HTN (hypertension)    ??? Hyperlipidemia    ??? Ischemic heart disease    ??? Osteoarthritis of knee        Past Surgical History:   Procedure Laterality Date   ??? CARDIAC CATHETERIZATION     ??? COLONOSCOPY  2009   ??? CORONARY ANGIOPLASTY WITH STENT PLACEMENT     ??? KNEE SURGERY     ???  TOTAL KNEE ARTHROPLASTY            Current Facility-Administered Medications:   ???  sodium chloride 0.9% (NS BOLUS) bolus 1,000 mL, 1,000 mL, Intravenous, Once, Verlin Dike, MD    Current Outpatient Prescriptions:   ???  acyclovir (ZOVIRAX) 400 MG tablet, Take 400 mg by mouth Two (2) times a day., Disp: , Rfl:   ???  allopurinol (ZYLOPRIM) 100 MG tablet, Take 1 tablet (100 mg total) by mouth daily. (Patient not taking: Reported on 11/11/2017), Disp: 30 tablet, Rfl: 0  ???  atorvastatin (LIPITOR) 40 MG tablet, Take 40 mg by mouth daily., Disp: , Rfl:   ???  fluconazole (DIFLUCAN) 100 MG tablet, Take 100 mg by mouth daily., Disp: , Rfl:   ???  hydroxyurea (HYDREA) 500 mg capsule, Take 1 tablet once daily (Patient not taking: Reported on 11/11/2017), Disp: 20 capsule, Rfl: 0  ???  levoFLOXacin (LEVAQUIN) 500 MG tablet, Take 500 mg by mouth daily., Disp: , Rfl:   ???  ondansetron (ZOFRAN) 8 MG tablet, Take 1 tablet (8 mg total) by mouth every twelve (12) hours as needed for nausea., Disp: 20 tablet, Rfl: 0  ???  polyethylene glycol (MIRALAX) 17 gram packet, MIX 1 PACKET OF POWDER (17 GM) IN 8 OZ OF JUICE OR WATER AND DRINK BY MOUTH DAILY, Disp: , Rfl: 6  ???  prochlorperazine (COMPAZINE) 10 MG tablet, Take 1 tablet (10 mg total) by mouth every six (6) hours as needed for nausea. for up to 20 doses (Patient not taking: Reported on 11/11/2017), Disp: 20 tablet, Rfl: 0  ???  ranitidine (ZANTAC) 150 MG tablet, Take 1 tablet (150 mg total) by mouth Two (2) times a day., Disp: 60 tablet, Rfl: 3  ???  venetoclax (VENCLEXTA) 100 mg tablet, Take 4 tablets (400 mg total) by mouth daily. Take with a meal and water. Do not chew, crush, or break tablets., Disp: 120 tablet, Rfl: 6    Facility-Administered Medications Ordered in Other Encounters:   ???  azaCITIDine (VIDAZA) syringe 147 mg, 75 mg/m2 (Order-Specific), Subcutaneous, Once, Velia Meyer, MD  ???  dexamethasone (DECADRON) tablet 4 mg, 4 mg, Oral, Daily, Velia Meyer, MD  ???  OKAY TO SEND MEDICATION/CHEMOTHERAPY TO UNIT, , Other, Once, Velia Meyer, MD  ???  ondansetron [facility]) tablet 16 mg, 16 mg, Oral, Once, Velia Meyer, MD    Allergies  Ace inhibitors; Shellfish containing products; and Zithromax [azithromycin]    Family History   Problem Relation Age of Onset   ??? Heart disease Mother    ??? Heart disease Father        Social History  Social History   Substance Use Topics   ??? Smoking status: Former Smoker     Packs/day: 1.00     Years: 50.00     Types: Cigarettes     Quit date: 1988   ??? Smokeless tobacco: Never Used Comment: occasionally smoked a cigar after quitiing cigaretts.    ??? Alcohol use No       Review of Systems  Constitutional: Positive for fever, fatigue, malaise, decreased appetite.  Eyes: Negative for visual changes.  ENT: Negative for sore throat.  Cardiovascular: Negative for chest pain.  Respiratory: Positive for shortness of breath and cough.  Gastrointestinal: Positive for vomiting. Negative for abdominal pain or diarrhea.  Genitourinary: Negative for dysuria.  Musculoskeletal: Negative for back pain.  Skin: Negative for rash.  Neurological: Negative for headaches, focal weakness or numbness.  Physical Exam     VITAL SIGNS:    ED Triage Vitals [11/11/17 1510]   Enc Vitals Group      BP 131/74      Heart Rate 103      SpO2 Pulse       Resp 16      Temp 36.4 ??C (97.5 ??F)      Temp Source Axillary      SpO2 98 %     General: Cachectic, elderly, thin appearing. In NAD.  Eyes: Conjunctivae are normal.  ENT       Head: Normocephalic and atraumatic.       Nose: No congestion.       Mouth/Throat: Mucous membranes are dry.       Neck: No stridor.  Cardiovascular: Regular rate and rhythm, Normal S1 and S2. No murmurs appreciated.  Respiratory: Normal respiratory effort. Breath sounds are equal bilaterally. No wheezes or crackles.   Gastrointestinal: Soft and nontender.   Musculoskeletal: Nontender with normal range of motion in all extremities.       Right lower leg: No tenderness or edema.       Left lower leg: No tenderness or edema.  Neurologic: Normal speech and language. No gross focal neurologic deficits are appreciated.  Skin: Skin is warm, dry and intact. No rash noted.  Psychiatric: Mood and affect are normal. Speech and behavior are normal.    Radiology/EKG     Xr Chest 2 Views    Result Date: 11/11/2017  EXAM: XR CHEST 2 VIEWS DATE: 11/11/2017 4:55 PM ACCESSION: [id number] UN DICTATED: 11/11/2017 4:56 PM INTERPRETATION LOCATION: [facility] CLINICAL INDICATION: 71 years old Male with OTHER--  COMPARISON: None TECHNIQUE: PA and Lateral Chest Radiographs. FINDINGS: Well-inflated lungs. Radiographically clear. No focal consolidation or pulmonary edema. No pleural effusion or pneumothorax. Unremarkable cardiomediastinal silhouette. Multilevel degenerative disc disease.      No acute airspace disease.        Documentation assistance was provided by Franchot Erichsen, Scribe, on November 11, 2017 at 3:19 PM for Loyce Dys, MD.     Documentation assistance was provided by the scribe in my presence.  The documentation recorded by the scribe has been reviewed by me and accurately reflects the services I personally performed.         Verlin Dike, MD  Resident  11/12/17 [phone]

## 2017-11-11 NOTE — Unmapped (Addendum)
Mr. Arthur Bates is a 71 year old male with AML who presented to [facility] on 11/11/2017 for cycle 2 of Azacitadine, as well as dehydration, poor PO intake, weight loss.  The patient was s/p one cycle of Azacytidine on presentation, with the second cycle delayed for almost a month due to functional decline, likely due to untreated AML.      AML: The patient was diagnosed in October of 2018, Flow- 39% blasts- CD34+ (subset), CD13, CD15, CD7, CD11c ,CD33, CD117, HLA-DR. Normal cytogenetics and no molecular mutations assessed. The patient was initially treated with Hydrea for WBC > 10,000.  Cycle 2 of Azacitidine was started on 11/12/2017.  Azacitidine completed on 11/18/2017. He was then ramped up with venetoclax to 400 mg starting 1/4.  On 1/8, started on posaconazole for ppx, and venetoclax dose decreased to 100 mg daily on 1/10 prior to discharge.  He is continued on levaquin, posaconazole, and acyclovir ppx.     Cough: patient reported a productive, intermittent cough for about one month prior to his presentation.  Repeat CT chest on 11/15/2017 showed bibasilar subplerual opacities, likely fibrosis.  No consolidations or discrete nodules to suggest infection.     Neutropenic Fever: Blood cultures negative.  Treated with IV Cefepime from 12/31 - 1/4 and transitioned to Levaquin on 1/4- 1/10 without recurring fevers.     Dehydration, Malnutrition: Patient reported he had lost 20 pounds in about two weeks.  He had poor appetite but no dysphagia and no nausea or vomiting. On presentation lactate elevated to 2.3, which resolved with IV fluids.  Nutrition was consulted.  Remeron 7.5 mg nightly was started for appetite stimulation. Patient continued to functionally decline with poor appetite so Remeron increased to 15 mg with good results. Given his functional decline while hospitalized an MRI brain was obtained without evidence of intracranial abnormalities.  To complete workup we obtained LP to rule out CNS leukemia, which was negative.   His appetite began to improve by time of discharge.     Constipation: Patient has 1-2 BM's per week at baseline.  Has not had BM for 4 days prior to discharge.   Increased bowel regimen with scheduled miralax and senna BID.  Will need to continue to monitor this at outside facility.     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  Lipitor was held during this admission.

## 2017-11-11 NOTE — Unmapped (Signed)
Initial Visit Note    Patient Name: Arthur Bates  Patient Age: 71 y.o.  Encounter Date: 11/11/2017    Referring Physician:     Referred Self  No address on file    Primary Care Provider:  Richardean Chimera, MD    Reason for visit: New Patient Appointment from [facility]    Assessment:  71 yo M with newly diagnosed AML in 08/2017, normal cytogenetics, mutational status unknown, s/p 1 cycle of Azactidine in November, 2018 but did not receive cycle 2 due to various reasons presents to clinic with hyperleukocytosis and failure to thrive.     I saw Arthur Bates today to continue his planned Azacitidine that was scheduled at [facility] but was not able to be given there due to staffing issues. Given the complexity of the patient, I had an extensive discussion with him regarding his diagnosis and management. I recommended that he be admitted today for failure to thrive and to continue his Azacitidine chemotherapy. I would recommend IVF's, anti-emetics, and cytoreduction to improve the patient's overall performance status and clinical status.     I would recommend Hydroxyurea 2,000 mg twice daily with goal WBC <10,000. I would recommend continuing Azacitidine 75 mg/m2 SQ days 1-7 while he is hospitalized given that cycle 2 is significantly delayed. We discussed the new SOC of Azacitidine + Venetoclax for newly diagnosed AML (Dinardo et al. Blood 2018). I do not suspect that Azacitidine will be effective for his rapidly proliferative monocytic AML. He has also not had mutational testing done that I can see in his records. I sent off PB Myeloid Mutational Panel and FLT3 testing today. If he has a FLT3-ITD mutation, I would recommend adding Sorafenib to Azacitidine per Ilda Basset et al. Othella Boyer J Hematol 2018. If FLT3-ITD mutation negative, then I would recommend adding Venetoclax with dose ramp-up to his Azacitidine. This is being worked on by Administrator, arts. In the meantime, I would recommend continuing cycle 2 of Azacitidine in the hospital with Hydroxyurea cytoreduction.     I discussed with the patient that we have expertise to follow him here and manage his AML. He is willing to follow up with [place] at [facility] for further management of his AML. I discussed with him that he can get labs and transfusion support at [facility] but he will continue to follow up with [place] at [facility] for management of his AML. I would recommend repeating a bone marrow biopsy at [facility] after this cycle of treatment for disease assessment.       Plans and Recommendations:  1) Admission for failure to thrive and hyperleukocytosis  2) Hydroxyurea 2 gm PO Bid until WBC <10,000  3) F/U PB FLT3 testing sent off today- results should be back on Friday, 12/28  4) Azacitidine cycle 2 as inpatient- 75 mg/m2 SQ days 1-7  5) Continuous IVF's  6) D/C prophylactic antibiotics as patient is not neutropenic- cont. Acyclovir  7) D/C Lipitor  8) Allopurinol should be given 300 mg daily    History of Present Illness:   Arthur Bates is a 71 yo M with AML who is being treated at [facility] and being seen here at [facility] for help in management given staffing issues at [facility].     He was in his [facility] until October, 2018 when he first began feeling ill and has continued feeling ill since then. 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 He  was treated at [facility] with Azacitidine cycle 1 on 09/22/2017.  Arthur Bates is a patient at [facility] with AML. Arthur Bates has been lying in the recliner all day. He has no energy. He has no appetite. He drinks Ensure. He has been having nausea/vomiting the last 3 days. He barely has the energy to get out of bed. He has lost 20 lbs in 2 weeks. He has had low grade fevers up to 100F.    Oncology History    Diagnosis: AML    Presentation: Pancytopenia    Bone marrow biopsy: 09/14/2017  Hypercellular (>95%) marrow with approximately 40% blasts  Dysmegakaryopoiesis  Aspirate- predominance of blasts- no manual aspirate differential given    Flow- 39% blasts- CD34+ (subset), CD13, CD15, CD7, CD11c ,CD33, CD117, HLA-DR    Cytogenetics: Normal    Molecular Mutations: Not done    Treatment:  Azacitidine 75 mg/m2 SQ days 1-7  Cycle 1: 11/07        Acute myeloid leukemia (AML), M5 (CMS-HCC)    09/17/2017 Initial Diagnosis     Acute myeloid leukemia (AML), M5 (CMS-HCC)          Review of Systems:   12-system ROS otherwise negative except stated in HPI    Allergies:  Allergies   Allergen Reactions   ??? Ace Inhibitors Cough   ??? Shellfish Containing Products Nausea And Vomiting   ??? Zithromax [Azithromycin] Rash       Medications:     Current Outpatient Prescriptions:   ???  acyclovir (ZOVIRAX) 400 MG tablet, Take 400 mg by mouth Two (2) times a day., Disp: , Rfl:   ???  atorvastatin (LIPITOR) 40 MG tablet, Take 40 mg by mouth daily., Disp: , Rfl:   ???  fluconazole (DIFLUCAN) 100 MG tablet, Take 100 mg by mouth daily., Disp: , Rfl:   ???  levoFLOXacin (LEVAQUIN) 500 MG tablet, Take 500 mg by mouth daily., Disp: , Rfl:   ???  ondansetron (ZOFRAN) 8 MG tablet, Take 1 tablet (8 mg total) by mouth every twelve (12) hours as needed for nausea., Disp: 20 tablet, Rfl: 0  ???  ranitidine (ZANTAC) 150 MG tablet, Take 1 tablet (150 mg total) by mouth Two (2) times a day., Disp: 60 tablet, Rfl: 3  ???  allopurinol (ZYLOPRIM) 100 MG tablet, Take 1 tablet (100 mg total) by mouth daily. (Patient not taking: Reported on 11/11/2017), Disp: 30 tablet, Rfl: 0  ???  hydroxyurea (HYDREA) 500 mg capsule, Take 1 tablet once daily (Patient not taking: Reported on 11/11/2017), Disp: 20 capsule, Rfl: 0  ???  polyethylene glycol (MIRALAX) 17 gram packet, MIX 1 PACKET OF POWDER (17 GM) IN 8 OZ OF JUICE OR WATER AND DRINK BY MOUTH DAILY, Disp: , Rfl: 6  ???  prochlorperazine (COMPAZINE) 10 MG tablet, Take 1 tablet (10 mg total) by mouth every six (6) hours as needed for nausea. for up to 20 doses (Patient not taking: Reported on 11/11/2017), Disp: 20 tablet, Rfl: 0  No current facility-administered medications for this visit.     Facility-Administered Medications Ordered in Other Visits:   ???  azaCITIDine (VIDAZA) syringe 147 mg, 75 mg/m2 (Order-Specific), Subcutaneous, Once, Velia Meyer, MD  ???  dexamethasone (DECADRON) tablet 4 mg, 4 mg, Oral, Daily, Velia Meyer, MD  ???  OKAY TO SEND MEDICATION/CHEMOTHERAPY TO UNIT, , Other, Once, Velia Meyer, MD  ???  ondansetron [facility]) tablet 16 mg, 16 mg, Oral, Once, Velia Meyer, MD  Medical History:  Past Medical History:   Diagnosis Date   ??? Coronary artery disease    ??? Depression    ??? Dissecting aortic aneurysm (any part), abdominal (CMS-HCC)    ??? ED (erectile dysfunction)    ??? GERD (gastroesophageal reflux disease)    ??? HTN (hypertension)    ??? Hyperlipidemia    ??? Ischemic heart disease    ??? 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 Heart disease Mother    ??? Heart disease Father      ECOG PS = 3    Objective:   BP 133/80  - Pulse 97  - Temp 36.4 ??C (97.5 ??F) (Oral)  - Resp 18  - Ht 179.5 cm (5' 10.67)  - Wt 70.5 kg (155 lb 6.4 oz)  - SpO2 98%  - BMI 21.88 kg/m??     Physical Exam:  General: Cachectic appearing  Skin: No rashes  HEENT: PERRL, EOMI  Lymph Node Exam: No LAD in submandibular regions bilaterally  CVS: RRR, No M/R/G  Lungs: Decreased breath sounds at bases  Abdomen: Soft, NT/ND, no splenomegaly  Musculoskeletal: 4/5 strength in UE/LE  Neuro: CN intact  Lower Extremities: WWP, no edema bilaterally    Test Results:  Recent Results (from the past 24 hour(s))   Myeloid Mutation Panel - AML    Collection Time: 11/11/17 12:17 PM   Result Value Ref Range    Collection     Uric acid    Collection Time: 11/11/17 12:17 PM   Result Value Ref Range    Uric Acid 5.0 4.0 - 9.0 mg/dL   Lactate dehydrogenase    Collection Time: 11/11/17 12:17 PM   Result Value Ref Range    LDH 1,532 (H) 338 - 610 U/L   Phosphorus Level    Collection Time: 11/11/17 12:17 PM   Result Value Ref Range    Phosphorus 4.8 (H) 2.9 - 4.7 mg/dL   Comprehensive Metabolic Panel    Collection Time: 11/11/17 12:17 PM   Result Value Ref Range    Sodium 137 135 - 145 mmol/L    Potassium 3.7 3.5 - 5.0 mmol/L    Chloride 98 98 - 107 mmol/L    CO2 28.0 22.0 - 30.0 mmol/L    BUN 19 7 - 21 mg/dL    Creatinine 6.29 5.28 - 1.30 mg/dL    BUN/Creatinine Ratio 18     EGFR MDRD Non Af Amer >=60 >=60 mL/min/1.72m2    EGFR MDRD Af Amer >=60 >=60 mL/min/1.31m2    Anion Gap 11 9 - 15 mmol/L    Glucose 136 65 - 179 mg/dL    Calcium 9.2 8.5 - 41.3 mg/dL    Albumin 3.5 3.5 - 5.0 g/dL    Total Protein 6.7 6.5 - 8.3 g/dL    Total Bilirubin 1.0 0.0 - 1.2 mg/dL    AST 69 (H) 19 - 55 U/L    ALT 60 19 - 72 U/L    Alkaline Phosphatase 261 (H) 38 - 126 U/L   Type and Screen    Collection Time: 11/11/17 12:17 PM   Result Value Ref Range    Check Type Needed Type  Check     Antibody Screen NEG     ABO Grouping Invalid     Blood Type B POS    CBC w/ Differential    Collection Time: 11/11/17 12:17 PM   Result Value Ref Range    WBC 34.8 (H) 4.5 - 11.0 10*9/L    RBC 2.78 (L) 4.50 - 5.90 10*12/L    HGB 8.6 (L) 13.5 - 17.5 g/dL    HCT 13.0 (L) 86.5 - 53.0 %    MCV 90.9 80.0 - 100.0 fL    MCH 30.9 26.0 - 34.0 pg    MCHC 34.0 31.0 - 37.0 g/dL    RDW 78.4 (H) 69.6 - 15.0 %    MPV 10.6 (H) 7.0 - 10.0 fL    Platelet 175 150 - 440 10*9/L   Pathologist Smear Review    Collection Time: 11/11/17 12:17 PM   Result Value Ref Range    Pathologist Smear Interpretation Confirmed by Hemepath Specialist/Senior Tech    Manual Differential    Collection Time: 11/11/17 12:17 PM   Result Value Ref Range    Blasts % 12 (HH) <=0 %    Absolute Neutrophils 7.7 (H) 2.0 - 7.5 10*9/L    Absolute Lymphocytes 3.8 1.5 - 5.0 10*9/L    Absolute Monocytes 18.8 (H) 0.2 - 0.8 10*9/L Absolute Eosinophils 0.3 0.0 - 0.4 10*9/L    Absolute Basophils 0.0 0.0 - 0.1 10*9/L    Smear Review Comments See Comment (A) Undefined         Cindra Presume, MD

## 2017-11-11 NOTE — Unmapped (Addendum)
Pt sent down from clinic due to weakness, currently on chemo for AML. Pt appears pale, w subjective fever.

## 2017-11-11 NOTE — Unmapped (Signed)
We will admit you to the hospital today and continue your Azacitidine chemotherapy.

## 2017-11-11 NOTE — Unmapped (Signed)
Bed: 18-B  Expected date:   Expected time:   Means of arrival:   Comments:  Hem/onc

## 2017-11-11 NOTE — Unmapped (Signed)
Patient rounds completed. The following patient needs were addressed:  Personal Belongings, Plan of Care, Call Bell in Reach and Bed Position Low     Pt resting in bed. Family at bedside. NAD .

## 2017-11-12 LAB — HEPATIC FUNCTION PANEL
ALBUMIN: 2.5 g/dL — ABNORMAL LOW (ref 3.5–5.0)
ALKALINE PHOSPHATASE: 200 U/L — ABNORMAL HIGH (ref 38–126)
PROTEIN TOTAL: 5 g/dL — ABNORMAL LOW (ref 6.5–8.3)

## 2017-11-12 LAB — BASIC METABOLIC PANEL
ANION GAP: 6 mmol/L — ABNORMAL LOW (ref 9–15)
BLOOD UREA NITROGEN: 19 mg/dL (ref 7–21)
BUN / CREAT RATIO: 18
CALCIUM: 8.2 mg/dL — ABNORMAL LOW (ref 8.5–10.2)
CHLORIDE: 103 mmol/L (ref 98–107)
CO2: 28 mmol/L (ref 22.0–30.0)
EGFR MDRD AF AMER: >=60 mL/min/{1.73_m2} (ref >=60)
EGFR MDRD NON AF AMER: >=60 mL/min/{1.73_m2} (ref >=60)
GLUCOSE RANDOM: 99 mg/dL (ref 65–179)
POTASSIUM: 4.3 mmol/L (ref 3.5–5.0)
SODIUM: 137 mmol/L (ref 135–145)

## 2017-11-12 LAB — LACTATE DEHYDROGENASE: Lactate dehydrogenase:CCnc:Pt:Ser/Plas:Qn:: 1239 — ABNORMAL HIGH

## 2017-11-12 LAB — COMPREHENSIVE METABOLIC PANEL
ALBUMIN: 2.5 g/dL — ABNORMAL LOW (ref 3.5–5.0)
ALKALINE PHOSPHATASE: 200 U/L — ABNORMAL HIGH (ref 38–126)
ALT (SGPT): 52 U/L (ref 19–72)
ANION GAP: 6 mmol/L — ABNORMAL LOW (ref 9–15)
AST (SGOT): 53 U/L (ref 19–55)
BILIRUBIN TOTAL: 0.6 mg/dL (ref 0.0–1.2)
BUN / CREAT RATIO: 18
CALCIUM: 8.2 mg/dL — ABNORMAL LOW (ref 8.5–10.2)
CHLORIDE: 103 mmol/L (ref 98–107)
CREATININE: 1.06 mg/dL (ref 0.70–1.30)
EGFR MDRD AF AMER: >=60 mL/min/{1.73_m2} (ref >=60)
EGFR MDRD NON AF AMER: >=60 mL/min/{1.73_m2} (ref >=60)
GLUCOSE RANDOM: 99 mg/dL (ref 65–179)
POTASSIUM: 4.3 mmol/L (ref 3.5–5.0)
PROTEIN TOTAL: 5 g/dL — ABNORMAL LOW (ref 6.5–8.3)
SODIUM: 137 mmol/L (ref 135–145)

## 2017-11-12 LAB — ALKALINE PHOSPHATASE: Alkaline phosphatase:CCnc:Pt:Ser/Plas:Qn:: 200 — ABNORMAL HIGH

## 2017-11-12 LAB — RED CELL DISTRIBUTION WIDTH: Lab: 15.5 — ABNORMAL HIGH

## 2017-11-12 LAB — PHOSPHORUS: Phosphate:MCnc:Pt:Ser/Plas:Qn:: 4.1

## 2017-11-12 LAB — CBC W/ AUTO DIFF
HEMATOCRIT: 21.2 % — ABNORMAL LOW (ref 41.0–53.0)
HEMOGLOBIN: 6.9 g/dL — ABNORMAL LOW (ref 13.5–17.5)
MEAN CORPUSCULAR HEMOGLOBIN CONC: 32.8 g/dL (ref 31.0–37.0)
MEAN CORPUSCULAR HEMOGLOBIN: 30.3 pg (ref 26.0–34.0)
MEAN PLATELET VOLUME: 10.7 fL — ABNORMAL HIGH (ref 7.0–10.0)
PLATELET COUNT: 157 10*9/L (ref 150–440)
RED BLOOD CELL COUNT: 2.29 10*12/L — ABNORMAL LOW (ref 4.50–5.90)
RED CELL DISTRIBUTION WIDTH: 15.5 % — ABNORMAL HIGH (ref 12.0–15.0)

## 2017-11-12 LAB — ALBUMIN: Albumin:MCnc:Pt:Ser/Plas:Qn:: 2.5 — ABNORMAL LOW

## 2017-11-12 LAB — CALCIUM: Calcium:MCnc:Pt:Ser/Plas:Qn:: 8.2 — ABNORMAL LOW

## 2017-11-12 LAB — MANUAL DIFFERENTIAL
BASOPHILS - ABS (DIFF): 0 10*9/L (ref 0.0–0.1)
BLASTS - REL (DIFF): 9 % (ref <=0)
EOSINOPHILS - ABS (DIFF): 0 10*9/L (ref 0.0–0.4)
MONOCYTES - ABS (DIFF): 20.6 10*9/L — ABNORMAL HIGH (ref 0.2–0.8)
NEUTROPHILS - ABS (DIFF): 3.9 10*9/L (ref 2.0–7.5)

## 2017-11-12 LAB — MAGNESIUM
MAGNESIUM: 1.7 mg/dL (ref 1.6–2.2)
Magnesium:MCnc:Pt:Ser/Plas:Qn:: 1.7

## 2017-11-12 LAB — URIC ACID
Urate:MCnc:Pt:Ser/Plas:Qn:: 5.1
Urate:MCnc:Pt:Ser/Plas:Qn:: 5.8

## 2017-11-12 LAB — NEUTROPHILS - ABS (DIFF): Lab: 3.9

## 2017-11-12 NOTE — Unmapped (Signed)
PHYSICAL THERAPY  Evaluation (With Jaclyn Shaggy, OT ) (11/12/17 1110)     Patient Name:  Arthur Bates       Medical Record Number: [id number]   Date of Birth: 1946/09/05  Sex: Male            Treatment Diagnosis: Generalized weakness, gait instability     ASSESSMENT    Dashon Mcintire is a 71 y.o. male with PMHx as noted below that presents to [facility] with Acute myeloid leukemia. Pt presented with decreased strength and endurance but was willing to work with clinicans today. Pt performed bed mobility independnetly and was able to ambulate to the bathroom with CGA and RW which is a distance that pt reports is his baseline. Pt displayed steady, even gait but was fatigued after ambulation which may be 2/2 low HGB or due to pts recent limited caloric intake althoguht pt reports he does not ambulate further while at home. Based on decreased strength, endurance and AMPAC score of 18/20 pt may benefit from continued acute care PT f/u as well as PT f/u 3X post d/c.         Today's Interventions: AMPAC: 18/20, Pt educated on PT, PT POC, benefits of OOB mobility, activity pacing, diaphragmatic breathing. Encouraged to walk with nursing staff during the day.     Activity Tolerance: Patient tolerated treatment well;Patient limited by fatigue    PLAN  Planned Frequency of Treatment:  1-2x per day for: 2-3x week      Planned Interventions: Diaphragmatic / Pursed-lip breathing;Education - Patient;Education - Family / caregiver;Endurance activities;Functional mobility;Gait training;Transfer training;Therapeutic activity;Therapeutic exercise;Stair training;Self-care / Home training;Postural re-education;Neuromuscular re-education;Home exercise program    Post-Discharge Physical Therapy Recommendations:  3x weekly        PT DME Recommendations: None     Goals:   Patient and Family Goals: To go home     Long Term Goal #1: In 4 weeks, pt will ambulate 140ft modified indpendently with LRAD to improve functional independence SHORT GOAL #1: Pt will perform all functional transfers modified independently with LRAD               Time Frame : 2 weeks  SHORT GOAL #2: Pt will ambulate 50 ft modified independently with LRAD               Time Frame : 2 weeks  SHORT GOAL #3: Pt will tolerate OOB sitting > 2hours               Time Frame : 1 week                                        Prognosis:  Fair  Barriers to Discharge: Endurance deficits;Motivation  Positive Indicators: Participation     SUBJECTIVE  Patient reports: I don't have a problem cooking or cleaning because I don't do it   Current Functional Status: Pt received in bed, session ended with pt sitting on couch, needs within reach, RN made aware.   Services patient receives: PT;OT  Prior functional status: Pt reports using a RW when my wife puts it in front of me and furniture surfing. Pt is a limited household ambulator that typically only ambulates from recliner to bathroom.   Equipment available at home: [employer]    Past Medical History:   Diagnosis Date   ??? Coronary artery disease    ???  Depression    ??? Dissecting aortic aneurysm (any part), abdominal (CMS-HCC)    ??? ED (erectile dysfunction)    ??? GERD (gastroesophageal reflux disease)    ??? HTN (hypertension)    ??? Hyperlipidemia    ??? Ischemic heart disease    ??? Osteoarthritis of knee     Social History   Substance Use Topics   ??? Smoking status: Former Smoker     Packs/day: 1.00     Years: 50.00     Types: Cigarettes     Quit date: 1988   ??? Smokeless tobacco: Never Used      Comment: occasionally smoked a cigar after quitiing cigaretts.    ??? Alcohol use No      Past Surgical History:   Procedure Laterality Date   ??? CARDIAC CATHETERIZATION     ??? COLONOSCOPY  2009   ??? CORONARY ANGIOPLASTY WITH STENT PLACEMENT     ??? KNEE SURGERY     ??? TOTAL KNEE ARTHROPLASTY      Family History   Problem Relation Age of Onset   ??? Heart disease Mother    ??? Heart disease Father         Allergies: Ace inhibitors; Shellfish containing products; and Zithromax [azithromycin]                Objective Findings              Precautions: Falls              Weight Bearing Status: Non- applicable              Required Braces or Orthoses: Non- applicable    Communication Preference: Verbal  Pain Comments: Pt endorses pain during sneezing   Medical Tests / Procedures: Charts, imaging, labs reviewed in Epic.   Equipment / Environment: Vascular access (PIV, TLC, Port-a-cath, PICC);Telemetry    At Rest: VSS per Epic  With Activity: 89% on RA upon sitting that elevated to 94% with rest break, 100% on RA upon sitting on couch after ambulation   Orthostatics: Asymptomatic       Living environment: House  Lives With: Spouse  Home Living: One level home;Level entry             Cognition: Answered all questions appropriately      Skin Inspection: Visible skin intact    UE ROM: WFL   UE Strength: WFL   LE ROM: WFL   LE Strength: L knee flexion: 3/5, R knee flexion: 4/5, B knee extension: 4/5, L ankle DF/PF: 3+/5, R ankle DF/PF: 4/5                     Proprioception: Reports being able to feel both feet on the ground equally  Sensation: Reports N/T in lateral 4 toes on L side   Balance: pt sat EOB independently, pt stood with RW and supervision          Bed Mobility: rolling: independent, supine<>sit: independent  Transfers: sit<>stand: CGA, pt had poor eccentric control upon sitting on couch    Gait: Pt ambulated ~30 ft with RW and CGA. Pt had steady, even gait and no LOB. Pt reports feeling steady on his feet.           Endurance: Fair, vitals remained stable during session     Eval Duration(PT): 15 Min.    Medical Staff Made Aware: RN Mayme Genta   PT G-Codes  Functional Assessment Tool Used: AM-PAC  Score: 18/20  Functional Limitation: Mobility: Walking and moving around  Mobility: Walking and Moving Around Current Status [phone]): At least 20 percent but less than 40 percent impaired, limited or restricted  Mobility: Walking and Moving Around Goal Status [phone]): At least 1 percent but less than 20 percent impaired, limited or restricted  Rationale: Based on the AM-PAC score of 18/20, the patient is considered to be 29.65% impaired with basic mobility.  I attest that I have reviewed the above information.  Signed: Fernanda Drum, PT  Filed 11/12/2017     I was physically present and immediately available to direct and supervise tasks that were related to patient management. The direction and supervision was continuous throughout the time these tasks were performed.     Fernanda Drum, PT

## 2017-11-12 NOTE — Unmapped (Signed)
OCCUPATIONAL THERAPY  Evaluation (11/12/17 1050) with PT, Arthur Bates    Patient Name:  Arthur Bates       Medical Record Number: [id number]   Date of Birth: 08-09-1946  Sex: Male          OT Treatment Diagnosis:  Weakness, decreased dynamic balance     Assessment  to acute OT with weakness and decreased dynamic balance from baseline. Pt is completing standing ADL and short functional mobility with CGA and RW. He reports that he normally furniture walks at home but has yet to have a fall. He will benefit from skilled acute OT to maximize functional independence and safety. Based on the daily activity AM-PAC raw score of 21/24, the pt is considered to be 32.79% impaired with self care. This indicates post acute OT 3x/wk at discharge.         Today's Interventions: Adaptive equipment;Education - Patient;Home exercise program;Safety education;Transfer training;Positioning;ADL retraining;Balance activities;Compensatory tech. training;Functional mobility;Endurance activities    Activity Tolerance During Today's Session  Patient tolerated treatment well    Plan  Planned Frequency of Treatment:  1-2x per day for: 2-3x week       Planned Interventions:  Adaptive equipment;ADL retraining;Balance activities;Compensatory tech. training;Bed mobility;Conservation;Education - Patient;Home exercise program;UE Strength / coordination exercise;Functional mobility;Range of motion;Transfer training;Safety education;Functional cognition;Positioning;Therapeutic exercise;Endurance activities;Education - Family / caregiver    Post-Discharge Occupational Therapy Recommendations:  OT Post Acute Discharge Recommendations: 3x weekly    ;         GOALS:   Patient and Family Goals: To go home soon     Long Term Goal #1: Pt will score 24/24 on AMPAC in 2 months        Short Term:  Pt will don and doff pants with setup A    Time Frame : 1 week  Pt will complete toileting and toilet transfer with Mod I    Time Frame : 1 week  Pt will complete 5 minute grooming at sink with Mod I    Time Frame : 1 week                  Prognosis:  Good  Positive Indicators:  CLOF/PLOF  Barriers to Discharge: None    Subjective  Current Status Pt left sitting on couch with RN Arthur Bates aware, all immediate needs met   Prior Functional Status Pt lives with spouse and great grandchildren. Reports showers are difficult but he has a bench that helps. Typically independent with ADL-wife can help. She does all IADL. He has a RW that he sometimes uses     Medical Tests / Procedures: Reviewed   Services patient receives: PT;OT  Patient / Caregiver reports: I usually just walk from my bedroom to the kitchen    Past Medical History:   Diagnosis Date   ??? Coronary artery disease    ??? Depression    ??? Dissecting aortic aneurysm (any part), abdominal (CMS-HCC)    ??? ED (erectile dysfunction)    ??? GERD (gastroesophageal reflux disease)    ??? HTN (hypertension)    ??? Hyperlipidemia    ??? Ischemic heart disease    ??? Osteoarthritis of knee     Social History   Substance Use Topics   ??? Smoking status: Former Smoker     Packs/day: 1.00     Years: 50.00     Types: Cigarettes     Quit date: 1988   ??? Smokeless tobacco: Never Used  Comment: occasionally smoked a cigar after quitiing cigaretts.    ??? Alcohol use No      Past Surgical History:   Procedure Laterality Date   ??? CARDIAC CATHETERIZATION     ??? COLONOSCOPY  2009   ??? CORONARY ANGIOPLASTY WITH STENT PLACEMENT     ??? KNEE SURGERY     ??? TOTAL KNEE ARTHROPLASTY      Family History   Problem Relation Age of Onset   ??? Heart disease Mother    ??? Heart disease Father         Ace inhibitors; Shellfish containing products; and Zithromax [azithromycin]     Objective Findings  Precautions / Restrictions  Falls precautions    Weight Bearing  Non-applicable    Required Braces or Orthoses  Non-applicable    Communication Preference  Verbal    Pain  Pt denies pain     Equipment / Environment  Vascular access (PIV, TLC, Port-a-cath, PICC)    Living Situation   Living environment: House   Lives With: Spouse   Home Living: One level home;Level entry   Equipment available at home: [employer]            Cognition   Orientation Level:  Oriented x 4   Arousal/Alertness:  Appropriate responses to stimuli   Attention Span:      Memory:      Following Commands:  Follows multistep commands without difficulty   Safety Judgment:  Good awareness of safety precautions   Awareness of Errors:  Good awareness of safety precautions   Problem Solving:      Comments: Pt oriented and following all commands.     Vision / Perception  Vision: Wears glasses for reading only          Hand Function  Hand Dominance: R   Good B) grip     Skin Inspection  Visible skin intact    ROM / Strength/Coordination  UE ROM/ Strength/ Coordination: BUE ROM and strength WFL   LE ROM/ Strength/ Coordination: WFL     Sensation:  Reports numbness in 2nd, 3rd, 4th, 5th digits of toes of L) foot     Balance:  Supervision sitting, SBA static standing with RW     Mobility/Gait/Transfers: SBA sup<>sit EOB, SBA sit<>stand with RW, CGA for functional mobility around room and into bathroom with RW, cueing for safe use of RW     ADL:     Grooming: CGA at sink   Dressing: LB: Setup A for socks using figure four technique  Eating: Independent  Toileting: CGA from regular height toilet with RW, CGA for toileting       Vitals/ Orthostatics:  At Rest: SpO2: 89% on RA sitting EOB improved to 94%  With Activity: SpO2: 100% on RA after mobility        Interventions Performed During Today's Session: AMPAC: 21/24. Functional mobility around room and into bathroom, toileting activity and grooming at sink. Pt education re: role of acute OT, POC, use of RW and safe hand placement with RW, importance of calling for all mobility, importance of OOB to chair and daily exercises to prevent deconditioning     OT G-codes  Functional Assessment Tool Used: AMPAC  Score: 21  Functional Limitation: Self care  Self Care Current Status (Z6109): At least 20 percent but less than 40 percent impaired, limited or restricted  Self Care Goal Status (U0454): At least 1 percent but less than 20 percent impaired, limited or restricted  Rationale: 32.79% impaired with ADL     Eval Duration (OT): 28 Min.    Medical Staff Made Aware: RN aware    I attest that I have reviewed the above information.  Signed: Jaclyn Shaggy, OT  Filed 11/12/2017

## 2017-11-12 NOTE — Unmapped (Signed)
Adult Nutrition Consult     Visit Type: RN Consult via Interdisciplinary Screening and Assessment Form. Identifiers: Loss of body weight without trying and Decreased appetite over the last month  Reason for Visit:  Assessment    ASSESSMENT:   HPI & PMH: Arthur Bates is a 71 y.o. male with a history CAD w/ prior MI (11/01/09) now s/p bare metal stent (BMS) placement ([facility]), hypertension, prior small bowel obstruction, GERD, renal calculi s/p stone removal (circa early 1980s) and degenerative joint disease s/p bilateral knee replacement (possibly 2012 (L) and 2016 (R) at [place], [place]) admitted 11/11/2017. History is gathered from the patient, his family, and prior notes.     This was his first time meeting Dr. Vertell Limber; he had normally been treated at [facility], receiving cycle 1 of azacitadine on 09/22/2017, but his doctor fell sick and he came to [facility] to start cycle 2 of azacitadine. He reportedly got his first cycle of azacitadine about two months prior to present admission. The patient endorsed a 20 lb weight loss over two weeks and reported poor energy and low appetite. He also reported nausea and vomiting worsened by his medications along with difficulty swallowing (not necessarily that the food gets stuck, he just can't swallow them). The patient's wife has been trying Boost and Ensure supplements at home. The patient does not wear dentures and has reportedly not endorsed any tooth pain. He reports low energy and spends a lot of time sleeping in his recliner.   Past Medical History:   Diagnosis Date   ??? Coronary artery disease    ??? Depression    ??? Dissecting aortic aneurysm (any part), abdominal (CMS-HCC)    ??? ED (erectile dysfunction)    ??? GERD (gastroesophageal reflux disease)    ??? HTN (hypertension)    ??? Hyperlipidemia    ??? Ischemic heart disease    ??? Osteoarthritis of knee        Nutrition Hx: Pt reports low appetite past couple weeks. His wife prepares most of his meals. He feels like he can eat but when sits down to eat not wanting much (usually consumes 1/4-1.2 meals). When he chews his foods it takes forever feels like it is swelling in his mouth and takes while to swallow food bolus.  He does better with soups. He denies any odynophagia or difficulty chewing.  When he eventually swallows food goes down w/o difficulty. He at times has some N/V that is not associated with po, notices it more when he moves a lot. He does confirm 20# weight loss.  He has recently started ensure, taking 1 can daily.  He will take a while to consume as dislikes taste.    Nutritionally Pertinent Meds: NS IV fluids  Labs: Vitamin B12/Folate WNL     Abd/GI: Oral Exam- some missing teeth, no other findings   Skin:   Patient Lines/Drains/Airways Status    Active Wounds     None               Current nutrition therapy order:   Nutrition Orders          Nutrition Therapy Immunosuppressed starting at 12/27 1743           Anthropometric Data:  -- Height: 181.6 cm (5' 11.5)   -- Last recorded weight: 69.4 kg (153 lb)  -- Admission weight: 69.4kg  -- IBW: 79.43 kg  -- Percent IBW: 86%  -- BMI: Body mass index is 21.04  kg/m??.   -- Weight changes this admission:   Last 5 Recorded Weights    11/11/17 2000   Weight: 69.4 kg (153 lb)      -- Weight history PTA: 10% loss since 09/30/17, significant  Wt Readings from Last 10 Encounters:   11/11/17 69.4 kg (153 lb)   11/11/17 70.5 kg (155 lb 6.4 oz)   11/03/17 79 kg (174 lb 1.6 oz)   11/02/17 78.9 kg (174 lb)   11/01/17 80.9 kg (178 lb 4.8 oz)   10/22/17 70.3 kg (155 lb)   10/18/17 76.8 kg (169 lb 6.4 oz)   10/12/17 76.9 kg (169 lb 9.6 oz)   10/04/17 74.3 kg (163 lb 12.8 oz)   09/30/17 77.3 kg (170 lb 6.4 oz)        Daily Estimated Nutrient Needs:   Energy: 0981-1914 kcals 30-35 kcal/kg using admission body weight, 69 kg (11/12/17 0909)]  Protein: 83-104 gm [1.2-1.5 gm/kg using admission body weight, 69 kg (11/12/17 0909)]  Carbohydrate:   [no restriction]  Fluid:   mL [1 mL/kcal (maintenance)]           Nutrition Focused Physical Exam:  Fat Areas Examined  Orbital: Moderate loss  Upper Arm: Moderate loss      Muscle Areas Examined  Temple: Moderate loss  Clavicle: Moderate loss  Dorsal Hand: Moderate loss  Patellar: Moderate loss  Anterior Thigh: Moderate loss  Posterior Calf: Moderate loss              Nutrition Evaluation  Nutrition Designation: Normal weight (BMI 18.50 - 24.99 kg/m2) (11/12/17 0910)                    DIAGNOSIS:  Malnutrition Assessment using AND/ASPEN Clinical Characteristics:    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 Loss: > 5% x 1 month  Fat Loss: Moderate  Muscle Loss: Moderate                                              Overall nutrition impression: Pt will need more guidance in improving oral and supplement intake.  - Malnutrition related to debility/fatigue as evidenced by significant weight loss, muscle/fat loss, anorexia.     GOALS:  Oral Intake:       - Patient to consume >or =50% of most meals per day.  -Patient to consume 75% of all oral supplements  Anthropometric:       - No greater than 1% weight loss in week.     RECOMMENDATIONS AND INTERVENTIONS:  Added Ensure Plus & Super Shake once daily   (Reviewed flavoring tips for home)  --need intake ensure plus or prepared shake high kcal/prot increase BID  Reviewed easy-to-consume foods and have readily available at home  ---attempt smaller frequent snacking instead large prepared meals  ---nutrition supplements can count as part of smaller meals  Continue daily weights      RD Follow Up Parameters:  1-2 times per week (and more frequent as indicated)     Ed Blalock, MS, RD, CSO, LDN  Pager # [phone]

## 2017-11-12 NOTE — Unmapped (Signed)
Medicine Daily Progress Note    Assessment/Plan:  Principal Problem:    Acute myeloid leukemia (AML), M5 (CMS-HCC)  Active Problems:    Coronary atherosclerosis of native coronary artery    Failure to thrive in adult  Resolved Problems:    * No resolved hospital problems. 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 male who presented to [facility] with Acute myeloid leukemia (AML), M5 (CMS-HCC).    Acute Myeloid Leukemia: Diagnosed in October 2018. Flow- 39% blasts- CD34+ (subset), CD13, CD15, CD7, CD11c ,CD33, CD117, HLA-DR. Normal cytogenetics and no molecular mutations assessed. Hyperleukocytosis to 28,000 with 10% relative blasts on diff. Patient reports low grade fevers to 101.1 at home.   - Hydrea 2000 mg BID, goal WBC <10,000  - Allopurinol 300 mg daily  - Start C2D1 Azacitadine on 12/28, remain inpatient for week cycle.  - Follow-up peripheral blood myeloid mutational panel and FLT3 testing  - continue acyclovir prophylaxis  ??  Cough: Patient describes cough productive of clear sputum, intermittently for 1 month. Emphysema noted on prior CT chest at outside facility, but images not in system. CXR in ED negative for acute airspace disease.   - f/u blood cultures  - consider chest CT if symptoms persist  ??  Failure to Thrive, Severe protein calorie malnutrition: Patient reports 20 lb weight loss in approximately 2 weeks. He has poor appetite, frequent nausea, worsened by his medications, and difficulty swallowing. Albumin low at 3.3. Also suspect contribution of his underlying malignancy.   - SLP consult  - Nutrition consult   - IV fluids  ??  Near-falls, gait instability: Patient reports feeling unsteady on his feet. Has not fallen at home but describes pins and needles sensation in the lateral four toes of his left foot. Denies leg weakness. No history of diabetes. Given poor intake will evaluate for vitamin B 12 deficiency.   - Vitamin B 12  - Folate  - PT/OT consult  ??  Dehydration: Patient with modestly elevated BUN to 22 and lactate to 2.3. Mildly tachycardic to 113. All of this improved with IV fluids.  Lactate normalized and patient subjectively feeling better.  - IV Fluids  ??  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 No recent echo available.   - Holding lipitor   ??  GERD:   - Continue home famotidine  ??  Code Status:  Full Code    ___________________________________________________________________    Subjective:  No acute events overnight.  Patient feeling significantly improved.  Feels less weak, less tired even since his admission and getting IV fluids.     Labs/Studies:  Labs and Studies from the last 24hrs per EMR and Reviewed    Pressure Ulcer(s)    Active Pressure Ulcer     None                Objective:  Temp:  [37.1 ??C-38 ??C] 37.2 ??C  Heart Rate:  [87-95] 87  Resp:  [16-18] 16  BP: (110-147)/(55-73) 117/56  SpO2:  [92 %-100 %] 98 %    GEN: NAD, lying in bed, thin male  EYES: EOMI, sclerae and conjunctivae clear  ENT: MMM  CV: RRR, no murmur or rubs  PULM: CTAB, normal work of breathing, on room air  ABD: soft, NT/ND, +BS  EXT: No edema  Neuro: speech is fluent without pauses, oriented x 3, alert, no focal deficits, face is symmetric

## 2017-11-12 NOTE — Unmapped (Signed)
Patient returned from CT Scan  Transported by Radiology  How tranported Stretcher  Cardiac Monitor no

## 2017-11-12 NOTE — Unmapped (Signed)
Report called to [employer] on 4 ONC

## 2017-11-12 NOTE — Unmapped (Signed)
Patient transported to CT Scan  Transported by Radiology  How tranported Stretcher  Cardiac Monitor no

## 2017-11-12 NOTE — Unmapped (Signed)
Speech Language Pathology Clinical Swallow Assessment  Evaluation (11/12/17 0900)    Patient Name:  Arthur Bates       Medical Record Number: [id number]   Date of Birth: 10/26/1946  Sex: Male            SLP Treatment Diagnosis: r/o dysphagia  Activity Tolerance: Patient tolerated treatment well      General:  Current Functional Status: Arthur Bates is a 71 y.o. male with PMHx as noted below (cough, CAD, GERD, FTT, near falls-gait instability) that presents to [facility] with Acute myeloid leukemia (AML), M5 (CMS-HCC). Pt currently cleared for a regular diet and thin liquids. No alternate means of nutrition in place. He reported a 20 lb weight loss in the past 2 weeks, I'm just not interested in eating anything. Endorsed xerostomia and dysgeusia. Denied dysphagia, odynophagia, globus sensation, h/o PNA.     Assessment  Pt seen today for clinical swallowing evalution in the setting of AML, dehydration, and FTT. He currently presents with the appearance of an oropharyngeal swallowing mechanism that is grossly WFL. Oral mech exam unremarkable. No overt clinical s/sx aspiration across all consistencies trialed despite challenging, successive sips. Note prolonged manipulation of solid foods, though appears to be 2/2 disinterest in food rather than oral deficit. No oral residual. Voice remained clear throughout assessment.     Recommend continued regular diet with thin liquids. General aspiration precautions apply: upright with intake, small and single bites/sips, slow rate. Pt will continue to benefit from nutrition services as it appears reduced PO intake is 2/2 reduced appetite and disinterest rather than oropharyngeal swallowing deficit. Encouraged pt to increase PO intake with smaller, more frequent meals/snacks that are higher in caloric content to reduce fatigue. No further ST services appear warranted at this time. Please reconsult should acute changes to swallowing status occur.     Risk for Aspiration: None     Recommendations:         Diet Solids Recommendation: Regular Solids (no restrictions)    Diet Liquids Recommendations: No liquid consistency restrictions    Recommended Form of Medications: Whole;Crushed;With liquid;With puree (As pt prefers and as clinically indicated)      Compensatory Swallowing Strategies: Upright as possible for all oral intake;Alternate solids and liquids;Eat/feed slowly;Small bites/sips    Discharge Recommendations:  SLP services not indicated          Prognosis:  (+): current performance, no difficulty with PO intake prior to recent disinterest in food, no h/o PNA     Plan of Care  SLP Follow-up / Frequency: D/C Services, D/C Services      Patient and Family Goals: Go home    Subjective  Patient/Caregiver Reports: I don't want to eat anything, I don't know why I feel this way  Communication Preference: Verbal    Ace inhibitors; Shellfish containing products; and Zithromax [azithromycin]  Current Facility-Administered Medications   Medication Dose Route Frequency Provider Last Rate Last Dose   ??? acetaminophen (TYLENOL) tablet 650 mg  650 mg Oral Q4H PRN Maura Crandall, MD       ??? allopurinol (ZYLOPRIM) tablet 300 mg  300 mg Oral Daily Doren Custard, MD       ??? emollient combo number 108 (LUBRIDERM-LUBRISKIN) lotion 1 application  1 application Topical Q12H SCH Oluseyi A Fayanju, MD       ??? famotidine (PEPCID) tablet 20 mg  20 mg Oral BID Maura Crandall, MD   20 mg at 11/11/17 2051   ???  fluconazole (DIFLUCAN) tablet 100 mg  100 mg Oral Daily Maura Crandall, MD       ??? hydroxyurea (HYDREA) capsule 2,000 mg  2,000 mg Oral BID Maura Crandall, MD   2,000 mg at 11/11/17 2050   ??? levoFLOXacin (LEVAQUIN) tablet 500 mg  500 mg Oral Q24H SCH Maura Crandall, MD       ??? loperamide (IMODIUM) capsule 2 mg  2 mg Oral Q2H PRN Maura Crandall, MD       ??? loperamide (IMODIUM) capsule 4 mg  4 mg Oral Once PRN Maura Crandall, MD       ??? magic mouthwash oral suspension 10 mL  10 mL Oral Q2H PRN Maura Crandall, MD       ??? ondansetron (ZOFRAN) injection 4 mg  4 mg Intravenous Q6H PRN Doren Custard, MD       ??? ondansetron (ZOFRAN) tablet 8 mg  8 mg Oral Q12H PRN Maura Crandall, MD       ??? polyethylene glycol (MIRALAX) packet 17 g  17 g Oral Daily PRN Maura Crandall, MD       ??? sodium chloride (NS) 0.9 % flush 10 mL  10 mL Intravenous BID Maura Crandall, MD       ??? sodium chloride (NS) 0.9 % flush 10 mL  10 mL Intravenous BID Maura Crandall, MD       ??? sodium chloride (NS) 0.9 % flush 10 mL  10 mL Intravenous BID Maura Crandall, MD   10 mL at 11/11/17 2055   ??? sodium chloride (NS) 0.9 % infusion  75 mL/hr Intravenous Continuous Kai Levins, MD       ??? valACYclovir (VALTREX) tablet 500 mg  500 mg Oral Daily Doren Custard, MD   500 mg at 11/11/17 2059     Past Medical History:   Diagnosis Date   ??? Coronary artery disease    ??? Depression    ??? Dissecting aortic aneurysm (any part), abdominal (CMS-HCC)    ??? ED (erectile dysfunction)    ??? GERD (gastroesophageal reflux disease)    ??? HTN (hypertension)    ??? Hyperlipidemia    ??? Ischemic heart disease    ??? Osteoarthritis of knee      Family History   Problem Relation Age of Onset   ??? Heart disease Mother    ??? Heart disease Father      Past Surgical History:   Procedure Laterality Date   ??? CARDIAC CATHETERIZATION     ??? COLONOSCOPY  2009   ??? CORONARY ANGIOPLASTY WITH STENT PLACEMENT     ??? KNEE SURGERY     ??? TOTAL KNEE ARTHROPLASTY         Pain Comments : 5/10 in stomach/ribs, pt reported RN aware    Precautions: Non-applicable  Required Braces or Orthoses: Non- applicable  Medical Tests / Procedures Comments: CXR 12/27: No acute airspace disease.       Vision: Functional for self-feeding    Objective  Temperature Spikes Noted: No  Respiratory Status : Room air  History of Intubation: No          Behavior/Cognition: Alert;Cooperative;Pleasant mood  Positioning : Upright in bed    Oral / Motor Exam    Vocal Quality: Normal Volitional Swallow: Within Functional Limits   Labial ROM: Within Functional Limits   Labial Symmetry: Within Functional Limits  Labial Strength: Within Functional Limits   Lingual ROM: Within  Functional Limits  Lingual Symmetry: Within Functional Limits  Lingual Strength: Within Functional Limits   Lingual Sensation: Within Functional Limits      Mandible: Within Functional Limits  Coordination: WFL  Facial ROM: Within Functional Limits   Facial Symmetry: Within Functional Limits  Facial Strength: Within Functional Limits  Facial Sensation: Within Functional Limits   Vocal Intensity: Within Functional Limits       Apraxia: None present   Dysarthria: None present   Intelligibility: Intelligible   Breath Support: Adequate for speech   Dentition: Adequate    Consistencies assessed: thin liquids, puree, soft solids, regular solids       Session Duration : 18      I attest that I have reviewed the above information.  Signed: Eliezer Mccoy, SLP    Ceasar Mons 11/12/2017

## 2017-11-12 NOTE — Unmapped (Signed)
Problem: Patient Care Overview  Goal: Plan of Care Review  Outcome: Progressing  Patient alert and oriented, VSS. No acute events overnight, wctm.

## 2017-11-12 NOTE — Unmapped (Signed)
Hematology Oncology History and Physical    Assessment/Plan:    Principal Problem:    Acute myeloid leukemia (AML), M5 (CMS-HCC)  Active Problems:    Coronary atherosclerosis of native coronary artery    Dehydration    Failure to thrive in adult      Arthur Bates is a 71 y.o. male with PMHx as noted below that presents to [facility] with Acute myeloid leukemia (AML), M5 (CMS-HCC).    Acute Myeloid Leukemia: Diagnosed in October 2018. Flow- 39% blasts- CD34+ (subset), CD13, CD15, CD7, CD11c ,CD33, CD117, HLA-DR. Normal cytogenetics and no molecular mutations assessed. Hyperleukocytosis to 28,000 with 10% relative blasts on diff. Patient reports low grade fevers to 101.1 at home.   - Hydrea 2000 mg BID, goal WBC <10,000  - Allopurinol 300 mg daily  - f/u uric acid, LDH  - Anticipate C2D1 Azacitadine = 12/28  - Follow-up peripheral blood myeloid mutational panel and FLT3 testing  - Repeat BMBx after C2  - continue home ppx: levofloxacin, acyclovir, fluconazole    Cough: Patient describes cough productive of clear sputum for 1 month. Emphysema noted on prior CT chest at outside facility, but images not in system. CXR in ED negative for acute airspace disease. Reports intermittent low grade fevers to 100 at home, DDx includes infection vs tumor fever as above.   - f/u blood cultures  - Consider repeat CT chest     Failure to Thrive: Patient reports 20 lb weight loss in approximately 2 weeks. He has poor appetite, frequent nausea, worsened by his medications, and difficulty swallowing. Albumin low at 3.3. Also suspect contribution of his underlying malignancy.   - SLP consult  - Nutrition consult   - IV zofran    Near-falls, gait instability: Patient reports feeling unsteady on his feet. Has not fallen at home but describes pins and needles sensation in the lateral four toes of his left foot. Denies leg weakness. No history of diabetes. Given poor intake will evaluate for vitamin B 12 deficiency.   - Vitamin B 12  - Folate  - PT/OT     Dehydration: Patient with modestly elevated BUN to 22 and lactate to 2.3. Mildly tachycardic to 113. Suspect due to low volume state in context of poor intake as above.   - IV Fluids  - follow up lactate    CAD: History of MI in 2010 with bare metal stent placed. No recent echo available.   - Holding lipitor in anticipation of chemo    GERD:   - Continue home famotidine    Code Status:  Full Code  ___________________________________________________________________    Chief Complaint  Chief Complaint   Patient presents with   ??? Weakness       HPI:  Arthur Bates is a 72 y.o. male with a history CAD w/ prior MI (11/01/09) now s/p bare metal stent (BMS) placement ([facility]), hypertension, prior small bowel obstruction, GERD, renal calculi s/p stone removal (circa early 1980s) and degenerative joint disease s/p bilateral knee replacement (possibly 2012 (L) and 2016 (R) at [place] [place]) admitted 11/11/2017. History is gathered from the patient, his family, and prior notes.     This was his first time meeting Dr. Vertell Limber; he had normally been treated at [facility], receiving cycle 1 of azacitadine on 09/22/2017, but his doctor fell sick and he came to [facility] to start cycle 2 of azacitadine. He reportedly got his first cycle of azacitadine about two months  prior to present admission. The patient endorsed a 20 lb weight loss over two weeks and reported poor energy and low appetite. He also reported nausea and vomiting worsened by his medications along with difficulty swallowing (not necessarily that the food gets stuck, he just can't swallow them). The patient's wife has been trying Boost and Ensure supplements at home. The patient does not wear dentures and has reportedly not endorsed any tooth pain. He reports low energy and spends a lot of time sleeping in his recliner.     No specific infectious etiologies appreciated on exam in the ED. Empiric cultures and labs were collected.    No specific localizing symptoms appreciated. The patient was reportedly told that a recent CT scan at [facility] showed LAD.     Was reportedly told to no longer take allopurinol and hydroxyurea    Allergies:  Ace inhibitors; Shellfish containing products; and Zithromax [azithromycin]    Medications:   Prior to Admission medications    Medication Dose, Route, Frequency   acyclovir (ZOVIRAX) 400 MG tablet 400 mg, Oral, 2 times a day (standard)   allopurinol (ZYLOPRIM) 100 MG tablet 100 mg, Oral, Daily (standard)   atorvastatin (LIPITOR) 40 MG tablet 40 mg, Oral, Daily (standard)   fluconazole (DIFLUCAN) 100 MG tablet 100 mg, Oral, Daily (standard)   hydroxyurea (HYDREA) 500 mg capsule Take 1 tablet once daily   levoFLOXacin (LEVAQUIN) 500 MG tablet 500 mg, Oral, Daily (standard)   ondansetron (ZOFRAN) 8 MG tablet 8 mg, Oral, Every 12 hours PRN   polyethylene glycol (MIRALAX) 17 gram packet MIX 1 PACKET OF POWDER (17 GM) IN 8 OZ OF JUICE OR WATER AND DRINK BY MOUTH DAILY   ranitidine (ZANTAC) 150 MG tablet 150 mg, Oral, 2 times a day (standard)   venetoclax (VENCLEXTA) 100 mg tablet 400 mg, Oral, Daily (standard), Take with a meal and water. Do not chew, crush, or break tablets.       Medical History:  Past Medical History:   Diagnosis Date   ??? Coronary artery disease    ??? Depression    ??? Dissecting aortic aneurysm (any part), abdominal (CMS-HCC)    ??? ED (erectile dysfunction)    ??? GERD (gastroesophageal reflux disease)    ??? HTN (hypertension)    ??? Hyperlipidemia    ??? Ischemic heart disease    ??? Osteoarthritis of knee        Surgical History:  Past Surgical History:   Procedure Laterality Date   ??? CARDIAC CATHETERIZATION     ??? COLONOSCOPY  2009   ??? CORONARY ANGIOPLASTY WITH STENT PLACEMENT     ??? KNEE SURGERY     ??? TOTAL KNEE ARTHROPLASTY         Social History:  Tobacco use:   reports that he quit smoking about 31 years ago. His smoking use included Cigarettes. He has a 50.00 pack-year smoking history. He has never used smokeless tobacco.  Alcohol use:   reports that he does not drink alcohol.  Drug use:  reports that he does not use drugs.  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 Remote smoking history, denied alcohol and drug abuse. Lives in [place], [place] at a home with family including wife and four great-granchildren (ages 2,4,6 and 3); patient also lives with dogs, cats, and chickens.    Family History:  Family History   Problem Relation Age of Onset   ??? Heart disease Mother    ???  Heart disease Father    Paternal cousin with a different kind of leukemia    Review of Systems:  Gen: -sick contacts, +tactile fever (most of them run around 110 - 101, resolve with Tylenol), +weight loss (twenty lbs in the last two weeks), -night sweats, +chills  HEENT: +dysphagia  Pulm: +cough (productive of scant, clear output) x 1 month (worse over the past few days)  Card: -PND, -cardiac chest pain  GI: +nausea, +vomiting (looking kinda yellow), -abdominal pain, -constipation, -diarrhea  GU: -dysuria  Neuro: +gait instability x 1 month (since he lost so much weight and got so week)  Skin: +recent rash (12/17 - 18 on back; reportedly resolved)  Extremities: +numbness on L foot (Toes 2 - 5)  Heme: -bruising, -epistaxis  MSK: +Bilateral lower chest wall pain (worse with coughing)  Psych: +sleeps more than usual, -diminished interest, -guilty feelings, -depression    Physical Exam:  Temp:  [36.4 ??C] 36.4 ??C  Heart Rate:  [97-113] 113  Resp:  [16-18] 16  BP: (131-137)/(66-80) 137/66  SpO2:  [98 %] 98 %  There is no height or weight on file to calculate BMI.    Gen: Thin, frail-appearing man in stretcher with glasses on  HEENT: No oropharyngeal erythema or exudates. Isolated petechia on palate  Pulm: CTAB  Card: RRR, no m/r/g  Abd: Soft, NTND, NABS  Lymph: No cervical or axillary LAD  MSK: No tenderness on compression of chest wall  Neuro: Alert and oriented to person, place, month; thought date was the 26th. Initially responded Bush when asked if it was the president, but corrected to say Arthur Bates. Decreased sensation on 4 lateral toes of left foot  Psych: Mood, affect appropriate    Test Results:  Data Review:    All lab results last 24 hours:    Recent Results (from the past 24 hour(s))   Myeloid Mutation Panel - AML    Collection Time: 11/11/17 12:17 PM   Result Value Ref Range    Collection     Uric acid    Collection Time: 11/11/17 12:17 PM   Result Value Ref Range    Uric Acid 5.0 4.0 - 9.0 mg/dL   Lactate dehydrogenase    Collection Time: 11/11/17 12:17 PM   Result Value Ref Range    LDH 1,532 (H) 338 - 610 U/L   Phosphorus Level    Collection Time: 11/11/17 12:17 PM   Result Value Ref Range    Phosphorus 4.8 (H) 2.9 - 4.7 mg/dL   Comprehensive Metabolic Panel    Collection Time: 11/11/17 12:17 PM   Result Value Ref Range    Sodium 137 135 - 145 mmol/L    Potassium 3.7 3.5 - 5.0 mmol/L    Chloride 98 98 - 107 mmol/L    CO2 28.0 22.0 - 30.0 mmol/L    BUN 19 7 - 21 mg/dL    Creatinine 1.61 0.96 - 1.30 mg/dL    BUN/Creatinine Ratio 18     EGFR MDRD Non Af Amer >=60 >=60 mL/min/1.69m2    EGFR MDRD Af Amer >=60 >=60 mL/min/1.39m2    Anion Gap 11 9 - 15 mmol/L    Glucose 136 65 - 179 mg/dL    Calcium 9.2 8.5 - 04.5 mg/dL    Albumin 3.5 3.5 - 5.0 g/dL    Total Protein 6.7 6.5 - 8.3 g/dL    Total Bilirubin 1.0 0.0 - 1.2 mg/dL    AST 69 (H) 19 - 55 U/L  ALT 60 19 - 72 U/L    Alkaline Phosphatase 261 (H) 38 - 126 U/L   Type and Screen    Collection Time: 11/11/17 12:17 PM   Result Value Ref Range    Check Type Needed Type Check     Antibody Screen NEG     ABO Grouping Invalid     Blood Type B POS    CBC w/ Differential    Collection Time: 11/11/17 12:17 PM   Result Value Ref Range    WBC 34.8 (H) 4.5 - 11.0 10*9/L    RBC 2.78 (L) 4.50 - 5.90 10*12/L    HGB 8.6 (L) 13.5 - 17.5 g/dL    HCT 16.1 (L) 09.6 - 53.0 %    MCV 90.9 80.0 - 100.0 fL    MCH 30.9 26.0 - 34.0 pg    MCHC 34.0 31.0 - 37.0 g/dL    RDW 04.5 (H) 40.9 - 15.0 %    MPV 10.6 (H) 7.0 - 10.0 fL    Platelet 175 150 - 440 10*9/L   Pathologist Smear Review    Collection Time: 11/11/17 12:17 PM   Result Value Ref Range    Pathologist Smear Interpretation Confirmed by Hemepath Specialist/Senior Tech    Manual Differential    Collection Time: 11/11/17 12:17 PM   Result Value Ref Range    Blasts % 12 (HH) <=0 %    Absolute Neutrophils 7.7 (H) 2.0 - 7.5 10*9/L    Absolute Lymphocytes 3.8 1.5 - 5.0 10*9/L    Absolute Monocytes 18.8 (H) 0.2 - 0.8 10*9/L    Absolute Eosinophils 0.3 0.0 - 0.4 10*9/L    Absolute Basophils 0.0 0.0 - 0.1 10*9/L    Smear Review Comments See Comment (A) Undefined   Comprehensive Metabolic Panel    Collection Time: 11/11/17  3:22 PM   Result Value Ref Range    Sodium 136 135 - 145 mmol/L    Potassium 4.1 3.5 - 5.0 mmol/L    Chloride 96 (L) 98 - 107 mmol/L    CO2 28.0 22.0 - 30.0 mmol/L    BUN 22 (H) 7 - 21 mg/dL    Creatinine 8.11 9.14 - 1.30 mg/dL    BUN/Creatinine Ratio 22     EGFR MDRD Non Af Amer >=60 >=60 mL/min/1.15m2    EGFR MDRD Af Amer >=60 >=60 mL/min/1.28m2    Anion Gap 12 9 - 15 mmol/L    Glucose 167 65 - 179 mg/dL    Calcium 9.0 8.5 - 78.2 mg/dL    Albumin 3.3 (L) 3.5 - 5.0 g/dL    Total Protein 6.2 (L) 6.5 - 8.3 g/dL    Total Bilirubin 0.8 0.0 - 1.2 mg/dL    AST 68 (H) 19 - 55 U/L    ALT 57 19 - 72 U/L    Alkaline Phosphatase 252 (H) 38 - 126 U/L   CBC w/ Differential    Collection Time: 11/11/17  3:22 PM   Result Value Ref Range    WBC 28.2 (H) 4.5 - 11.0 10*9/L    RBC 2.74 (L) 4.50 - 5.90 10*12/L    HGB 8.4 (L) 13.5 - 17.5 g/dL    HCT 95.6 (L) 21.3 - 53.0 %    MCV 91.6 80.0 - 100.0 fL    MCH 30.8 26.0 - 34.0 pg    MCHC 33.6 31.0 - 37.0 g/dL    RDW 08.6 (H) 57.8 - 15.0 %    MPV 11.3 (H) 7.0 -  10.0 fL    Platelet 163 150 - 440 10*9/L   Manual Differential    Collection Time: 11/11/17  3:22 PM   Result Value Ref Range    Blasts % 10 (HH) <=0 %    Absolute Neutrophils 5.4 2.0 - 7.5 10*9/L    Absolute Lymphocytes 3.9 1.5 - 5.0 10*9/L    Absolute Monocytes 14.9 (H) 0.2 - 0.8 10*9/L    Absolute Eosinophils 0.6 (H) 0.0 - 0.4 10*9/L    Absolute Basophils 0.6 (H) 0.0 - 0.1 10*9/L    Smear Review Comments See Comment (A) Undefined   Blood Gas, Venous    Collection Time: 11/11/17  4:00 PM   Result Value Ref Range    Specimen Source Venous     FIO2 Venous Room Air     pH, Venous 7.34 7.32 - 7.43    pCO2, Ven 52 40 - 60 mm Hg    pO2, Ven 23 (L) 30 - 55 mm Hg    HCO3, Ven 27 22 - 27 mmol/L    Base Excess, Ven 1.7 -2.0 - 2.0    O2 Saturation, Venous 28.6 (L) 40.0 - 85.0 %   Lactic Acid, Venous, Whole Blood    Collection Time: 11/11/17  4:00 PM   Result Value Ref Range    Lactate, Venous 2.3 (H) 0.5 - 1.8 mmol/L   ECG 12 Lead    Collection Time: 11/11/17  4:18 PM   Result Value Ref Range    EKG Systolic BP  mmHg    EKG Diastolic BP  mmHg    EKG Ventricular Rate 90 BPM    EKG Atrial Rate 90 BPM    EKG P-R Interval 166 ms    EKG QRS Duration 78 ms    EKG Q-T Interval 390 ms    EKG QTC Calculation 477 ms    EKG Calculated P Axis 53 degrees    EKG Calculated R Axis -11 degrees    EKG Calculated T Axis 17 degrees       Imaging: Radiology studies were personally reviewed    EKG: normal sinus rhythm.

## 2017-11-13 LAB — COMPREHENSIVE METABOLIC PANEL
ALBUMIN: 2.6 g/dL — ABNORMAL LOW (ref 3.5–5.0)
ALKALINE PHOSPHATASE: 207 U/L — ABNORMAL HIGH (ref 38–126)
ALT (SGPT): 45 U/L (ref 19–72)
ANION GAP: 8 mmol/L — ABNORMAL LOW (ref 9–15)
AST (SGOT): 47 U/L (ref 19–55)
BILIRUBIN TOTAL: 1.3 mg/dL — ABNORMAL HIGH (ref 0.0–1.2)
BLOOD UREA NITROGEN: 21 mg/dL (ref 7–21)
BUN / CREAT RATIO: 21
CALCIUM: 8.3 mg/dL — ABNORMAL LOW (ref 8.5–10.2)
CHLORIDE: 104 mmol/L (ref 98–107)
CO2: 26 mmol/L (ref 22.0–30.0)
CREATININE: 1 mg/dL (ref 0.70–1.30)
EGFR MDRD AF AMER: >=60 mL/min/{1.73_m2} (ref >=60)
EGFR MDRD NON AF AMER: >=60 mL/min/{1.73_m2} (ref >=60)
GLUCOSE RANDOM: 99 mg/dL (ref 65–179)
POTASSIUM: 4.1 mmol/L (ref 3.5–5.0)
PROTEIN TOTAL: 5.5 g/dL — ABNORMAL LOW (ref 6.5–8.3)

## 2017-11-13 LAB — MAGNESIUM
Magnesium:MCnc:Pt:Ser/Plas:Qn:: 1.9
Magnesium:MCnc:Pt:Ser/Plas:Qn:: 2

## 2017-11-13 LAB — PHOSPHORUS
Phosphate:MCnc:Pt:Ser/Plas:Qn:: 5 — ABNORMAL HIGH
Phosphate:MCnc:Pt:Ser/Plas:Qn:: 5.2 — ABNORMAL HIGH

## 2017-11-13 LAB — MANUAL DIFFERENTIAL
BASOPHILS - ABS (DIFF): 0.2 10*9/L — ABNORMAL HIGH (ref 0.0–0.1)
EOSINOPHILS - ABS (DIFF): 0.2 10*9/L (ref 0.0–0.4)
LYMPHOCYTES - ABS (DIFF): 3.3 10*9/L (ref 1.5–5.0)
MONOCYTES - ABS (DIFF): 13.1 10*9/L — ABNORMAL HIGH (ref 0.2–0.8)
NEUTROPHILS - ABS (DIFF): 2.8 10*9/L (ref 2.0–7.5)

## 2017-11-13 LAB — URIC ACID
URIC ACID: 4.9 mg/dL (ref 4.0–9.0)
Urate:MCnc:Pt:Ser/Plas:Qn:: 4.9
Urate:MCnc:Pt:Ser/Plas:Qn:: 5.4

## 2017-11-13 LAB — CBC W/ AUTO DIFF
HEMOGLOBIN: 8.2 g/dL — ABNORMAL LOW (ref 13.5–17.5)
MEAN CORPUSCULAR HEMOGLOBIN CONC: 34.2 g/dL (ref 31.0–37.0)
MEAN CORPUSCULAR HEMOGLOBIN: 30.8 pg (ref 26.0–34.0)
MEAN CORPUSCULAR VOLUME: 90.1 fL (ref 80.0–100.0)
MEAN PLATELET VOLUME: 11 fL — ABNORMAL HIGH (ref 7.0–10.0)
NUCLEATED RED BLOOD CELLS: 2 /100{WBCs} (ref <=4)
PLATELET COUNT: 127 10*9/L — ABNORMAL LOW (ref 150–440)
RED BLOOD CELL COUNT: 2.67 10*12/L — ABNORMAL LOW (ref 4.50–5.90)
RED CELL DISTRIBUTION WIDTH: 15.4 % — ABNORMAL HIGH (ref 12.0–15.0)

## 2017-11-13 LAB — BILIRUBIN TOTAL: Bilirubin:MCnc:Pt:Ser/Plas:Qn:: 1.3 — ABNORMAL HIGH

## 2017-11-13 LAB — EOSINOPHILS - ABS (DIFF): Lab: 0.2

## 2017-11-13 LAB — LACTATE DEHYDROGENASE
Lactate dehydrogenase:CCnc:Pt:Ser/Plas:Qn:: 1028 — ABNORMAL HIGH
Lactate dehydrogenase:CCnc:Pt:Ser/Plas:Qn:: 1194 — ABNORMAL HIGH

## 2017-11-13 LAB — POTASSIUM: Potassium:SCnc:Pt:Ser/Plas:Qn:: 4

## 2017-11-13 LAB — WBC ADJUSTED: Lab: 21.9 — ABNORMAL HIGH

## 2017-11-13 NOTE — Unmapped (Signed)
Care Management  Initial Transition Planning Assessment              General  Care Manager assessed the patient by : Telephone conversation with family, Medical record review, Discussion with Clinical Care team  Orientation Level: Oriented X4  Who provides care at home?: Family member    Contact/Decision Maker:    Contact Details  Contact Details: Primary Contact  Primary Contact Name: Jenell Milliner  Primary Contact Relationship: Spouse  Phone #1: [phone]    Advance Directive (Medical Treatment)  Does patient have an advance directive covering medical treatment?: Patient does not have advance directive covering medical treatment.  Reason patient does not have an advance directive covering medical treatment:: Patient does not wish to complete one at this time  Surrogate decision maker appointed:: [facility] only) Other authorized decision-maker (Comment on relationship to patient): wife  Surrogate decision maker's name:: Jenell Milliner  Surrogate decision maker's phone number:: [phone]  Information provided on advance directive:: No  Patient requests assistance:: No    Advance Directive (Mental Health Treatment)  Does patient have an advance directive covering mental health treatment?: Patient does not have advance directive covering mental health treatment.  Reason patient does not have an advance directive covering mental health treatment:: Patient does not wish to complete one at this time.    Patient Information:    Lives with: Spouse/significant other, Family members    Type of Residence: Private residence     [address]  [place] [address]   [phone] (wife's cell)             Support Systems: Spouse, Family Members, Friends/Neighbors    Responsibilities/Dependents at home?: Yes (Describe) (Patient has custody of 2 of his four great grandchildren, ages 9, 3, 20, and 80)         Medical Provider(s): Richardean Chimera, MD  Reason for Admission: Admitting Diagnosis:  No admission diagnoses are documented for this encounter.  Past Medical History:   has a past medical history of Coronary artery disease; Depression; Dissecting aortic aneurysm (any part), abdominal (CMS-HCC); ED (erectile dysfunction); GERD (gastroesophageal reflux disease); HTN (hypertension); Hyperlipidemia; Ischemic heart disease; and Osteoarthritis of knee.  Past Surgical History:   has a past surgical history that includes Cardiac catheterization; Coronary angioplasty with stent; Colonoscopy (2009); Knee surgery; and Total knee arthroplasty.   Previous admit date: N/A    Editor, commissioning- Payor: Advertising copywriter MEDICARE ADV / Plan: Advertising copywriter MEDICARE ADV / Product Type: *No Product type* /   Secondary Insurance ??? None  Prescription Coverage ??? Yes  Preferred Pharmacy - [facility] [place] - [address]  [facility] - [place], [place] - [address]    Transportation home: Private vehicle  Level of function prior to admission: Requires Assistance        Home Care services in place prior to admission?: No                  Equipment Currently Used at Home: cane, straight, walker, rolling, shower chair       Currently receiving outpatient dialysis?: No       Financial Information:     Patient source of income: Retired    Need for financial assistance?: No       Discharge Needs Assessment:    Concerns to be Addressed: no discharge needs identified    Clinical Risk Factors: > 65, Principal  Diagnosis: Cancer, Stroke, COPD, Heart Failure, AMI, Pneumonia, Joint Replacment, Functional Limitations, Visual or Hearing Impaired    Barriers to taking medications: No    Prior overnight hospital stay or ED visit in last 90 days: No    Readmission Within the Last 30 Days: no previous admission in last 30 days         Anticipated Changes Related to Illness: none    Equipment Needed After Discharge: none    Discharge Facility/Level of Care Needs:  N/A    Patient at risk for readmission?: No Discharge Plan: Home or Self Care vs Home with [facility]    Screen findings are: Care Manager reviewed the plan of the patient's care with the Multidisciplinary Team. No discharge planning needs identified at this time. Care Manager will continue to manage plan and monitor patient's progress with the team.    Expected Discharge Date: 11/18/17    Expected Transfer from Critical Care:  (N/A)    Patient and/or family were provided with choice of facilities / services that are available and appropriate to meet post hospital care needs?: Yes   List choices in order highest to lowest preferred, if applicable. : wife would like to use [facility] company patient used in past but will need to find name of company    Initial Assessment complete?: Yes

## 2017-11-14 LAB — POTASSIUM: Potassium:SCnc:Pt:Ser/Plas:Qn:: 3.9

## 2017-11-14 LAB — MANUAL DIFFERENTIAL
BASOPHILS - ABS (DIFF): 0.7 10*9/L — ABNORMAL HIGH (ref 0.0–0.1)
BLASTS - REL (DIFF): 11 % (ref <=0)
EOSINOPHILS - ABS (DIFF): 0.2 10*9/L (ref 0.0–0.4)
MONOCYTES - ABS (DIFF): 4 10*9/L — ABNORMAL HIGH (ref 0.2–0.8)
NEUTROPHILS - ABS (DIFF): 1.2 10*9/L — ABNORMAL LOW (ref 2.0–7.5)

## 2017-11-14 LAB — COMPREHENSIVE METABOLIC PANEL
ALBUMIN: 2.4 g/dL — ABNORMAL LOW (ref 3.5–5.0)
ALT (SGPT): 42 U/L (ref 19–72)
ANION GAP: 6 mmol/L — ABNORMAL LOW (ref 9–15)
AST (SGOT): 46 U/L (ref 19–55)
BILIRUBIN TOTAL: 1.2 mg/dL (ref 0.0–1.2)
BUN / CREAT RATIO: 27
CALCIUM: 8.2 mg/dL — ABNORMAL LOW (ref 8.5–10.2)
CHLORIDE: 107 mmol/L (ref 98–107)
CO2: 25 mmol/L (ref 22.0–30.0)
CREATININE: 0.88 mg/dL (ref 0.70–1.30)
EGFR MDRD AF AMER: >=60 mL/min/{1.73_m2} (ref >=60)
EGFR MDRD NON AF AMER: >=60 mL/min/{1.73_m2} (ref >=60)
GLUCOSE RANDOM: 102 mg/dL (ref 65–179)
POTASSIUM: 4 mmol/L (ref 3.5–5.0)
PROTEIN TOTAL: 5.1 g/dL — ABNORMAL LOW (ref 6.5–8.3)
SODIUM: 138 mmol/L (ref 135–145)

## 2017-11-14 LAB — CBC W/ AUTO DIFF
HEMATOCRIT: 22 % — ABNORMAL LOW (ref 41.0–53.0)
HEMOGLOBIN: 7.5 g/dL — ABNORMAL LOW (ref 13.5–17.5)
MEAN CORPUSCULAR HEMOGLOBIN CONC: 34.1 g/dL (ref 31.0–37.0)
MEAN CORPUSCULAR HEMOGLOBIN: 30.8 pg (ref 26.0–34.0)
MEAN PLATELET VOLUME: 10.5 fL — ABNORMAL HIGH (ref 7.0–10.0)
NUCLEATED RED BLOOD CELLS: 3 /100{WBCs} (ref <=4)
PLATELET COUNT: 97 10*9/L — ABNORMAL LOW (ref 150–440)
RED BLOOD CELL COUNT: 2.44 10*12/L — ABNORMAL LOW (ref 4.50–5.90)
WBC ADJUSTED: 10.1 10*9/L (ref 4.5–11.0)

## 2017-11-14 LAB — MEAN PLATELET VOLUME: Lab: 10.5 — ABNORMAL HIGH

## 2017-11-14 LAB — URIC ACID
Urate:MCnc:Pt:Ser/Plas:Qn:: 4
Urate:MCnc:Pt:Ser/Plas:Qn:: 4.7

## 2017-11-14 LAB — PHOSPHORUS
PHOSPHORUS: 4.5 mg/dL (ref 2.9–4.7)
Phosphate:MCnc:Pt:Ser/Plas:Qn:: 4.5
Phosphate:MCnc:Pt:Ser/Plas:Qn:: 5 — ABNORMAL HIGH

## 2017-11-14 LAB — LACTATE DEHYDROGENASE
Lactate dehydrogenase:CCnc:Pt:Ser/Plas:Qn:: 1023 — ABNORMAL HIGH
Lactate dehydrogenase:CCnc:Pt:Ser/Plas:Qn:: 761 — ABNORMAL HIGH

## 2017-11-14 LAB — MAGNESIUM
Magnesium:MCnc:Pt:Ser/Plas:Qn:: 1.9
Magnesium:MCnc:Pt:Ser/Plas:Qn:: 2

## 2017-11-14 LAB — EGFR MDRD AF AMER: Glomerular filtration rate/1.73 sq M.predicted.black:ArVRat:Pt:Ser/Plas/Bld:Qn:Creatinine-based formula (MDRD): >=60

## 2017-11-14 LAB — EOSINOPHILS - ABS (DIFF): Lab: 0.2

## 2017-11-14 NOTE — Unmapped (Signed)
Medicine Daily Progress Note    Assessment/Plan:  Principal Problem:    Acute myeloid leukemia (AML), M5 (CMS-HCC)  Active Problems:    Coronary atherosclerosis of native coronary artery    Failure to thrive in adult  Resolved Problems:    * No resolved hospital problems. 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 male who presented to [facility] with Acute myeloid leukemia (AML), M5 (CMS-HCC).    Acute Myeloid Leukemia: Diagnosed in October 2018. Flow- 39% blasts- CD34+ (subset), CD13, CD15, CD7, CD11c ,CD33, CD117, HLA-DR. Normal cytogenetics and no molecular mutations assessed. Hyperleukocytosis to 28,000 with 10% relative blasts on diff. Patient reports low grade fevers to 101.1 at home.   - Hydrea 2000 mg BID, goal WBC <10,000  - Allopurinol 300 mg daily  - Start Cycle 2 Azacitadine on 12/28, remain inpatient for week cycle.  - Follow-up peripheral blood myeloid mutational panel and FLT3 testing  - continue acyclovir prophylaxis  ??  Cough: Patient describes cough productive of clear sputum, intermittently for 1 month. Emphysema noted on prior CT chest at outside facility, but images not in system. CXR in ED negative for acute airspace disease.   - f/u blood cultures  - consider chest CT if symptoms persist, symptoms stable  ??  Failure to Thrive, Severe protein calorie malnutrition: Patient reports 20 lb weight loss in approximately 2 weeks. He has poor appetite, frequent nausea, worsened by his medications, and difficulty swallowing. Albumin low at 3.3. Also suspect contribution of his underlying malignancy.   - SLP consult  - Nutrition consult   - IV fluids  ??  Near-falls, gait instability: Patient reports feeling unsteady on his feet. Has not fallen at home but describes pins and needles sensation in the lateral four toes of his left foot. Denies leg weakness. No history of diabetes. Given poor intake will evaluate for vitamin B 12 deficiency.   - PT/OT consult, recommend 3x weekly  ??  Dehydration, Resolved: Patient with modestly elevated BUN to 22 and lactate to 2.3. Mildly tachycardic to 113. All of this improved with IV fluids.  Lactate normalized and patient subjectively feeling better.  - IV Fluids  ??  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 No recent echo available.   - Holding lipitor   ??  GERD:  Continue home famotidine  ??  Code Status:  Full Code  ___________________________________________________________________    Subjective:  No acute events overnight.  Still with poor PO intake.  Likes and tolerates ensures and liquids, but just not hungry, no appetite for solid food.  Still feels generally weak.  No cough today.  Speech, occupational, and physical therapy have all seen the patient.     Labs/Studies:  Labs and Studies from the last 24hrs per EMR and Reviewed    Pressure Ulcer(s)    Active Pressure Ulcer     None                Objective:  Temp:  [36.6 ??C-38 ??C] 36.6 ??C  Heart Rate:  [76-99] 86  Resp:  [16-18] 18  BP: (114-153)/(56-68) 126/59  SpO2:  [86 %-100 %] 100 %    GEN: NAD, lying in bed, thin male  EYES: EOMI, sclerae and conjunctivae clear  ENT: MMM  CV: RRR, no murmur or rubs  PULM:  CTAB, normal work of breathing, on room air  ABD: soft, NT/ND, +BS  EXT: No edema  Neuro: speech is fluent without pauses, oriented x 3, alert, no focal deficits, face is symmetric

## 2017-11-14 NOTE — Unmapped (Signed)
Medicine Daily Progress Note    Assessment/Plan:  Principal Problem:    Acute myeloid leukemia (AML), M5 (CMS-HCC)  Active Problems:    Coronary atherosclerosis of native coronary artery    Failure to thrive in adult  Resolved Problems:    * No resolved hospital problems. 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 male who presented to [facility] with Acute myeloid leukemia (AML), M5 (CMS-HCC).    Acute Myeloid Leukemia: Diagnosed in October 2018. Flow- 39% blasts- CD34+ (subset), CD13, CD15, CD7, CD11c ,CD33, CD117, HLA-DR. Normal cytogenetics and no molecular mutations assessed. Hyperleukocytosis to 28,000 with 10% relative blasts on diff. Patient reported low grade fevers at home.  - Will discontinue Hydrea today  - Allopurinol 300 mg daily  - Started Cycle 2 Azacitadine on 12/28, remain inpatient for week cycle.  - Follow-up peripheral blood myeloid mutational panel and FLT3 testing  - continue acyclovir prophylaxis  ??  Cough: Patient describes cough productive of clear sputum, intermittently for 1 month. Emphysema noted on prior CT chest at outside facility, but images not in system. CXR in ED negative for acute airspace disease.  Reports cough has actually been improving.  - consider chest CT if symptoms persist (symptoms stable to improved)    Fever: Fever on 12/29 PM.  Does have cough, although it is improved, negative CXR.  No other focal infectious symptoms.  Likely related to malignancy. Hemodynamically stable.  -- Blood cultures from 12/27 NG 48 hours  -- Blood and urine cultures from 12/30 pending  -- Monitor off antibiotics.  ??  Failure to Thrive, Severe protein calorie malnutrition: Patient reports 20 lb weight loss in approximately 2 weeks. He has poor appetite, worsened by his medications, and difficulty swallowing. Denies any n/v.  - SLP consulted and have seen patient  - Nutrition consult   - Ensures  - Will start Remeron 7.5 mg qhs.  ??  Near-falls, gait instability: Patient reports feeling unsteady on his feet. Has not fallen at home but describes pins and needles sensation in the lateral four toes of his left foot. Denies leg weakness. No history of diabetes. Given poor intake will evaluate for vitamin B 12 deficiency.   - PT/OT consult, recommend 3x weekly  ??  Dehydration, Resolved: Patient with modestly elevated BUN to 22 and lactate to 2.3. Mildly tachycardic to 113. All of this improved with IV fluids.  Lactate normalized and patient subjectively feeling better.  - s/p IV Fluids  ??  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 No recent echo available.   - Holding lipitor   ??  GERD:  Continue home famotidine  ??  Code Status:  Full Code  ___________________________________________________________________    Subjective:  Was febrile overnight, still generally feeling tired/weak.  Tolerating liquids but still no appetite.  Reports the cough he has had for awhile, prior to coming to the hospital, has been getting better.  No longer productive and less frequent.  No complaints this morning other than general fatigue.      Labs/Studies:  Labs and Studies from the last 24hrs per EMR and Reviewed    Pressure Ulcer(s)    Active Pressure Ulcer     None                Objective:  Temp:  [36.6 ??  C-38.9 ??C] 36.6 ??C  Heart Rate:  [80-103] 80  Resp:  [18-20] 18  BP: (114-156)/(47-74) 134/47  SpO2:  [93 %-100 %] 98 %    GEN: NAD, lying in bed, thin male  EYES: EOMI, sclerae and conjunctivae clear  ENT: MMM  CV: RRR, no murmur or rubs  PULM: CTAB, normal work of breathing, on room air  ABD: soft, NT/ND, +BS  EXT: No edema  Neuro: speech is fluent without pauses, oriented x 3, alert, no focal deficits, face is symmetric

## 2017-11-14 NOTE — Unmapped (Signed)
Problem: Patient Care Overview  Goal: Plan of Care Review  Outcome: Progressing  Patient alert and oriented, Tmax 38.9, tylenol given. All other vitals stable. WCTM.

## 2017-11-14 NOTE — Unmapped (Signed)
Problem: Patient Care Overview  Goal: Plan of Care Review  Outcome: Progressing  Pt is afebrile. His appetite is poor. He had a BM today. Reports he does not normally have a BM every day. Miralax not given. Pt was up in the chair today. He reports weakness when ambulating to the bathroom with assistance. Falls precautions reinforced.     11/13/17 1745   OTHER   Plan of Care Reviewed With patient   Plan of Care Review   Progress no change       Problem: Fall Risk (Adult)  Goal: Identify Related Risk Factors and Signs and Symptoms  Related risk factors and signs and symptoms are identified upon initiation of Human Response Clinical Practice Guideline (CPG).   Outcome: Not Progressing   11/13/17 1745   Fall Risk (Adult)   Related Risk Factors (Fall Risk) other (see comments)  (weakness)   Fall precautions maintained. Pt encouraged to call for assistance. Pt without falls or injuries. Denied being dizzy, but reported being weak.

## 2017-11-15 DIAGNOSIS — C9200 Acute myeloblastic leukemia, not having achieved remission: Principal | ICD-10-CM

## 2017-11-15 LAB — LACTATE DEHYDROGENASE
LACTATE DEHYDROGENASE: 802 U/L — ABNORMAL HIGH (ref 338–610)
Lactate dehydrogenase:CCnc:Pt:Ser/Plas:Qn:: 746 — ABNORMAL HIGH
Lactate dehydrogenase:CCnc:Pt:Ser/Plas:Qn:: 802 — ABNORMAL HIGH

## 2017-11-15 LAB — CBC W/ AUTO DIFF
HEMATOCRIT: 21.2 % — ABNORMAL LOW (ref 41.0–53.0)
MEAN CORPUSCULAR HEMOGLOBIN CONC: 34.1 g/dL (ref 31.0–37.0)
MEAN CORPUSCULAR HEMOGLOBIN: 31.4 pg (ref 26.0–34.0)
MEAN CORPUSCULAR VOLUME: 92.1 fL (ref 80.0–100.0)
MEAN PLATELET VOLUME: 10.5 fL — ABNORMAL HIGH (ref 7.0–10.0)
PLATELET COUNT: 85 10*9/L — ABNORMAL LOW (ref 150–440)
RED CELL DISTRIBUTION WIDTH: 15.1 % — ABNORMAL HIGH (ref 12.0–15.0)
WBC ADJUSTED: 5 10*9/L (ref 4.5–11.0)

## 2017-11-15 LAB — MAGNESIUM
Magnesium:MCnc:Pt:Ser/Plas:Qn:: 2
Magnesium:MCnc:Pt:Ser/Plas:Qn:: 2

## 2017-11-15 LAB — COMPREHENSIVE METABOLIC PANEL
ALBUMIN: 2.3 g/dL — ABNORMAL LOW (ref 3.5–5.0)
ALKALINE PHOSPHATASE: 193 U/L — ABNORMAL HIGH (ref 38–126)
ALT (SGPT): 46 U/L (ref 19–72)
ANION GAP: 8 mmol/L — ABNORMAL LOW (ref 9–15)
AST (SGOT): 38 U/L (ref 19–55)
BILIRUBIN TOTAL: 1.4 mg/dL — ABNORMAL HIGH (ref 0.0–1.2)
CALCIUM: 8.3 mg/dL — ABNORMAL LOW (ref 8.5–10.2)
CHLORIDE: 103 mmol/L (ref 98–107)
CO2: 26 mmol/L (ref 22.0–30.0)
CREATININE: 0.83 mg/dL (ref 0.70–1.30)
EGFR MDRD AF AMER: >=60 mL/min/{1.73_m2} (ref >=60)
EGFR MDRD NON AF AMER: >=60 mL/min/{1.73_m2} (ref >=60)
GLUCOSE RANDOM: 98 mg/dL (ref 65–179)
POTASSIUM: 4 mmol/L (ref 3.5–5.0)
PROTEIN TOTAL: 4.9 g/dL — ABNORMAL LOW (ref 6.5–8.3)
SODIUM: 137 mmol/L (ref 135–145)

## 2017-11-15 LAB — MANUAL DIFFERENTIAL
BASOPHILS - ABS (DIFF): 0.3 10*9/L — ABNORMAL HIGH (ref 0.0–0.1)
BLASTS - REL (DIFF): 6 % (ref <=0)
EOSINOPHILS - ABS (DIFF): 0.3 10*9/L (ref 0.0–0.4)
LYMPHOCYTES - ABS (DIFF): 1.5 10*9/L (ref 1.5–5.0)
MONOCYTES - ABS (DIFF): 2.2 10*9/L — ABNORMAL HIGH (ref 0.2–0.8)
NEUTROPHILS - ABS (DIFF): 0.5 10*9/L — ABNORMAL LOW (ref 2.0–7.5)

## 2017-11-15 LAB — PHOSPHORUS
Phosphate:MCnc:Pt:Ser/Plas:Qn:: 4.4
Phosphate:MCnc:Pt:Ser/Plas:Qn:: 4.5

## 2017-11-15 LAB — URIC ACID
Urate:MCnc:Pt:Ser/Plas:Qn:: 3.6 — ABNORMAL LOW
Urate:MCnc:Pt:Ser/Plas:Qn:: 4.1

## 2017-11-15 LAB — SODIUM: Sodium:SCnc:Pt:Ser/Plas:Qn:: 137

## 2017-11-15 LAB — HEMOGLOBIN: Lab: 7.2 — ABNORMAL LOW

## 2017-11-15 LAB — SMEAR REVIEW

## 2017-11-15 LAB — POTASSIUM: Potassium:SCnc:Pt:Ser/Plas:Qn:: 4

## 2017-11-15 NOTE — Unmapped (Signed)
Medicine Daily Progress Note    Interval Events/Subjective: Afebrile again overnight.  ANC this morning down now to 0.5.  Reports last night cough, which had been improving, seemed worse.  Cefepime started.  CT Chest to investigate cough and fever.  He did have a CXR on admission that was negative.     Assessment/Plan:  Principal Problem:    Acute myeloid leukemia (AML), M5 (CMS-HCC)  Active Problems:    Coronary atherosclerosis of native coronary artery    Failure to thrive in adult  Resolved Problems:    * No resolved hospital problems. 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 male who presented to [facility] with Acute myeloid leukemia (AML), M5 (CMS-HCC).    Acute Myeloid Leukemia: Diagnosed in October 2018. Flow- 39% blasts- CD34+ (subset), CD13, CD15, CD7, CD11c ,CD33, CD117, HLA-DR. Normal cytogenetics and no molecular mutations assessed. Hyperleukocytosis to 28,000 with 10% relative blasts on diff. Patient reported low grade fevers at home.  -- Allopurinol 300 mg daily  -- Started Cycle 2 Azacitadine on 12/28, remain inpatient for week cycle.  -- Follow-up peripheral blood myeloid mutational panel and FLT3 testing  -- Continue acyclovir prophylaxis  ??  Cough: Patient describes cough productive of clear sputum, intermittently for 1 month. Emphysema noted on prior CT chest at outside facility, but images not in system. CXR in ED negative for acute airspace disease.  On 12/30 PM reports cough worsened  -- Given worsening of cough will order CT Chest w/o    Fever: Fever on 12/29 PM.  HDS, but now cough has worsened.  ANC has also dropped and is now 0.5.  -- Blood cultures from 12/27 NGTD  -- Blood and urine cultures 12/30 NG 24 hrs  -- IV Cefepime 12/31-  ??  Failure to Thrive, Severe protein calorie malnutrition: Patient reports 20 lb weight loss in approximately 2 weeks. He has poor appetite, worsened by his medications, and difficulty swallowing. Denies any n/v.  - SLP consulted and have seen patient  - Nutrition consult   - Ensures  - Remeron 7.5 mg qhs.  ??  Near-falls, gait instability: Patient reports feeling unsteady on his feet. Has not fallen at home but describes pins and needles sensation in the lateral four toes of his left foot. Denies leg weakness. No history of diabetes. Given poor intake will evaluate for vitamin B 12 deficiency.   - PT/OT consult, recommend 3x weekly  ??  Dehydration, Resolved: Patient with modestly elevated BUN to 22 and lactate to 2.3. Mildly tachycardic to 113. All of this improved with IV fluids.  Lactate normalized and patient subjectively feeling better.  ??  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 No recent echo available.   - Holding lipitor   ??  GERD:  Continue home famotidine  ??  Code Status:  Full Code  ___________________________________________________________________    Labs/Studies:  Labs and Studies from the last 24hrs per EMR and Reviewed    Pressure Ulcer(s)    Active Pressure Ulcer     None                Objective:  Temp:  [36.8 ??C-38.2 ??C] 36.8 ??C  Heart Rate:  [89-102] 89  Resp:  [16-20] 19  BP: (127-143)/(56-70) 132/65  SpO2:  [96 %-98 %] 97 %  GEN: NAD, lying in bed, thin male  EYES: EOMI, sclerae and conjunctivae clear  ENT: MMM  CV: RRR, no murmur or rubs  PULM: CTAB, normal work of breathing, on room air  ABD: soft, NT/ND, +BS  EXT: No edema  Neuro: speech is fluent without pauses, oriented x 3, alert, no focal deficits, face is symmetric

## 2017-11-15 NOTE — Unmapped (Signed)
Problem: Patient Care Overview  Goal: Plan of Care Review  Outcome: Progressing   11/15/17 0426   OTHER   Plan of Care Reviewed With patient   Plan of Care Review   Progress no change     Pt is alert and oriented. Pt febrile at 38.2, all other VSS. MD notified of fever, no new orders placed. Prn Tylenol not given (order states to give >38.3, confirmed w/MD). Fever resolved on its own, last temperature 36.9. Pt had no complaints of pain throughout the shift. Pt free from falls and injuries. Will continue to monitor pt status.    Problem: Fall Risk (Adult)  Goal: Identify Related Risk Factors and Signs and Symptoms  Related risk factors and signs and symptoms are identified upon initiation of Human Response Clinical Practice Guideline (CPG).   Outcome: Progressing   11/15/17 0426   Fall Risk (Adult)   Related Risk Factors (Fall Risk) fatigue/slow reaction;environment unfamiliar;other (see comments)  (generalized weakness)   Signs and Symptoms (Fall Risk) presence of risk factors     Goal: Absence of Fall  Patient will demonstrate the desired outcomes by discharge/transition of care.   Outcome: Progressing   11/15/17 0426   Fall Risk (Adult)   Absence of Fall making progress toward outcome

## 2017-11-15 NOTE — Unmapped (Signed)
Problem: Patient Care Overview  Goal: Plan of Care Review  Outcome: Progressing  Patient AOx4, VSS, afebrile. Patient was Cycle 2, Day 3 at 1500 today for his chemo.  Patient pleasant though no appetite.  No falls or acute events this shift. Safety measures in place of bed low with brakes locked, non-skid footwear on while out of bed, call bell within reach.  Will CTM.      Problem: Fall Risk (Adult)  Goal: Absence of Fall  Patient will demonstrate the desired outcomes by discharge/transition of care.   Outcome: Progressing

## 2017-11-16 DIAGNOSIS — C9200 Acute myeloblastic leukemia, not having achieved remission: Principal | ICD-10-CM

## 2017-11-16 LAB — BILIRUBIN DIRECT: Bilirubin.glucuronidated:MCnc:Pt:Ser/Plas:Qn:: 0.9 — ABNORMAL HIGH

## 2017-11-16 LAB — COMPREHENSIVE METABOLIC PANEL
ALBUMIN: 2.6 g/dL — ABNORMAL LOW (ref 3.5–5.0)
ALKALINE PHOSPHATASE: 204 U/L — ABNORMAL HIGH (ref 38–126)
ALT (SGPT): 40 U/L (ref 19–72)
ANION GAP: 7 mmol/L — ABNORMAL LOW (ref 9–15)
AST (SGOT): 36 U/L (ref 19–55)
BILIRUBIN TOTAL: 2.4 mg/dL — ABNORMAL HIGH (ref 0.0–1.2)
BLOOD UREA NITROGEN: 22 mg/dL — ABNORMAL HIGH (ref 7–21)
BUN / CREAT RATIO: 27
CHLORIDE: 99 mmol/L (ref 98–107)
CO2: 25 mmol/L (ref 22.0–30.0)
CREATININE: 0.83 mg/dL (ref 0.70–1.30)
EGFR MDRD AF AMER: >=60 mL/min/{1.73_m2} (ref >=60)
EGFR MDRD NON AF AMER: >=60 mL/min/{1.73_m2} (ref >=60)
GLUCOSE RANDOM: 107 mg/dL (ref 65–179)
POTASSIUM: 4 mmol/L (ref 3.5–5.0)
PROTEIN TOTAL: 5.3 g/dL — ABNORMAL LOW (ref 6.5–8.3)
SODIUM: 131 mmol/L — ABNORMAL LOW (ref 135–145)

## 2017-11-16 LAB — CBC W/ AUTO DIFF
HEMATOCRIT: 21 % — ABNORMAL LOW (ref 41.0–53.0)
HEMOGLOBIN: 7.3 g/dL — ABNORMAL LOW (ref 13.5–17.5)
MEAN CORPUSCULAR HEMOGLOBIN CONC: 34.5 g/dL (ref 31.0–37.0)
MEAN CORPUSCULAR VOLUME: 90.4 fL (ref 80.0–100.0)
MEAN PLATELET VOLUME: 11.8 fL — ABNORMAL HIGH (ref 7.0–10.0)
PLATELET COUNT: 66 10*9/L — ABNORMAL LOW (ref 150–440)
RED BLOOD CELL COUNT: 2.33 10*12/L — ABNORMAL LOW (ref 4.50–5.90)
RED CELL DISTRIBUTION WIDTH: 14.6 % (ref 12.0–15.0)
WBC ADJUSTED: 4.4 10*9/L — ABNORMAL LOW (ref 4.5–11.0)

## 2017-11-16 LAB — MANUAL DIFFERENTIAL
BASOPHILS - ABS (DIFF): 0 10*9/L (ref 0.0–0.1)
EOSINOPHILS - ABS (DIFF): 0.1 10*9/L (ref 0.0–0.4)
LYMPHOCYTES - ABS (DIFF): 0.6 10*9/L — ABNORMAL LOW (ref 1.5–5.0)
MONOCYTES - ABS (DIFF): 2.7 10*9/L — ABNORMAL HIGH (ref 0.2–0.8)

## 2017-11-16 LAB — LACTATE DEHYDROGENASE
Lactate dehydrogenase:CCnc:Pt:Ser/Plas:Qn:: 700 — ABNORMAL HIGH
Lactate dehydrogenase:CCnc:Pt:Ser/Plas:Qn:: 706 — ABNORMAL HIGH

## 2017-11-16 LAB — MAGNESIUM
Magnesium:MCnc:Pt:Ser/Plas:Qn:: 1.8
Magnesium:MCnc:Pt:Ser/Plas:Qn:: 2

## 2017-11-16 LAB — HAPTOGLOBIN: Haptoglobin:MCnc:Pt:Ser/Plas:Qn:: 385 — ABNORMAL HIGH

## 2017-11-16 LAB — CREATININE: Creatinine:MCnc:Pt:Ser/Plas:Qn:: 0.83

## 2017-11-16 LAB — WBC ADJUSTED: Lab: 4.4 — ABNORMAL LOW

## 2017-11-16 LAB — URIC ACID
Urate:MCnc:Pt:Ser/Plas:Qn:: 3.1 — ABNORMAL LOW
Urate:MCnc:Pt:Ser/Plas:Qn:: 3.5 — ABNORMAL LOW

## 2017-11-16 LAB — PHOSPHORUS
Phosphate:MCnc:Pt:Ser/Plas:Qn:: 4.3
Phosphate:MCnc:Pt:Ser/Plas:Qn:: 4.3

## 2017-11-16 LAB — LYMPHOCYTES - ABS (DIFF): Lab: 0.6 — ABNORMAL LOW

## 2017-11-16 LAB — POTASSIUM: Potassium:SCnc:Pt:Ser/Plas:Qn:: 4.4

## 2017-11-16 NOTE — Unmapped (Signed)
Medicine Daily Progress Note    Interval Events/Subjective: Afebrile again overnight.  Still fatigued feeling.  Drinks liquids but still with no appetite.  Remains on IV Cefepime.  CT Chest completed yesterday with bibasilar subpleural opacities, likely fibrosis. No consolidations or discrete nodules to suggest infection.      Assessment/Plan:  Principal Problem:    Acute myeloid leukemia (AML), M5 (CMS-HCC)  Active Problems:    Coronary atherosclerosis of native coronary artery    Failure to thrive in adult  Resolved Problems:    * No resolved hospital problems. *   Malnutrition Evaluation as performed by RD, LDN: Severe Protein-Calorie Malnutrition in the context of chronic illness (11/12/17 0910)       Arthur Bates is a 72 y.o. male who presented to [facility] with Acute myeloid leukemia (AML), M5 (CMS-HCC).    Acute Myeloid Leukemia: Diagnosed in October 2018. Flow- 39% blasts- CD34+ (subset), CD13, CD15, CD7, CD11c ,CD33, CD117, HLA-DR. Normal cytogenetics and no molecular mutations assessed. Hyperleukocytosis to 28,000 with 10% relative blasts on diff. Patient reported low grade fevers at home.  -- Allopurinol 300 mg daily  -- Started Cycle 2 Azacitadine on 12/28, remain inpatient for week cycle.  -- Follow-up peripheral blood myeloid mutational panel  -- Continue acyclovir prophylaxis  ??  Cough: Patient describes cough productive of clear sputum, intermittently for 1 month. Emphysema noted on prior CT chest at outside facility, but images not in system. CXR in ED negative for acute airspace disease.  On 12/30 PM reported cough worsened.  CT chest w/o on 11/15/2018 showed bibasilar subpleural opacities, likely fibrosis. No consolidations or discrete nodules to suggest infection.    Fever: HDS, but now cough has worsened.  ANC has also dropped and is now 0.5.  -- Blood cultures from 12/27 NGTD  -- Blood and urine cultures 12/30 NG 48 hrs  -- IV Cefepime 12/31-    Elevated Bilirubin: total bilirubin elevated on 1/1 to 2.4, was previously 1.2-1.4.  Haptoglobin and LDH not consistent with hemolysis.  No abdominal pain, no n/v.  Pharmacy did not feel it was related to any of his meds or chemo.  -- Will start with abdominal ultrasound to investigate  ??  Failure to Thrive, Severe protein calorie malnutrition: Patient reports 20 lb weight loss in approximately 2 weeks. He has poor appetite, worsened by his medications, and difficulty swallowing. Denies any n/v.  - SLP consulted and have seen patient  - Nutrition consult   - Ensures  - Remeron 7.5 mg qhs.  ??  Near-falls, gait instability: Patient reports feeling unsteady on his feet. Has not fallen at home but describes pins and needles sensation in the lateral four toes of his left foot. Denies leg weakness. No history of diabetes. Given poor intake will evaluate for vitamin B 12 deficiency.   - PT/OT consult, recommend 3x weekly  ??  Dehydration, Resolved: Patient with modestly elevated BUN to 22 and lactate to 2.3. Mildly tachycardic to 113. All of this improved with IV fluids.  Lactate normalized and patient subjectively feeling better.  ??  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 No recent echo available.   - Holding lipitor   ??  GERD:  Continue home famotidine  ??  Code Status:  Full Code  ___________________________________________________________________    Labs/Studies:  Labs and Studies from the last 24hrs per EMR and Reviewed    Pressure Ulcer(s)    Active Pressure Ulcer  None                Objective:  Temp:  [37 ??C-38.4 ??C] 37.4 ??C  Heart Rate:  [93-106] 100  Resp:  [18-24] 18  BP: (126-149)/(62-74) 141/63  SpO2:  [95 %-98 %] 98 %    GEN: NAD, lying in bed, thin male  EYES: EOMI, sclerae and conjunctivae clear  ENT: MMM  CV: RRR, no murmur or rubs  PULM: CTAB, normal work of breathing, on room air  ABD: soft, NT/ND, +BS  EXT: No edema  Neuro: speech is fluent without pauses, oriented x 3, alert, no focal deficits, face is symmetric

## 2017-11-16 NOTE — Unmapped (Signed)
Problem: Patient Care Overview  Goal: Plan of Care Review  Outcome: Progressing   11/16/17 0609   OTHER   Plan of Care Reviewed With patient   Plan of Care Review   Progress no change       Problem: Fall Risk (Adult)  Goal: Identify Related Risk Factors and Signs and Symptoms  Related risk factors and signs and symptoms are identified upon initiation of Human Response Clinical Practice Guideline (CPG).   Outcome: Progressing   11/15/17 0426   Fall Risk (Adult)   Related Risk Factors (Fall Risk) fatigue/slow reaction;environment unfamiliar;other (see comments)  (generalized weakness)   Signs and Symptoms (Fall Risk) presence of risk factors       Problem: VTE, DVT and PE (Adult)  Goal: Signs and Symptoms of Listed Potential Problems Will be Absent, Minimized or Managed (VTE, DVT and PE)  Signs and symptoms of listed potential problems will be absent, minimized or managed by discharge/transition of care (reference VTE, DVT and PE (Adult) CPG).   Outcome: Progressing   11/16/17 0609   VTE, DVT and PE (Adult)   Problems Assessed (VTE, DVT, PE) all   Problems Present (VTE, DVT, PE) none       Comments: Patient is alert and oriented. Febrile 38.4 tmax. Had nausea/vomiting, gave PRN med. Cough was causing some SOB and pain in chest. Gave prn meds and it gave him some relief and allowed him to rest throughout the night. Protective precautions maintained.

## 2017-11-16 NOTE — Unmapped (Signed)
Problem: Patient Care Overview  Goal: Plan of Care Review  Outcome: Progressing  Patient AOx4, VSS, afebrile. Patient requested to be able to be out of the room for a while in a wheelchair.  RN wheeled patient around unit for a little while.  Patient had CT without contrast this shift.  Patient rested comfortably this shift.  Upon removing dressing from previous chemo site, reddened area noted.  MD aware and visualized.  Will monitor for changes.  No falls or acute events this shift. Safety measures in place of bed low with brakes locked, non-skid footwear on while out of bed, call bell within reach.  Will CTM.      Problem: Fall Risk (Adult)  Goal: Absence of Fall  Patient will demonstrate the desired outcomes by discharge/transition of care.   Outcome: Progressing

## 2017-11-17 LAB — COMPREHENSIVE METABOLIC PANEL
ALBUMIN: 2.6 g/dL — ABNORMAL LOW (ref 3.5–5.0)
ALKALINE PHOSPHATASE: 201 U/L — ABNORMAL HIGH (ref 38–126)
ALT (SGPT): 46 U/L (ref 19–72)
ANION GAP: 6 mmol/L — ABNORMAL LOW (ref 9–15)
AST (SGOT): 39 U/L (ref 19–55)
BILIRUBIN TOTAL: 1.3 mg/dL — ABNORMAL HIGH (ref 0.0–1.2)
BLOOD UREA NITROGEN: 25 mg/dL — ABNORMAL HIGH (ref 7–21)
BUN / CREAT RATIO: 27
CALCIUM: 8.7 mg/dL (ref 8.5–10.2)
CHLORIDE: 101 mmol/L (ref 98–107)
CO2: 28 mmol/L (ref 22.0–30.0)
CREATININE: 0.92 mg/dL (ref 0.70–1.30)
GLUCOSE RANDOM: 104 mg/dL (ref 65–179)
POTASSIUM: 4.3 mmol/L (ref 3.5–5.0)
PROTEIN TOTAL: 5.3 g/dL — ABNORMAL LOW (ref 6.5–8.3)
SODIUM: 135 mmol/L (ref 135–145)

## 2017-11-17 LAB — CBC W/ AUTO DIFF
HEMATOCRIT: 20.4 % — ABNORMAL LOW (ref 41.0–53.0)
HEMOGLOBIN: 6.9 g/dL — ABNORMAL LOW (ref 13.5–17.5)
MEAN CORPUSCULAR HEMOGLOBIN: 30.9 pg (ref 26.0–34.0)
MEAN CORPUSCULAR VOLUME: 90.7 fL (ref 80.0–100.0)
MEAN PLATELET VOLUME: 12.4 fL — ABNORMAL HIGH (ref 7.0–10.0)
NUCLEATED RED BLOOD CELLS: 1 /100{WBCs} (ref <=4)
RED BLOOD CELL COUNT: 2.25 10*12/L — ABNORMAL LOW (ref 4.50–5.90)
WBC ADJUSTED: 4.3 10*9/L — ABNORMAL LOW (ref 4.5–11.0)

## 2017-11-17 LAB — URIC ACID
Urate:MCnc:Pt:Ser/Plas:Qn:: 3.1 — ABNORMAL LOW
Urate:MCnc:Pt:Ser/Plas:Qn:: 3.4 — ABNORMAL LOW

## 2017-11-17 LAB — LYMPHOCYTES - ABS (DIFF): Lab: 0.4 — ABNORMAL LOW

## 2017-11-17 LAB — LACTATE DEHYDROGENASE
Lactate dehydrogenase:CCnc:Pt:Ser/Plas:Qn:: 692 — ABNORMAL HIGH
Lactate dehydrogenase:CCnc:Pt:Ser/Plas:Qn:: 750 — ABNORMAL HIGH

## 2017-11-17 LAB — PHOSPHORUS
Phosphate:MCnc:Pt:Ser/Plas:Qn:: 4
Phosphate:MCnc:Pt:Ser/Plas:Qn:: 4.1

## 2017-11-17 LAB — PLATELET COUNT: Lab: 58 — ABNORMAL LOW

## 2017-11-17 LAB — POTASSIUM: Potassium:SCnc:Pt:Ser/Plas:Qn:: 3.9

## 2017-11-17 LAB — MANUAL DIFFERENTIAL
EOSINOPHILS - ABS (DIFF): 0 10*9/L (ref 0.0–0.4)
MONOCYTES - ABS (DIFF): 2.8 10*9/L — ABNORMAL HIGH (ref 0.2–0.8)
NEUTROPHILS - ABS (DIFF): 0.3 10*9/L — CL (ref 2.0–7.5)

## 2017-11-17 LAB — MAGNESIUM
Magnesium:MCnc:Pt:Ser/Plas:Qn:: 2.1
Magnesium:MCnc:Pt:Ser/Plas:Qn:: 2.1

## 2017-11-17 LAB — BUN / CREAT RATIO: Urea nitrogen/Creatinine:MRto:Pt:Ser/Plas:Qn:: 27

## 2017-11-17 MED FILL — VENCLEXTA/100MG/TABS: VENCLEXTA/100MG/TABS | 30 days supply | Qty: 120 | Fill #0

## 2017-11-17 NOTE — Unmapped (Signed)
Medicine Daily Progress Note    Interval Events/Subjective: Afebrile overnight. Last fever 12/31.  On Cefepime IV currently.  Was more tired/sleepy yesterday.  Says he is feeling better this morning.  Still with poor PO intake, feels like his mouth is dry this morning.  Encouraged out of bed today.  Encouraged ensure (which he likes and tolerates well).  Will give mIVF x 10 hours today.  Day 6 today of Aza.      Assessment/Plan:  Principal Problem:    Acute myeloid leukemia (AML), M5 (CMS-HCC)  Active Problems:    Coronary atherosclerosis of native coronary artery    Failure to thrive in adult  Resolved Problems:    * No resolved hospital problems. *   Malnutrition Evaluation as performed by RD, LDN: Severe Protein-Calorie Malnutrition in the context of chronic illness (11/12/17 0910)       Arthur Bates is a 72 y.o. male who presented to [facility] with Acute myeloid leukemia (AML), M5 (CMS-HCC).    Acute Myeloid Leukemia: Diagnosed in October 2018. Flow- 39% blasts- CD34+ (subset), CD13, CD15, CD7, CD11c ,CD33, CD117, HLA-DR. Normal cytogenetics and no molecular mutations assessed. Hyperleukocytosis to 28,000 with 10% relative blasts on diff. Patient reported low grade fevers at home.  -- Allopurinol 300 mg daily  -- Started Cycle 2 Azacitadine on 12/28, remain inpatient for week cycle.  -- Continue acyclovir prophylaxis  ??  Cough: Patient describes cough productive of clear sputum, intermittently for 1 month. Emphysema noted on prior CT chest at outside facility, but images not in system. CXR in ED negative for acute airspace disease.  On 12/30 PM reported cough worsened.  CT chest w/o on 11/15/2018 showed bibasilar subpleural opacities, likely fibrosis. No consolidations or discrete nodules to suggest infection.    Fever: HDS, but now cough has worsened.  ANC has also dropped and is now 0.5.  -- Blood cultures from 12/27 NGTD  -- Blood and urine cultures 12/30 NG 48 hrs  -- IV Cefepime 12/31-    Elevated Bilirubin: total bilirubin elevated on 1/1 to 2.4, was previously 1.2-1.4.  Haptoglobin and LDH not consistent with hemolysis.  No abdominal pain, no n/v.  Pharmacy did not feel it was related to any of his meds or chemo.  ??  Failure to Thrive, Severe protein calorie malnutrition: Patient reports 20 lb weight loss in approximately 2 weeks. He has poor appetite, worsened by his medications, and difficulty swallowing. Denies any n/v.  - SLP consulted and have seen patient  - Nutrition consult   - Encourage Ensures  - Remeron 7.5 mg qhs.  - NS x 10 hours today at 100 ml/hr  ??  Near-falls, gait instability: Patient reports feeling unsteady on his feet. Has not fallen at home but describes pins and needles sensation in the lateral four toes of his left foot. Denies leg weakness. No history of diabetes. Given poor intake will evaluate for vitamin B 12 deficiency.   - PT/OT consult, recommend 3x weekly  - Request PT/OT sees patient again today  ??  Dehydration, Resolved: Patient with modestly elevated BUN to 22 and lactate to 2.3. Mildly tachycardic to 113. All of this improved with IV fluids.  Lactate normalized and patient subjectively feeling better.  ??  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 No recent echo available.   - Holding lipitor   ??  GERD:  Continue home famotidine  ??  Code Status:  Full Code  ___________________________________________________________________  Labs/Studies:  Labs and Studies from the last 24hrs per EMR and Reviewed    Pressure Ulcer(s)    Active Pressure Ulcer     None                Objective:  Temp:  [36.7 ??C-37.4 ??C] 36.7 ??C  Heart Rate:  [93-113] 94  Resp:  [16-18] 16  BP: (118-147)/(64-72) 125/68  SpO2:  [95 %-100 %] 99 %    GEN: NAD, lying in bed, thin male  EYES: EOMI, sclerae and conjunctivae clear  ENT: MMM  CV: RRR, no murmur or rubs  PULM: CTAB, normal work of breathing, on room air  ABD: soft, NT/ND, +BS  EXT: No edema  Neuro: speech is fluent without pauses, oriented x 3, alert, no focal deficits, face is symmetric

## 2017-11-17 NOTE — Unmapped (Signed)
Problem: Patient Care Overview  Goal: Plan of Care Review  Outcome: Progressing      Problem: Fall Risk (Adult)  Goal: Identify Related Risk Factors and Signs and Symptoms  Related risk factors and signs and symptoms are identified upon initiation of Human Response Clinical Practice Guideline (CPG).   Outcome: Progressing    Goal: Absence of Fall  Patient will demonstrate the desired outcomes by discharge/transition of care.   Outcome: Progressing      Problem: VTE, DVT and PE (Adult)  Goal: Signs and Symptoms of Listed Potential Problems Will be Absent, Minimized or Managed (VTE, DVT and PE)  Signs and symptoms of listed potential problems will be absent, minimized or managed by discharge/transition of care (reference VTE, DVT and PE (Adult) CPG).   Outcome: Progressing      Problem: Oncology Care (Adult)  Goal: Signs and Symptoms of Listed Potential Problems Will be Absent, Minimized or Managed (Oncology Care)  Signs and symptoms of listed potential problems will be absent, minimized or managed by discharge/transition of care (reference Oncology Care (Adult) CPG).   Outcome: Progressing      Comments: Pt very drowsy in the evening. More alert in the early am. VSS. IV abx. Encouraging the pt to reposition. Needs encouragement to drink liquids.

## 2017-11-17 NOTE — Unmapped (Signed)
Problem: Patient Care Overview  Goal: Plan of Care Review  Outcome: Progressing  Patient AOx4, VSS, afebrile. Patient to Ultrasound in afternoon.  Family in to visit, patient remains somewhat drowsy at times, sleeping on and off all shift.  Patient received SubQ Azacitadine today; Cycle 2, Day 5.  Patient tolerated well, redness from dressing noted yesterday on left side improving. Appetite continues to be very poor.  No falls or acute events this shift. Safety measures in place of bed low with brakes locked, non-skid footwear on while out of bed, call bell within reach.  Will CTM.      Problem: Fall Risk (Adult)  Goal: Absence of Fall  Patient will demonstrate the desired outcomes by discharge/transition of care.   Outcome: Progressing

## 2017-11-17 NOTE — Unmapped (Signed)
Patient has been approved for financial assistance for Venclexta from 10/16/17 to 10/15/18

## 2017-11-18 LAB — COMPREHENSIVE METABOLIC PANEL
ALBUMIN: 2.6 g/dL — ABNORMAL LOW (ref 3.5–5.0)
ALKALINE PHOSPHATASE: 178 U/L — ABNORMAL HIGH (ref 38–126)
ALT (SGPT): 41 U/L (ref 19–72)
ANION GAP: 7 mmol/L — ABNORMAL LOW (ref 9–15)
AST (SGOT): 40 U/L (ref 19–55)
BILIRUBIN TOTAL: 1 mg/dL (ref 0.0–1.2)
BLOOD UREA NITROGEN: 26 mg/dL — ABNORMAL HIGH (ref 7–21)
BUN / CREAT RATIO: 31
CALCIUM: 8.4 mg/dL — ABNORMAL LOW (ref 8.5–10.2)
CHLORIDE: 103 mmol/L (ref 98–107)
CO2: 27 mmol/L (ref 22.0–30.0)
CREATININE: 0.84 mg/dL (ref 0.70–1.30)
EGFR MDRD AF AMER: >=60 mL/min/{1.73_m2} (ref >=60)
EGFR MDRD NON AF AMER: >=60 mL/min/{1.73_m2} (ref >=60)
GLUCOSE RANDOM: 106 mg/dL (ref 65–179)
POTASSIUM: 3.8 mmol/L (ref 3.5–5.0)
PROTEIN TOTAL: 5.4 g/dL — ABNORMAL LOW (ref 6.5–8.3)

## 2017-11-18 LAB — CBC W/ AUTO DIFF
HEMATOCRIT: 19.2 % — ABNORMAL LOW (ref 41.0–53.0)
HEMOGLOBIN: 6.5 g/dL — ABNORMAL LOW (ref 13.5–17.5)
MEAN CORPUSCULAR HEMOGLOBIN CONC: 33.8 g/dL (ref 31.0–37.0)
MEAN CORPUSCULAR HEMOGLOBIN: 30.8 pg (ref 26.0–34.0)
MEAN PLATELET VOLUME: 11.5 fL — ABNORMAL HIGH (ref 7.0–10.0)
NUCLEATED RED BLOOD CELLS: 1 /100{WBCs} (ref <=4)
PLATELET COUNT: 66 10*9/L — ABNORMAL LOW (ref 150–440)
RED CELL DISTRIBUTION WIDTH: 14.4 % (ref 12.0–15.0)
WBC ADJUSTED: 2.9 10*9/L — ABNORMAL LOW (ref 4.5–11.0)

## 2017-11-18 LAB — MANUAL DIFFERENTIAL
BASOPHILS - ABS (DIFF): 0.2 10*9/L — ABNORMAL HIGH (ref 0.0–0.1)
BLASTS - REL (DIFF): 8 % (ref <=0)
EOSINOPHILS - ABS (DIFF): 0.1 10*9/L (ref 0.0–0.4)
MONOCYTES - ABS (DIFF): 1.1 10*9/L — ABNORMAL HIGH (ref 0.2–0.8)
NEUTROPHILS - ABS (DIFF): 0.2 10*9/L — CL (ref 2.0–7.5)

## 2017-11-18 LAB — CALCIUM: Calcium:MCnc:Pt:Ser/Plas:Qn:: 8.4 — ABNORMAL LOW

## 2017-11-18 LAB — MEAN CORPUSCULAR VOLUME: Lab: 91.1

## 2017-11-18 LAB — MAGNESIUM
Magnesium:MCnc:Pt:Ser/Plas:Qn:: 2.1
Magnesium:MCnc:Pt:Ser/Plas:Qn:: 2.2

## 2017-11-18 LAB — URIC ACID
Urate:MCnc:Pt:Ser/Plas:Qn:: 3.2 — ABNORMAL LOW
Urate:MCnc:Pt:Ser/Plas:Qn:: 3.3 — ABNORMAL LOW

## 2017-11-18 LAB — POTASSIUM: Potassium:SCnc:Pt:Ser/Plas:Qn:: 4.1

## 2017-11-18 LAB — PHOSPHORUS
Phosphate:MCnc:Pt:Ser/Plas:Qn:: 4
Phosphate:MCnc:Pt:Ser/Plas:Qn:: 4.4

## 2017-11-18 LAB — LACTATE DEHYDROGENASE
Lactate dehydrogenase:CCnc:Pt:Ser/Plas:Qn:: 612 — ABNORMAL HIGH
Lactate dehydrogenase:CCnc:Pt:Ser/Plas:Qn:: 649 — ABNORMAL HIGH

## 2017-11-18 LAB — SMEAR REVIEW

## 2017-11-18 NOTE — Unmapped (Signed)
Problem: Patient Care Overview  Goal: Plan of Care Review  Outcome: Progressing  Pt A&OX4 with intermittent forgetfulness. No acute events this shift. VSS, afebrile. See flowsheet for assessment.  Day 6 of Vidaza administered and decitabine started. Very minimal food intake today. Family stated pt only drank one ensure and had one bite of a burger. Pt went off the unit with family today. Slept most of the rest of the evening. No falls or injuries    Problem: Fall Risk (Adult)  Goal: Identify Related Risk Factors and Signs and Symptoms  Related risk factors and signs and symptoms are identified upon initiation of Human Response Clinical Practice Guideline (CPG).   Outcome: Progressing    Goal: Absence of Fall  Patient will demonstrate the desired outcomes by discharge/transition of care.   Outcome: Progressing      Problem: VTE, DVT and PE (Adult)  Goal: Signs and Symptoms of Listed Potential Problems Will be Absent, Minimized or Managed (VTE, DVT and PE)  Signs and symptoms of listed potential problems will be absent, minimized or managed by discharge/transition of care (reference VTE, DVT and PE (Adult) CPG).   Outcome: Progressing

## 2017-11-18 NOTE — Unmapped (Signed)
Medicine Daily Progress Note    Interval Events/Subjective: Afebrile overnight. Last fever 12/31.  On Cefepime IV currently.  Very tired and fatigued during rounds today.  Still with poor PO intake.  Encouraged out of bed today.  Encouraged ensure (which he likes and tolerates well).  Will give mIVF x 10 hours today.  Day 7 today of Aza.  PT/OT consult today.     Assessment/Plan:  Principal Problem:    Acute myeloid leukemia (AML), M5 (CMS-HCC)  Active Problems:    Coronary atherosclerosis of native coronary artery    Failure to thrive in adult  Resolved Problems:    * No resolved hospital problems. *   Malnutrition Evaluation as performed by RD, LDN: Severe Protein-Calorie Malnutrition in the context of chronic illness (11/12/17 0910)     Arthur Bates is a 72 y.o. male who presented to [facility] with Acute myeloid leukemia (AML), M5 (CMS-HCC).    Acute Myeloid Leukemia: Diagnosed in October 2018. Flow- 39% blasts- CD34+ (subset), CD13, CD15, CD7, CD11c ,CD33, CD117, HLA-DR. Normal cytogenetics and no molecular mutations assessed. Hyperleukocytosis to 28,000 with 10% relative blasts on diff. Patient reported low grade fevers at home.  -- Allopurinol 300 mg daily  -- Started Cycle 2 Azacitadine on 12/28, remain inpatient for week cycle, finished today, 1/3.   -- Started Venetoclax 100 mg on 1/2, 200 mg 1/3, 400 mg 1/4  -- Continue acyclovir prophylaxis  -- Consider starting antifungal ppx   -- Tumor lysis labs   ??  Cough: Patient describes cough productive of clear sputum, intermittently for 1 month. Emphysema noted on prior CT chest at outside facility, but images not in system. CXR in ED negative for acute airspace disease.  On 12/30 PM reported cough worsened.  CT chest w/o on 11/15/2018 showed bibasilar subpleural opacities, likely fibrosis. No consolidations or discrete nodules to suggest infection.    Fever: HDS, but now cough has worsened.  ANC has also dropped and is now 0.2  -- Blood cultures from 12/27 NGTD -- Blood and urine cultures 12/30 NG @ 4 days  -- IV Cefepime (12/31- )    Elevated Bilirubin: total bilirubin elevated on 1/1 to 2.4, was previously 1.2-1.4.  Haptoglobin and LDH not consistent with hemolysis.  No abdominal pain, no n/v.  Pharmacy did not feel it was related to any of his meds or chemo. Abdominal ultrasound negative.   ??  Failure to Thrive, Severe protein calorie malnutrition: Patient reports 20 lb weight loss in approximately 2 weeks. He has poor appetite, worsened by his medications, and difficulty swallowing. Denies any n/v.  - SLP consulted and have seen patient  - Nutrition consult   - Encourage Ensures  - Remeron 7.5 mg qhs.  - NS x 10 hours today at 100 ml/hr  ??  Near-falls, gait instability: Patient reports feeling unsteady on his feet. Has not fallen at home but describes pins and needles sensation in the lateral four toes of his left foot. Denies leg weakness. No history of diabetes. Given poor intake will evaluate for vitamin B 12 deficiency.   - PT/OT consult, recommend 3x weekly  - Request PT/OT sees patient again today  ??  Dehydration, Resolved: Patient with modestly elevated BUN to 22 and lactate to 2.3. Mildly tachycardic to 113. All of this improved with IV fluids.  Lactate normalized and patient subjectively feeling better.  ??  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 No recent echo available.   -  Holding lipitor   ??  GERD:  Continue home famotidine  ??  Code Status:  Full Code  ___________________________________________________________________    Labs/Studies:  Labs and Studies from the last 24hrs per EMR and Reviewed    Pressure Ulcer(s)    Active Pressure Ulcer     None              Objective:  Temp:  [36.4 ??C-37.4 ??C] 36.6 ??C  Heart Rate:  [79-114] 87  Resp:  [18-24] 20  BP: (105-132)/(56-67) 105/56  SpO2:  [94 %-100 %] 94 %    GEN: NAD, lying in bed, thin male, somnolent  EYES: EOMI, sclerae and conjunctivae clear  ENT: MMM  CV: RRR, no murmur or rubs  PULM: CTAB, normal work of breathing, on room air  ABD: soft, NT/ND, +BS  EXT: No edema  Neuro: no focal deficits, face is symmetric

## 2017-11-18 NOTE — Unmapped (Signed)
Problem: Patient Care Overview  Goal: Plan of Care Review  Afebrile. HR 114 at midnight check.   Pt slept much of the shift. Confused conversation at times but has been easily redirectable and calm, cooperative.  Episode of vomiting early during shift w/ yellow emesis.  OTO order of IV zofran given.  Poor appetite continues.  Pt weak, using urinal @ BS.  Will continue to monitor.

## 2017-11-19 ENCOUNTER — Encounter
Admit: 2017-11-19 | Discharge: 2017-11-19 | Payer: PRIVATE HEALTH INSURANCE | Attending: Hematology & Oncology | Primary: Hematology & Oncology

## 2017-11-19 DIAGNOSIS — C9200 Acute myeloblastic leukemia, not having achieved remission: Secondary | ICD-10-CM | POA: Diagnosis not present

## 2017-11-19 LAB — COMPREHENSIVE METABOLIC PANEL
ALBUMIN: 2.7 g/dL — ABNORMAL LOW (ref 3.5–5.0)
ALKALINE PHOSPHATASE: 160 U/L — ABNORMAL HIGH (ref 38–126)
ALT (SGPT): 38 U/L (ref 19–72)
ANION GAP: 7 mmol/L — ABNORMAL LOW (ref 9–15)
AST (SGOT): 27 U/L (ref 19–55)
BILIRUBIN TOTAL: 1.2 mg/dL (ref 0.0–1.2)
BUN / CREAT RATIO: 41
CALCIUM: 8.5 mg/dL (ref 8.5–10.2)
CHLORIDE: 102 mmol/L (ref 98–107)
CO2: 26 mmol/L (ref 22.0–30.0)
CREATININE: 0.73 mg/dL (ref 0.70–1.30)
EGFR MDRD AF AMER: >=60 mL/min/{1.73_m2} (ref >=60)
GLUCOSE RANDOM: 95 mg/dL (ref 65–179)
POTASSIUM: 4.1 mmol/L (ref 3.5–5.0)
PROTEIN TOTAL: 5.3 g/dL — ABNORMAL LOW (ref 6.5–8.3)
SODIUM: 135 mmol/L (ref 135–145)

## 2017-11-19 LAB — CBC W/ AUTO DIFF
HEMATOCRIT: 22.9 % — ABNORMAL LOW (ref 41.0–53.0)
HEMOGLOBIN: 7.8 g/dL — ABNORMAL LOW (ref 13.5–17.5)
MEAN CORPUSCULAR HEMOGLOBIN CONC: 34.1 g/dL (ref 31.0–37.0)
MEAN CORPUSCULAR HEMOGLOBIN: 30.3 pg (ref 26.0–34.0)
MEAN CORPUSCULAR VOLUME: 88.8 fL (ref 80.0–100.0)
NUCLEATED RED BLOOD CELLS: 2 /100{WBCs} (ref <=4)
PLATELET COUNT: 59 10*9/L — ABNORMAL LOW (ref 150–440)
RED BLOOD CELL COUNT: 2.58 10*12/L — ABNORMAL LOW (ref 4.50–5.90)
WBC ADJUSTED: 1.7 10*9/L — ABNORMAL LOW (ref 4.5–11.0)

## 2017-11-19 LAB — URIC ACID
Urate:MCnc:Pt:Ser/Plas:Qn:: 3.1 — ABNORMAL LOW
Urate:MCnc:Pt:Ser/Plas:Qn:: 3.4 — ABNORMAL LOW

## 2017-11-19 LAB — MANUAL DIFFERENTIAL
BASOPHILS - ABS (DIFF): 0 10*9/L (ref 0.0–0.1)
BLASTS - REL (DIFF): 3 % (ref <=0)
LYMPHOCYTES - ABS (DIFF): 0.6 10*9/L — ABNORMAL LOW (ref 1.5–5.0)
MONOCYTES - ABS (DIFF): 0.6 10*9/L (ref 0.2–0.8)
NEUTROPHILS - ABS (DIFF): 0.4 10*9/L — CL (ref 2.0–7.5)

## 2017-11-19 LAB — PHOSPHORUS
Phosphate:MCnc:Pt:Ser/Plas:Qn:: 4.1
Phosphate:MCnc:Pt:Ser/Plas:Qn:: 4.2

## 2017-11-19 LAB — POTASSIUM
Potassium:SCnc:Pt:Ser/Plas:Qn:: 4.1
Potassium:SCnc:Pt:Ser/Plas:Qn:: 4.5

## 2017-11-19 LAB — MONOCYTES - ABS (DIFF): Lab: 0.6

## 2017-11-19 LAB — MAGNESIUM
Magnesium:MCnc:Pt:Ser/Plas:Qn:: 2
Magnesium:MCnc:Pt:Ser/Plas:Qn:: 2.1

## 2017-11-19 LAB — PLATELET COUNT: Lab: 59 — ABNORMAL LOW

## 2017-11-19 LAB — LACTATE DEHYDROGENASE
Lactate dehydrogenase:CCnc:Pt:Ser/Plas:Qn:: 593
Lactate dehydrogenase:CCnc:Pt:Ser/Plas:Qn:: 716 — ABNORMAL HIGH

## 2017-11-19 NOTE — Unmapped (Signed)
Problem: Patient Care Overview  Goal: Plan of Care Review  No acute changes overnight.  Pt sleeping most of the shift, not very interactive.  Bed alarm in use. Will continue to monitor.

## 2017-11-19 NOTE — Unmapped (Signed)
Problem: Patient Care Overview  Goal: Plan of Care Review  Outcome: Progressing  7a shift summary:  Patient received 1U of pRBC. Continues on antibiotics as ordered. Patient disoriented, but easily redirectable. Patient remained free from falls/injury. VSS, remained afebrile. Bed low and locked with call bell in reach. No complaints expressed at this time, will continue to monitor.

## 2017-11-19 NOTE — Unmapped (Signed)
Addended by: Kem Kays on: 11/19/2017 08:52 AM     Modules accepted: Orders

## 2017-11-19 NOTE — Unmapped (Signed)
Additional order received and the patient is already on the caseload. Please see note from 12/28 for further details.

## 2017-11-20 DIAGNOSIS — C9200 Acute myeloblastic leukemia, not having achieved remission: Principal | ICD-10-CM

## 2017-11-20 LAB — MANUAL DIFFERENTIAL
BASOPHILS - ABS (DIFF): 0 10*9/L (ref 0.0–0.1)
EOSINOPHILS - ABS (DIFF): 0 10*9/L (ref 0.0–0.4)
MONOCYTES - ABS (DIFF): 0.3 10*9/L (ref 0.2–0.8)

## 2017-11-20 LAB — COMPREHENSIVE METABOLIC PANEL
ALBUMIN: 2.9 g/dL — ABNORMAL LOW (ref 3.5–5.0)
ALKALINE PHOSPHATASE: 151 U/L — ABNORMAL HIGH (ref 38–126)
ALT (SGPT): 34 U/L (ref 19–72)
AST (SGOT): 23 U/L (ref 19–55)
BILIRUBIN TOTAL: 1 mg/dL (ref 0.0–1.2)
BLOOD UREA NITROGEN: 35 mg/dL — ABNORMAL HIGH (ref 7–21)
BUN / CREAT RATIO: 47
CALCIUM: 8.8 mg/dL (ref 8.5–10.2)
CO2: 27 mmol/L (ref 22.0–30.0)
CREATININE: 0.75 mg/dL (ref 0.70–1.30)
EGFR MDRD AF AMER: >=60 mL/min/{1.73_m2} (ref >=60)
EGFR MDRD NON AF AMER: >=60 mL/min/{1.73_m2} (ref >=60)
GLUCOSE RANDOM: 97 mg/dL (ref 65–179)
POTASSIUM: 4.1 mmol/L (ref 3.5–5.0)
SODIUM: 137 mmol/L (ref 135–145)

## 2017-11-20 LAB — BILIRUBIN TOTAL: Bilirubin:MCnc:Pt:Ser/Plas:Qn:: 1

## 2017-11-20 LAB — URIC ACID
Urate:MCnc:Pt:Ser/Plas:Qn:: 3 — ABNORMAL LOW
Urate:MCnc:Pt:Ser/Plas:Qn:: 3.4 — ABNORMAL LOW

## 2017-11-20 LAB — MAGNESIUM
Magnesium:MCnc:Pt:Ser/Plas:Qn:: 2
Magnesium:MCnc:Pt:Ser/Plas:Qn:: 2.1

## 2017-11-20 LAB — CBC W/ AUTO DIFF
HEMATOCRIT: 21.8 % — ABNORMAL LOW (ref 41.0–53.0)
HEMOGLOBIN: 7.4 g/dL — ABNORMAL LOW (ref 13.5–17.5)
MEAN CORPUSCULAR HEMOGLOBIN: 30.2 pg (ref 26.0–34.0)
MEAN CORPUSCULAR VOLUME: 89.2 fL (ref 80.0–100.0)
PLATELET COUNT: 66 10*9/L — ABNORMAL LOW (ref 150–440)
RED BLOOD CELL COUNT: 2.44 10*12/L — ABNORMAL LOW (ref 4.50–5.90)
RED CELL DISTRIBUTION WIDTH: 14.4 % (ref 12.0–15.0)

## 2017-11-20 LAB — PHOSPHORUS
Phosphate:MCnc:Pt:Ser/Plas:Qn:: 4.3
Phosphate:MCnc:Pt:Ser/Plas:Qn:: 4.4

## 2017-11-20 LAB — LACTATE DEHYDROGENASE
Lactate dehydrogenase:CCnc:Pt:Ser/Plas:Qn:: 519
Lactate dehydrogenase:CCnc:Pt:Ser/Plas:Qn:: 532

## 2017-11-20 LAB — MEAN CORPUSCULAR HEMOGLOBIN CONC: Lab: 33.8

## 2017-11-20 LAB — MONOCYTES - ABS (DIFF): Lab: 0.3

## 2017-11-20 LAB — POTASSIUM: Potassium:SCnc:Pt:Ser/Plas:Qn:: 3.9

## 2017-11-20 NOTE — Unmapped (Signed)
Problem: Patient Care Overview  Goal: Plan of Care Review  Outcome: Progressing  7a shift summary:  Patient remained free from falls/injury. VSS, remained afebrile. Bed low and locked with call bell in reach. No complaints expressed at this time, will continue to monitor.

## 2017-11-20 NOTE — Unmapped (Signed)
Problem: Patient Care Overview  Goal: Plan of Care Review  Outcome: Progressing  Pt drowsy and disoriented to time and place; afebrile throughout shift; VSS. Pt denied nausea, pain. No falls or acute events; pt used urinal. Plan is for pt to be d/c to SNF. WCTM.     Problem: Fall Risk (Adult)  Goal: Absence of Fall  Patient will demonstrate the desired outcomes by discharge/transition of care.   Outcome: Progressing   11/20/17 0503   Fall Risk (Adult)   Absence of Fall making progress toward outcome

## 2017-11-20 NOTE — Unmapped (Signed)
Medicine Daily Progress Note    Interval Events/Subjective:   No specific complaints other than to say that he has felt sick for awhile. Asked about leaving the hospital today. Inconsistently endorsed depressed mood, but refused interest in talking to anyone about his mood.     Assessment/Plan:  Principal Problem:    Acute myeloid leukemia (AML), M5 (CMS-HCC)  Active Problems:    Coronary atherosclerosis of native coronary artery    Failure to thrive in adult  Resolved Problems:    * No resolved hospital problems. *   Malnutrition Evaluation as performed by RD, LDN: Severe Protein-Calorie Malnutrition in the context of chronic illness (11/12/17 0910)     Arthur Bates is a 72 y.o. male who presented to [facility] with Acute myeloid leukemia (AML), M5 (CMS-HCC).    Acute Myeloid Leukemia: Diagnosed in October 2018. Flow- 39% blasts- CD34+ (subset), CD13, CD15, CD7, CD11c ,CD33, CD117, HLA-DR. Normal cytogenetics and no molecular mutations assessed. Hyperleukocytosis to 28,000 with 10% relative blasts on diff. Patient reported low grade fevers at home. Now through cycle two of azacitadine and successful titration of venetoclax without evidence of tumor lysis.  -- Allopurinol 300 mg daily  -- Venetoclax 400 mg  -- Consider prophylactic posaconazole (pending outpatient cost feasibility)  -- Acyclovir prophylaxis  -- Levofloxacin per below  -- Tumor lysis labs     Hypoactive behavior: Patient has been somewhat sleepy on rounds, and though he has risk factors for it, is without inattention or waxing/waning to suggest delirium. Mood seems down, understandably, but unclear what role this is playing. Cefepime neurotoxicity is a consideration given apparent new onset since admission. No focal neurologic deficits to suggest structural issue but patient with known malignancy and probable bleeding diathesis, makes this a possibility.  - Discontinue cefepime  - Low-threshold for CT head  ??  Cough: Patient describes cough productive of clear sputum, intermittently for 1 month. Emphysema noted on prior CT chest at outside facility, but images not in system. CXR in ED negative for acute airspace disease.  On 12/30 PM reported cough worsened.  CT chest w/o on 11/15/2018 showed bibasilar subpleural opacities, likely fibrosis. No consolidations or discrete nodules to suggest infection.    Fever: Hemodynamically stable without a fever since 1/1 but still severely neutropenic. Concern re possible neurotoxicity from cefepime so will transition to levofloxacin given stability from an infectious standpoint.  - Follow-up blood cultures  - Stop cefepime  - Start levofloxacin 500 mg daily  ??  Failure to Thrive, Severe protein calorie malnutrition: Patient reports 20 lb weight loss in approximately 2 weeks. He has poor appetite, worsened by his medications, and difficulty swallowing. Denies any n/v.  - SLP consult  - Nutrition consult   - Encourage Ensure intake  - Remeron 7.5 mg qhs  ??  Near-falls, gait instability: Patient reports feeling unsteady on his feet. Has not fallen at home but describes pins and needles sensation in the lateral four toes of his left foot. Denies leg weakness. No history of diabetes. No B12 deficiency.  - PT/OT  ??  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 No recent echo available.   - Holding lipitor   ??  GERD:  Continue home famotidine  ??  Code Status: Full Code  ___________________________________________________________________    Labs/Studies:  Labs and Studies from the last 24hrs per EMR and Reviewed    Pressure Ulcer(s)    Active Pressure Ulcer     None  Objective:  Temp:  [36.2 ??C-37.1 ??C] 36.4 ??C  Heart Rate:  [82-118] 90  Resp:  [16-18] 16  BP: (101-151)/(50-71) 128/67  SpO2:  [96 %-100 %] 100 %    GEN: NAD, lying in bed, thin male, somnolent. Awakes easily and answers questions, but inconsistently and sometimes trails off when speaking.  ENT: MMM  CV: RRR, no murmur or rubs  PULM: CTAB, normal work of breathing, on room air  ABD: soft, flat, NT/ND, +BS  EXT: No edema. Mild wasting.  Neuro: no focal deficits, face is symmetric. Oriented to self, month, year and location being hospital. Disoriented to specific location or circumstance.

## 2017-11-20 NOTE — Unmapped (Signed)
Medicine Daily Progress Note    Interval Events/Subjective:   NAEON.  States he continues to feel sick overall, but does not endorse specific complaints.  Wife at bedside says he is more agitated and continues to have low appetite.  Wife endorses episodes of slurred speech and decreased cognition. Will obtain MRI brain today and increase remeron to aid in appetite stimulation.    Assessment/Plan:  Principal Problem:    Acute myeloid leukemia (AML), M5 (CMS-HCC)  Active Problems:    Coronary atherosclerosis of native coronary artery    Failure to thrive in adult  Resolved Problems:    * No resolved hospital problems. *   Malnutrition Evaluation as performed by RD, LDN: Severe Protein-Calorie Malnutrition in the context of chronic illness (11/12/17 0910)     Arthur Bates is a 72 y.o. male who presented to [facility] with Acute myeloid leukemia (AML), M5 (CMS-HCC).    Acute Myeloid Leukemia: Diagnosed in October 2018. Flow- 39% blasts- CD34+ (subset), CD13, CD15, CD7, CD11c ,CD33, CD117, HLA-DR. Normal cytogenetics and no molecular mutations assessed. Hyperleukocytosis to 28,000 with 10% relative blasts on diff. Patient reported low grade fevers at home. Now through cycle two of azacitadine and successful titration of venetoclax without evidence of tumor lysis.  -- Allopurinol 300 mg daily  -- Venetoclax 400 mg  -- Consider prophylactic posaconazole (pending outpatient cost feasibility)-  Pharmacy spoke with pt wife- will start early in week (once started will titrate venetoclax down 2/2 posa increasing therapeutic levels)  -- Acyclovir prophylaxis  -- Levofloxacin per below  -- Tumor lysis labs     Hypoactive behavior: Patient has been somewhat sleepy on rounds, and though he has risk factors for it, is without inattention or waxing/waning to suggest delirium. Mood seems down, understandably, but unclear what role this is playing. Cefepime neurotoxicity is a consideration given apparent new onset since admission. No focal neurologic deficits to suggest structural issue but patient with known malignancy and probable bleeding diathesis, makes this a possibility.  - Discontinue cefepime 1/4  - Obtain MRI brain for further evaluation   ??  Cough: Patient describes cough productive of clear sputum, intermittently for 1 month. Emphysema noted on prior CT chest at outside facility, but images not in system. CXR in ED negative for acute airspace disease.  On 12/30 PM reported cough worsened.  CT chest w/o on 11/15/2018 showed bibasilar subpleural opacities, likely fibrosis. No consolidations or discrete nodules to suggest infection.    Fever: Hemodynamically stable without a fever since 1/1 but still severely neutropenic. Concern re possible neurotoxicity from cefepime so will transition to levofloxacin given stability from an infectious standpoint.  - BCx's NGTD  - Stop cefepime 1/4  - Start levofloxacin 500 mg daily (1/5)  ??  Failure to Thrive, Severe protein calorie malnutrition: Patient reports 20 lb weight loss in approximately 2 weeks. He has poor appetite, worsened by his medications, and difficulty swallowing. Denies any n/v.  - SLP consult  - Nutrition consult   - Encourage Ensure intake  - Increased Remeron to 15 mg qhs  ??  Near-falls, gait instability: Patient reports feeling unsteady on his feet. Has not fallen at home but describes pins and needles sensation in the lateral four toes of his left foot. Denies leg weakness. No history of diabetes. No B12 deficiency.  - PT/OT- recommend 3x weekly HH  ??  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 No recent echo available.   - Holding  lipitor   ??  GERD:  Continue home famotidine  ??  Code Status: Full Code  ___________________________________________________________________    Labs/Studies:  Labs and Studies from the last 24hrs per EMR and Reviewed    Pressure Ulcer(s)    Active Pressure Ulcer     None              Objective:  Temp:  [36.4 ??C-36.9 ??C] 36.9 ??C  Heart Rate:  [80-90] 86  Resp:  [16] 16  BP: (120-140)/(58-76) 139/76  SpO2:  [97 %-100 %] 99 %    GEN: NAD, lying in bed, thin male, somnolent. Awakes easily and answers questions, but inconsistently and sometimes trails off when speaking.  ENT: MMM  CV: RRR, no murmur or rubs  PULM: CTAB, normal work of breathing, on room air  ABD: soft, flat, NT/ND, +BS  EXT: No edema. Mild wasting.  Neuro: no focal deficits, face is symmetric. A&Ox3

## 2017-11-21 LAB — MEAN CORPUSCULAR HEMOGLOBIN CONC: Lab: 34

## 2017-11-21 LAB — COMPREHENSIVE METABOLIC PANEL
ALKALINE PHOSPHATASE: 145 U/L — ABNORMAL HIGH (ref 38–126)
ALT (SGPT): 31 U/L (ref 19–72)
ANION GAP: 6 mmol/L — ABNORMAL LOW (ref 9–15)
AST (SGOT): 25 U/L (ref 19–55)
BLOOD UREA NITROGEN: 28 mg/dL — ABNORMAL HIGH (ref 7–21)
BUN / CREAT RATIO: 37
CALCIUM: 8.9 mg/dL (ref 8.5–10.2)
CHLORIDE: 103 mmol/L (ref 98–107)
CO2: 29 mmol/L (ref 22.0–30.0)
CREATININE: 0.76 mg/dL (ref 0.70–1.30)
EGFR MDRD AF AMER: >=60 mL/min/{1.73_m2} (ref >=60)
EGFR MDRD NON AF AMER: >=60 mL/min/{1.73_m2} (ref >=60)
GLUCOSE RANDOM: 105 mg/dL (ref 65–179)
POTASSIUM: 4.3 mmol/L (ref 3.5–5.0)
PROTEIN TOTAL: 5.6 g/dL — ABNORMAL LOW (ref 6.5–8.3)
SODIUM: 138 mmol/L (ref 135–145)

## 2017-11-21 LAB — URIC ACID
Urate:MCnc:Pt:Ser/Plas:Qn:: 2.5 — ABNORMAL LOW
Urate:MCnc:Pt:Ser/Plas:Qn:: 3.1 — ABNORMAL LOW

## 2017-11-21 LAB — CBC W/ AUTO DIFF
HEMATOCRIT: 20.3 % — ABNORMAL LOW (ref 41.0–53.0)
HEMOGLOBIN: 6.9 g/dL — ABNORMAL LOW (ref 13.5–17.5)
MEAN CORPUSCULAR HEMOGLOBIN: 29.8 pg (ref 26.0–34.0)
MEAN CORPUSCULAR VOLUME: 87.5 fL (ref 80.0–100.0)
NUCLEATED RED BLOOD CELLS: 2 /100{WBCs} (ref <=4)
PLATELET COUNT: 64 10*9/L — ABNORMAL LOW (ref 150–440)
RED BLOOD CELL COUNT: 2.32 10*12/L — ABNORMAL LOW (ref 4.50–5.90)
RED CELL DISTRIBUTION WIDTH: 14.3 % (ref 12.0–15.0)
WBC ADJUSTED: 0.8 10*9/L — ABNORMAL LOW (ref 4.5–11.0)

## 2017-11-21 LAB — MANUAL DIFFERENTIAL
BASOPHILS - ABS (DIFF): 0 10*9/L (ref 0.0–0.1)
LYMPHOCYTES - ABS (DIFF): 0.6 10*9/L — ABNORMAL LOW (ref 1.5–5.0)
MONOCYTES - ABS (DIFF): 0.1 10*9/L — ABNORMAL LOW (ref 0.2–0.8)

## 2017-11-21 LAB — POTASSIUM: Potassium:SCnc:Pt:Ser/Plas:Qn:: 4

## 2017-11-21 LAB — LYMPHOCYTES - ABS (DIFF): Lab: 0.6 — ABNORMAL LOW

## 2017-11-21 LAB — PHOSPHORUS
Phosphate:MCnc:Pt:Ser/Plas:Qn:: 3.8
Phosphate:MCnc:Pt:Ser/Plas:Qn:: 4.3

## 2017-11-21 LAB — BUN / CREAT RATIO: Urea nitrogen/Creatinine:MRto:Pt:Ser/Plas:Qn:: 37

## 2017-11-21 LAB — LACTATE DEHYDROGENASE
Lactate dehydrogenase:CCnc:Pt:Ser/Plas:Qn:: 452
Lactate dehydrogenase:CCnc:Pt:Ser/Plas:Qn:: 471

## 2017-11-21 LAB — MAGNESIUM
Magnesium:MCnc:Pt:Ser/Plas:Qn:: 1.9
Magnesium:MCnc:Pt:Ser/Plas:Qn:: 2

## 2017-11-21 NOTE — Unmapped (Signed)
Problem: Patient Care Overview  Goal: Plan of Care Review  Outcome: Progressing  Pt in bed no distress noted, denies pain, afebrile. Pt oriented to self only, spouse at bedside. Pt went off unit for a procedure and sat on chair during shift.    Problem: VTE, DVT and PE (Adult)  Goal: Signs and Symptoms of Listed Potential Problems Will be Absent, Minimized or Managed (VTE, DVT and PE)  Signs and symptoms of listed potential problems will be absent, minimized or managed by discharge/transition of care (reference VTE, DVT and PE (Adult) CPG).   Outcome: Progressing      Problem: Oncology Care (Adult)  Goal: Signs and Symptoms of Listed Potential Problems Will be Absent, Minimized or Managed (Oncology Care)  Signs and symptoms of listed potential problems will be absent, minimized or managed by discharge/transition of care (reference Oncology Care (Adult) CPG).   Outcome: Progressing

## 2017-11-21 NOTE — Unmapped (Signed)
Problem: Patient Care Overview  Goal: Plan of Care Review  Outcome: Progressing   11/21/17 0453   OTHER   Plan of Care Reviewed With patient;spouse   Outcome Summary VSSA, AAO2-3, up w/ SB assist. Bed alarm ON while wife asleep at bedside, no exit attempts made. Compliant w/ Q2turns. Somnolent throughout night, endorses generally feeling sick and having no appetite. Denies nausea, reflux. No new complaints, no reported or oberved falls or injuries, NAEON.    Plan of Care Review   Progress no change     Goal: Individualization and Mutuality  Outcome: Progressing   11/21/17 0453   OTHER   What Anxieties, Fears, Concerns, or Questions Do You Have About Your Care? none shared   How to Address Anxieties/Fears encourage verbalization of anxieties/fears/concerns   Individualization   Patient Specific Preferences Pills whole with sips of liquids   Patient Specific Goals (Include Timeframe) Appetite will improve 1/6   Patient Specific Interventions Remeron given as dosed, nutrition recs reviewed, Q2turns       Problem: Fall Risk (Adult)  Intervention: Monitor/Assist with Self Care   11/21/17 0453   Interventions   Activity Assistance Provided assistance, stand-by;assistance, 1 person   Self-Care Promotion independence encouraged;BADL personal objects within reach;BADL personal routines maintained;safe use of adaptive equipment encouraged     Intervention: Reduce Risk/Promote Restraint Free Environment   11/21/17 0453   Interventions   Environmental Safety Modification assistive device/personal items within reach;clutter free environment maintained;lighting adjusted;room organization consistent   Safety Interventions assistive device;bed alarm;chemotherapeutic agent precautions;commode/urinal/bedpan at bedside;fall reduction program maintained;family at bedside;infection management;isolation precautions;lighting adjusted for tasks/safety;low bed;mobility aid;neutropenic precautions;nonskid shoes/slippers when out of bed;supervised activity   Safety/Security Measures bed alarm set     Intervention: Review Medications/Identify Contributors to Fall Risk   11/21/17 0453   Interventions   Medication Review/Management medications reviewed       Goal: Absence of Fall  Patient will demonstrate the desired outcomes by discharge/transition of care.   Outcome: Progressing   11/21/17 0453   Fall Risk (Adult)   Absence of Fall achieves outcome       Problem: VTE, DVT and PE (Adult)  Intervention: Prevent/Manage Thrombi/Emboli   11/19/17 2154 11/21/17 0453   Interventions   VTE Prevention/Management --  bleeding risk factors identified;fluids promoted   Bleeding Precautions --  monitored for signs of bleeding   Anti-Embolism Intervention Refused --        Goal: Signs and Symptoms of Listed Potential Problems Will be Absent, Minimized or Managed (VTE, DVT and PE)  Signs and symptoms of listed potential problems will be absent, minimized or managed by discharge/transition of care (reference VTE, DVT and PE (Adult) CPG).   Outcome: Progressing   11/21/17 0453   VTE, DVT and PE (Adult)   Problems Assessed (VTE, DVT, PE) all   Problems Present (VTE, DVT, PE) none       Problem: Skin Injury Risk (Adult)  Intervention: Prevent/Manage Excess Moisture   11/20/17 2043   Interventions   Skin Protection incontinence pads utilized     Intervention: Maintain Head of Bed Elevation Less Than 30 Degrees as Tolerated   11/20/17 2245   Interventions   Head of Bed (HOB) HOB elevated     Intervention: Prevent/Minimize Shear/Friction Injuries   11/20/17 2043 11/20/17 2245   Interventions   Positioning/Transfer Devices --  pillows;in use   Pressure Reduction Devices positioning supports utilized;pressure-redistributing mattress utilized --      Intervention: Turn/Reposition Often   11/21/17 0411  11/21/17 0453   Interventions   Pressure Reduction Techniques --  frequent weight shift encouraged;heels elevated off bed;weight shift assistance provided   Positioning for Skin Supine/Back --        Goal: Skin Health and Integrity  Patient will demonstrate the desired outcomes by discharge/transition of care.   Outcome: Progressing   11/21/17 0453   Skin Injury Risk (Adult)   Skin Health and Integrity making progress toward outcome

## 2017-11-21 NOTE — Unmapped (Signed)
Medicine Daily Progress Note    Interval Events/Subjective:   NAEON.  Patient still very fatigued and hypoactive.  Wife concerned because he sleeps most of the day and has minimal appetite, though did drink 1-1/2 ensures this morning, increased Remeron last night that could be contributing. MRI brain yesterday did not reveal acute intracranial process.  Will consult med M for LP to complete workup.  If negative, will consult psychiatry.  Would also like to have PT reevaluate patient as he has functionally declined since prior evaluation.    Assessment/Plan:  Principal Problem:    Acute myeloid leukemia (AML), M5 (CMS-HCC)  Active Problems:    Coronary atherosclerosis of native coronary artery    Failure to thrive in adult  Resolved Problems:    * No resolved hospital problems. *   Malnutrition Evaluation as performed by RD, LDN: Severe Protein-Calorie Malnutrition in the context of chronic illness (11/12/17 0910)     Arthur Bates is a 72 y.o. male who presented to [facility] with Acute myeloid leukemia (AML), M5 (CMS-HCC).    Acute Myeloid Leukemia: Diagnosed in October 2018. Flow- 39% blasts- CD34+ (subset), CD13, CD15, CD7, CD11c ,CD33, CD117, HLA-DR. Normal cytogenetics and no molecular mutations assessed. Hyperleukocytosis to 28,000 with 10% relative blasts on diff. Patient reported low grade fevers at home. Now through cycle two of azacitadine and successful titration of venetoclax without evidence of tumor lysis.  -- Allopurinol 300 mg daily  -- Venetoclax 400 mg  -- Consider prophylactic posaconazole (pending outpatient cost feasibility)-  Pharmacy spoke with pt wife- will start early in week (once started will titrate venetoclax down 2/2 posa increasing therapeutic levels)  -- Acyclovir prophylaxis  -- Levofloxacin per below  -- Tumor lysis labs     Hypoactive behavior: Patient has been somewhat sleepy on rounds, and though he has risk factors for it, is without inattention or waxing/waning to suggest delirium. Mood seems down, understandably, but unclear what role this is playing. Cefepime neurotoxicity is a consideration given apparent new onset since admission. No focal neurologic deficits to suggest structural issue but patient with known malignancy and probable bleeding diathesis, makes this a possibility.  MRI brain on 1/5 did not reveal acute intracranial process.  We will obtain LP to rule out leukemia and CSF to complete workup.  If negative, will consult psychiatry.  - Discontinue cefepime 1/4  - Consult med M for LP  ??  Cough: Patient describes cough productive of clear sputum, intermittently for 1 month. Emphysema noted on prior CT chest at outside facility, but images not in system. CXR in ED negative for acute airspace disease.  On 12/30 PM reported cough worsened.  CT chest w/o on 11/15/2018 showed bibasilar subpleural opacities, likely fibrosis. No consolidations or discrete nodules to suggest infection.    Fever: Hemodynamically stable without a fever since 1/1 but still severely neutropenic. Concern re possible neurotoxicity from cefepime so will transition to levofloxacin given stability from an infectious standpoint.  - BCx's NGTD  - Stopped cefepime 1/4  - Start levofloxacin 500 mg daily (1/5 - )  ??  Failure to Thrive, Severe protein calorie malnutrition: Patient reports 20 lb weight loss in approximately 2 weeks. He has poor appetite, worsened by his medications, and difficulty swallowing. Denies any n/v.  - SLP consult  - Nutrition consult   - Encourage Ensure intake  - Increased Remeron to 15 mg qhs  ??  Near-falls, gait instability: Patient reports feeling unsteady on his feet. Has not  fallen at home but describes pins and needles sensation in the lateral four toes of his left foot. Denies leg weakness. No history of diabetes. No B12 deficiency.  - PT/OT- recommend 3x weekly HH, will consult for reevaluation as his functional status has declined since last evaluation.  ??  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 No recent echo available.   - Holding lipitor   ??  GERD:  Continue home famotidine  ??  Code Status: Full Code  ___________________________________________________________________    Labs/Studies:  Labs and Studies from the last 24hrs per EMR and Reviewed    Pressure Ulcer(s)    Active Pressure Ulcer     None              Objective:  Temp:  [36.5 ??C-37.3 ??C] 36.6 ??C  Heart Rate:  [88-103] 95  Resp:  [16-20] 20  BP: (83-138)/(53-76) 119/61  SpO2:  [97 %-100 %] 100 %    GEN: NAD, lying in bed, thin male, somnolent. Awakes easily and answers questions, but inconsistently and sometimes trails off when speaking.  ENT: MMM  CV: RRR, no murmur or rubs  PULM: CTAB, normal work of breathing, on room air  ABD: soft, flat, NT/ND, +BS  EXT: No edema. Mild wasting.  Neuro: no focal deficits, face is symmetric. A&Ox3

## 2017-11-22 DIAGNOSIS — Z881 Allergy status to other antibiotic agents status: Secondary | ICD-10-CM | POA: Diagnosis not present

## 2017-11-22 DIAGNOSIS — Z96653 Presence of artificial knee joint, bilateral: Secondary | ICD-10-CM | POA: Diagnosis not present

## 2017-11-22 DIAGNOSIS — E860 Dehydration: Secondary | ICD-10-CM | POA: Diagnosis not present

## 2017-11-22 DIAGNOSIS — C9200 Acute myeloblastic leukemia, not having achieved remission: Secondary | ICD-10-CM | POA: Diagnosis not present

## 2017-11-22 DIAGNOSIS — Z955 Presence of coronary angioplasty implant and graft: Secondary | ICD-10-CM | POA: Diagnosis not present

## 2017-11-22 DIAGNOSIS — I2510 Atherosclerotic heart disease of native coronary artery without angina pectoris: Secondary | ICD-10-CM | POA: Diagnosis not present

## 2017-11-22 DIAGNOSIS — I714 Abdominal aortic aneurysm, without rupture: Secondary | ICD-10-CM | POA: Diagnosis not present

## 2017-11-22 DIAGNOSIS — Z6821 Body mass index (BMI) 21.0-21.9, adult: Secondary | ICD-10-CM | POA: Diagnosis not present

## 2017-11-22 DIAGNOSIS — E785 Hyperlipidemia, unspecified: Secondary | ICD-10-CM | POA: Diagnosis not present

## 2017-11-22 DIAGNOSIS — I10 Essential (primary) hypertension: Secondary | ICD-10-CM | POA: Diagnosis not present

## 2017-11-22 DIAGNOSIS — R5081 Fever presenting with conditions classified elsewhere: Secondary | ICD-10-CM | POA: Diagnosis not present

## 2017-11-22 DIAGNOSIS — D709 Neutropenia, unspecified: Secondary | ICD-10-CM | POA: Diagnosis not present

## 2017-11-22 DIAGNOSIS — Z87891 Personal history of nicotine dependence: Secondary | ICD-10-CM | POA: Diagnosis not present

## 2017-11-22 DIAGNOSIS — Z9221 Personal history of antineoplastic chemotherapy: Secondary | ICD-10-CM | POA: Diagnosis not present

## 2017-11-22 DIAGNOSIS — I252 Old myocardial infarction: Secondary | ICD-10-CM | POA: Diagnosis not present

## 2017-11-22 DIAGNOSIS — Z79899 Other long term (current) drug therapy: Secondary | ICD-10-CM | POA: Diagnosis not present

## 2017-11-22 DIAGNOSIS — E43 Unspecified severe protein-calorie malnutrition: Secondary | ICD-10-CM | POA: Diagnosis not present

## 2017-11-22 DIAGNOSIS — K219 Gastro-esophageal reflux disease without esophagitis: Secondary | ICD-10-CM | POA: Diagnosis not present

## 2017-11-22 DIAGNOSIS — K5900 Constipation, unspecified: Secondary | ICD-10-CM | POA: Diagnosis not present

## 2017-11-22 DIAGNOSIS — J439 Emphysema, unspecified: Secondary | ICD-10-CM | POA: Diagnosis not present

## 2017-11-22 DIAGNOSIS — D61818 Other pancytopenia: Secondary | ICD-10-CM | POA: Diagnosis not present

## 2017-11-22 DIAGNOSIS — M179 Osteoarthritis of knee, unspecified: Secondary | ICD-10-CM | POA: Diagnosis not present

## 2017-11-22 LAB — COMPREHENSIVE METABOLIC PANEL
ALBUMIN: 2.7 g/dL — ABNORMAL LOW (ref 3.5–5.0)
ALKALINE PHOSPHATASE: 134 U/L — ABNORMAL HIGH (ref 38–126)
ALT (SGPT): 25 U/L (ref 19–72)
ANION GAP: 5 mmol/L — ABNORMAL LOW (ref 9–15)
AST (SGOT): 22 U/L (ref 19–55)
BILIRUBIN TOTAL: 1.6 mg/dL — ABNORMAL HIGH (ref 0.0–1.2)
BLOOD UREA NITROGEN: 24 mg/dL — ABNORMAL HIGH (ref 7–21)
BUN / CREAT RATIO: 33
CHLORIDE: 102 mmol/L (ref 98–107)
CO2: 30 mmol/L (ref 22.0–30.0)
CREATININE: 0.73 mg/dL (ref 0.70–1.30)
EGFR MDRD AF AMER: >=60 mL/min/{1.73_m2} (ref >=60)
EGFR MDRD NON AF AMER: >=60 mL/min/{1.73_m2} (ref >=60)
GLUCOSE RANDOM: 96 mg/dL (ref 65–179)
POTASSIUM: 4 mmol/L (ref 3.5–5.0)
PROTEIN TOTAL: 5.4 g/dL — ABNORMAL LOW (ref 6.5–8.3)
SODIUM: 137 mmol/L (ref 135–145)

## 2017-11-22 LAB — MANUAL DIFFERENTIAL
EOSINOPHILS - ABS (DIFF): 0 10*9/L (ref 0.0–0.4)
LYMPHOCYTES - ABS (DIFF): 0.8 10*9/L — ABNORMAL LOW (ref 1.5–5.0)
MONOCYTES - ABS (DIFF): 0.2 10*9/L (ref 0.2–0.8)
NEUTROPHILS - ABS (DIFF): 0.1 10*9/L — CL (ref 2.0–7.5)

## 2017-11-22 LAB — PHOSPHORUS
Phosphate:MCnc:Pt:Ser/Plas:Qn:: 3.9
Phosphate:MCnc:Pt:Ser/Plas:Qn:: 4

## 2017-11-22 LAB — CBC W/ AUTO DIFF
HEMOGLOBIN: 7.7 g/dL — ABNORMAL LOW (ref 13.5–17.5)
MEAN CORPUSCULAR HEMOGLOBIN: 30.8 pg (ref 26.0–34.0)
MEAN PLATELET VOLUME: 11 fL — ABNORMAL HIGH (ref 7.0–10.0)
PLATELET COUNT: 58 10*9/L — ABNORMAL LOW (ref 150–440)
RED BLOOD CELL COUNT: 2.51 10*12/L — ABNORMAL LOW (ref 4.50–5.90)
RED CELL DISTRIBUTION WIDTH: 14.1 % (ref 12.0–15.0)
WBC ADJUSTED: 1.1 10*9/L — ABNORMAL LOW (ref 4.5–11.0)

## 2017-11-22 LAB — POTASSIUM: Potassium:SCnc:Pt:Ser/Plas:Qn:: 3.9

## 2017-11-22 LAB — LACTATE DEHYDROGENASE
Lactate dehydrogenase:CCnc:Pt:Ser/Plas:Qn:: 398
Lactate dehydrogenase:CCnc:Pt:Ser/Plas:Qn:: 434

## 2017-11-22 LAB — MEAN PLATELET VOLUME: Lab: 11 — ABNORMAL HIGH

## 2017-11-22 LAB — MAGNESIUM
Magnesium:MCnc:Pt:Ser/Plas:Qn:: 1.8
Magnesium:MCnc:Pt:Ser/Plas:Qn:: 1.9

## 2017-11-22 LAB — BLOOD UREA NITROGEN: Urea nitrogen:MCnc:Pt:Ser/Plas:Qn:: 24 — ABNORMAL HIGH

## 2017-11-22 LAB — URIC ACID
Urate:MCnc:Pt:Ser/Plas:Qn:: 2.3 — ABNORMAL LOW
Urate:MCnc:Pt:Ser/Plas:Qn:: 2.7 — ABNORMAL LOW

## 2017-11-22 LAB — BASOPHILS - ABS (DIFF): Lab: 0

## 2017-11-22 NOTE — Unmapped (Signed)
Problem: Patient Care Overview  Goal: Plan of Care Review  Outcome: Progressing   11/22/17 0248   OTHER   Plan of Care Reviewed With patient   Outcome Summary VSSA, AAO3-4 (reported date as 11/22/17 on the 6th, could not name city, but oriented to state, hospital, situation) Bedrest overnight. No new complaints, no reported/observed falls/injury, NAEON. Turning self frequently throughout night.    Plan of Care Review   Progress improving       Problem: Fall Risk (Adult)  Intervention: Monitor/Assist with Self Care   11/22/17 0248   Interventions   Self-Care Promotion independence encouraged;BADL personal objects within reach;BADL personal routines maintained     Intervention: Reduce Risk/Promote Restraint Free Environment   11/21/17 2140 11/22/17 0248   Interventions   Environmental Safety Modification --  assistive device/personal items within reach;clutter free environment maintained;lighting adjusted   Safety Interventions fall reduction program maintained;bed alarm --        Goal: Absence of Fall  Patient will demonstrate the desired outcomes by discharge/transition of care.   Outcome: Progressing      Problem: Skin Injury Risk (Adult)  Intervention: Promote/Optimize Nutrition   11/22/17 0248   Interventions   Oral Nutrition Promotion calorie dense liquids provided;physical activity promoted     Intervention: Prevent/Manage Excess Moisture   11/22/17 0248   Interventions   Skin Protection incontinence pads utilized     Intervention: Maintain Head of Bed Elevation Less Than 30 Degrees as Tolerated   11/22/17 0248   Interventions   Head of Bed (HOB) HOB flat     Intervention: Prevent/Minimize Shear/Friction Injuries   11/21/17 2130 11/22/17 0248   Interventions   Positioning/Transfer Devices pillows;in use --    Pressure Reduction Devices --  pressure-redistributing mattress utilized     Intervention: Turn/Reposition Often   11/21/17 2316 11/22/17 0125   Interventions   Pressure Reduction Techniques frequent weight shift encouraged --    Positioning for Skin --  Supine/Back       Goal: Skin Health and Integrity  Patient will demonstrate the desired outcomes by discharge/transition of care.   Outcome: Progressing   11/22/17 0248   Skin Injury Risk (Adult)   Skin Health and Integrity making progress toward outcome

## 2017-11-22 NOTE — Unmapped (Signed)
Lumbar Puncture Procedure Note (CPT 62270)    Pre-procedural Planning     Patient Name:: Arthur Bates  Patient MRN: [id number]    Pre-operative Diagnosis:  Altered mental status and AML    Post-operative Diagnosis:Altered mental status and AML    Indications: Altered mental status and AML    Contraindications to performing lumbar puncture: Patient had no contraindications. Specifically, no local skin infections over the proposed puncture site, known elevated ICP other than in idiopathic intracranial hypertension or cryptococcal meningitis, and/or an uncontrolled bleeding diathesis.     Imaging prior to lumbar puncture: Generally, imaging should be obtained prior to performing a lumbar puncture if the patient is > 21 years old, immunocompromised, has had a seizure within one week of presentation, has an abnormal level of consciousness, or an abnormal neurologic exam (including papilledema). CNS imaging has been obtained and does not indicate increased ICP.    Known Bleeding Diathesis: Patient/caregiver denies any known bleeding or platelet disorder.     Antiplatelet Agents: This patient is not on an antiplatelet agent.    Systemic Anticoagulation: This patient is not on full systemic anticoagulation.    Significant Labs:  INR   Date Value Ref Range Status   11/11/2017 1.52  Final     PT   Date Value Ref Range Status   11/11/2017 17.3 (H) 10.2 - 12.8 sec Final     Platelet   Date Value Ref Range Status   11/21/2017 64 (L) 150 - 440 10*9/L Final       Consent: Informed consent was obtained after explanation of the risks (including headache, backache, bleeding that could result in paralysis, and infection) and benefits of the procedure. Refer to the consent documentation.    Procedure Details     Time-out performed immediately prior to the procedure.    Patient was placed in the left lateral decubitus position with hips and neck in flexion.    The superior aspect of the iliac crests were identified, with the traverse demarcating the L4-L5 interspace.  A bedside ultrasound was used to help identify the spinous processes at L4 and the intervertebral spaces were located and marked.  This area was prepped with chlorhexidine and draped in the usual sterile fashion. Sterile technique was used including antiseptics, cap, gloves, hand hygiene, mask, and sterile sheet.  Local anesthesia with 1% lidocaine was applied subcutaneously then deep to the skin. The spinal needle with trocar was introduced at the L2 - L3 interspace.  CSF was obtained after 2 pass(es).  When CSF fluid was noted an opening pressure was obtained.  Samples were collected in four separate tubes and sent to the lab after proper labeling. The spinal needle with trocar was removed, with minimal bleeding noted upon removal. A sterile bandage was placed over the puncture site after holding pressure.            Findings     26 mL of clear spinal fluid was obtained.  The CSF was sent to lab for routine studies including cell count, protein, glucose, culture and gram stain.  Additional studies were sent, including cytology.    Opening Pressure: 13 cm H2O pressure.          Condition     The patient tolerated the procedure well and remains in the same condition as pre-procedure.    Complications and Recommendations     None; patient tolerated the procedure well.      Requesting Service: Oncology/Hematology (MDE)  Time Requested: 12:30  Time Completed: 15:30    ________________________________________________________________  Teaching Physician Attestation  I performed the entirety of the procedure.    Wells Guiles Annette Bertelson  November 21, 2017 4:17 PM

## 2017-11-22 NOTE — Unmapped (Signed)
Problem: Patient Care Overview  Goal: Plan of Care Review  Outcome: Progressing  Pt in bed no distress noted, afebrile, denies pain. Pt oriented to self only, very forgetful. 1 Unit PRBC given and no s/s adverse reactions noted. LP done at bedside. Bed alarm activated.    Problem: VTE, DVT and PE (Adult)  Goal: Signs and Symptoms of Listed Potential Problems Will be Absent, Minimized or Managed (VTE, DVT and PE)  Signs and symptoms of listed potential problems will be absent, minimized or managed by discharge/transition of care (reference VTE, DVT and PE (Adult) CPG).   Outcome: Progressing      Problem: Oncology Care (Adult)  Goal: Signs and Symptoms of Listed Potential Problems Will be Absent, Minimized or Managed (Oncology Care)  Signs and symptoms of listed potential problems will be absent, minimized or managed by discharge/transition of care (reference Oncology Care (Adult) CPG).   Outcome: Progressing

## 2017-11-22 NOTE — Unmapped (Signed)
Medicine Daily Progress Note    Interval Events/Subjective:   NAEON.  Patient feeling much better today, sitting up in bed without acute complaints.  Appetite has improved since increasing remeron. LP performed yesterday, CSF fluid analysis without concern for CNS leukemia.  PT plans to reevaluate patient today as he has functionally declined since prior evaluation.  OT rec 5x Low.  SNF referral pending.     Assessment/Plan:  Principal Problem:    Acute myeloid leukemia (AML), M5 (CMS-HCC)  Active Problems:    Coronary atherosclerosis of native coronary artery    Failure to thrive in adult  Resolved Problems:    * No resolved hospital problems. *   Malnutrition Evaluation as performed by RD, LDN: Severe Protein-Calorie Malnutrition in the context of chronic illness (11/12/17 0910)     Arthur Bates is a 72 y.o. male who presented to [facility] with Acute myeloid leukemia (AML), M5 (CMS-HCC).    Acute Myeloid Leukemia: Diagnosed in October 2018. Flow- 39% blasts- CD34+ (subset), CD13, CD15, CD7, CD11c ,CD33, CD117, HLA-DR. Normal cytogenetics and no molecular mutations assessed. Hyperleukocytosis to 28,000 with 10% relative blasts on diff. Patient reported low grade fevers at home. Now through cycle two of azacitadine and successful titration of venetoclax without evidence of tumor lysis.  -- Allopurinol 300 mg daily  -- Venetoclax 400 mg  -- Started micafungin ppx  -- Will start posaconazole on D21-  (once started will titrate venetoclax down 2/2 posa increasing therapeutic levels)  -- Acyclovir prophylaxis  -- Levofloxacin per below  -- Tumor lysis labs      Hypoactive behavior: Patient has been somewhat sleepy on rounds, and though he has risk factors for it, is without inattention or waxing/waning to suggest delirium. Mood seems down, understandably, but unclear what role this is playing. Cefepime neurotoxicity is a consideration given apparent new onset since admission. No focal neurologic deficits to suggest structural issue but patient with known malignancy and probable bleeding diathesis, makes this a possibility.  MRI brain on 1/5 did not reveal acute intracranial process.  We will obtain LP to rule out leukemia and CSF to complete workup. LP done, CSF without acute findings or concern for CNS leukemia. Patient improved significantly the past 2 days  - Discontinue cefepime 1/4  - Remeron 15 mg qHs  ??  Cough: Patient describes cough productive of clear sputum, intermittently for 1 month. Emphysema noted on prior CT chest at outside facility, but images not in system. CXR in ED negative for acute airspace disease.  On 12/30 PM reported cough worsened.  CT chest w/o on 11/15/2018 showed bibasilar subpleural opacities, likely fibrosis. No consolidations or discrete nodules to suggest infection.    Fever: Hemodynamically stable without a fever since 1/1 but still severely neutropenic. Concern re possible neurotoxicity from cefepime so will transition to levofloxacin given stability from an infectious standpoint.  - BCx's NGTD  - Stopped cefepime 1/4  - Start levofloxacin 500 mg daily (1/5 - )  ??  Failure to Thrive, Severe protein calorie malnutrition: Patient reports 20 lb weight loss in approximately 2 weeks. He has poor appetite, worsened by his medications, and difficulty swallowing. Denies any n/v.  Appetite improving.   - SLP consult  - Nutrition consult   - Encourage Ensure intake  - Remeron 15 mg qhs  ??  Near-falls, gait instability: Patient reports feeling unsteady on his feet. Has not fallen at home but describes pins and needles sensation in the lateral four toes of  his left foot. Denies leg weakness. No history of diabetes. No B12 deficiency.  - PT/OT- recommend 3x weekly HH, will consult for reevaluation as his functional status has declined since last evaluation.  ??  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 No recent echo available.   - Holding lipitor   ??  GERD:  Continue home famotidine Code Status: Full Code  ___________________________________________________________________    Labs/Studies:  Labs and Studies from the last 24hrs per EMR and Reviewed    Pressure Ulcer(s)    Active Pressure Ulcer     None              Objective:  Temp:  [36.4 ??C-37 ??C] 36.4 ??C  Heart Rate:  [77-89] 86  Resp:  [15-16] 16  BP: (105-117)/(54-66) 110/66  SpO2:  [96 %-100 %] 100 %    GEN: NAD, sitting up in bed, much more alert today and conversant.  ENT: MMM  CV: RRR, no murmur or rubs  PULM: CTAB, normal work of breathing, on room air  ABD: soft, flat, NT/ND, +BS  EXT: No edema. Mild wasting.  Neuro: no focal deficits, face is symmetric. A&Ox3

## 2017-11-22 NOTE — Unmapped (Signed)
Med M Procedure Follow up    Assessment/Plan:    Principal Problem:    Acute myeloid leukemia (AML), M5 (CMS-HCC)  Active Problems:    Coronary atherosclerosis of native coronary artery    Failure to thrive in adult      Joshawa Dubin Vyncent Overby is a 72 y.o. y/o male that presents to [facility] with Acute myeloid leukemia (AML), M5 (CMS-HCC).    Patient seen in follow up after lumbar puncture performed yesterday. I reviewed the studies from our procedure. They are significant for  elevated protein and normal glucose with one nucleated cell..  No immediate complications.    Will sign off, please call Med M at [phone] for further issues, procedures.    ___________________________________________________________________    Subjective:    Feeling very well, eager to go home. Mild headache last night after LP but went away on its own.    ROS: Negative except as noted above      Objective:    Vital signs in last 24 hours:  Temp:  [36.5 ??C (97.7 ??F)-37 ??C (98.6 ??F)] 36.6 ??C (97.9 ??F)  Heart Rate:  [85-103] 87  Resp:  [15-20] 16  BP: (105-120)/(54-65) 105/54  SpO2:  [93 %-100 %] 97 %    Physical Exam:  GEN: NAD, lying in bed  EYES: EOMI, pupils equal, round, reactive to light  NEURO: No focal deficits, moving all extremities and no confusion  MSK: No spinal tenderness, LP site is c/d/i    Labs/Studies:  Labs/Studies reviewed by me.  Notable for values below.

## 2017-11-23 LAB — MANUAL DIFFERENTIAL
BASOPHILS - ABS (DIFF): 0 10*9/L (ref 0.0–0.1)
MONOCYTES - ABS (DIFF): 0.1 10*9/L — ABNORMAL LOW (ref 0.2–0.8)
NEUTROPHILS - ABS (DIFF): 0 10*9/L — CL (ref 2.0–7.5)

## 2017-11-23 LAB — URIC ACID: Urate:MCnc:Pt:Ser/Plas:Qn:: 2.5 — ABNORMAL LOW

## 2017-11-23 LAB — LYMPHOCYTES - ABS (DIFF): Lab: 0.5 — ABNORMAL LOW

## 2017-11-23 LAB — COMPREHENSIVE METABOLIC PANEL
ALBUMIN: 2.7 g/dL — ABNORMAL LOW (ref 3.5–5.0)
ALKALINE PHOSPHATASE: 130 U/L — ABNORMAL HIGH (ref 38–126)
ANION GAP: 6 mmol/L — ABNORMAL LOW (ref 9–15)
AST (SGOT): 20 U/L (ref 19–55)
BILIRUBIN TOTAL: 1.1 mg/dL (ref 0.0–1.2)
BLOOD UREA NITROGEN: 21 mg/dL (ref 7–21)
BUN / CREAT RATIO: 29
CALCIUM: 8.8 mg/dL (ref 8.5–10.2)
CHLORIDE: 102 mmol/L (ref 98–107)
CO2: 28 mmol/L (ref 22.0–30.0)
CREATININE: 0.73 mg/dL (ref 0.70–1.30)
EGFR MDRD AF AMER: >=60 mL/min/{1.73_m2} (ref >=60)
EGFR MDRD NON AF AMER: >=60 mL/min/{1.73_m2} (ref >=60)
GLUCOSE RANDOM: 96 mg/dL (ref 65–179)
POTASSIUM: 4.1 mmol/L (ref 3.5–5.0)
PROTEIN TOTAL: 5.2 g/dL — ABNORMAL LOW (ref 6.5–8.3)
SODIUM: 136 mmol/L (ref 135–145)

## 2017-11-23 LAB — LACTATE DEHYDROGENASE: Lactate dehydrogenase:CCnc:Pt:Ser/Plas:Qn:: 366

## 2017-11-23 LAB — MEAN PLATELET VOLUME: Lab: 11.4 — ABNORMAL HIGH

## 2017-11-23 LAB — MAGNESIUM: Magnesium:MCnc:Pt:Ser/Plas:Qn:: 1.8

## 2017-11-23 LAB — CBC W/ AUTO DIFF
HEMATOCRIT: 21.2 % — ABNORMAL LOW (ref 41.0–53.0)
MEAN CORPUSCULAR HEMOGLOBIN CONC: 34.5 g/dL (ref 31.0–37.0)
MEAN CORPUSCULAR HEMOGLOBIN: 30 pg (ref 26.0–34.0)
MEAN CORPUSCULAR VOLUME: 86.8 fL (ref 80.0–100.0)
PLATELET COUNT: 60 10*9/L — ABNORMAL LOW (ref 150–440)
RED BLOOD CELL COUNT: 2.44 10*12/L — ABNORMAL LOW (ref 4.50–5.90)
RED CELL DISTRIBUTION WIDTH: 14.1 % (ref 12.0–15.0)
WBC ADJUSTED: 0.7 10*9/L — ABNORMAL LOW (ref 4.5–11.0)

## 2017-11-23 LAB — PHOSPHORUS: Phosphate:MCnc:Pt:Ser/Plas:Qn:: 4.2

## 2017-11-23 LAB — BUN / CREAT RATIO: Urea nitrogen/Creatinine:MRto:Pt:Ser/Plas:Qn:: 29

## 2017-11-23 LAB — HLA ANTIBODY SCR

## 2017-11-23 MED ORDER — POSACONAZOLE 100 MG TABLET,DELAYED RELEASE: 300 mg | tablet | Freq: Every day | 2 refills | 0 days | Status: AC

## 2017-11-23 MED ORDER — POSACONAZOLE 100 MG TABLET,DELAYED RELEASE
ORAL_TABLET | Freq: Every day | ORAL | 2 refills | 0.00000 days | Status: CP
Start: 2017-11-23 — End: 2017-11-23

## 2017-11-23 MED ORDER — POSACONAZOLE 100 MG TABLET,DELAYED RELEASE: each | 2 refills | 0 days

## 2017-11-23 MED FILL — NOXAFIL/100MG/TABS: NOXAFIL/100MG/TABS | 30 days supply | Qty: 90 | Fill #0

## 2017-11-23 NOTE — Unmapped (Signed)
Problem: Patient Care Overview  Goal: Plan of Care Review  Outcome: Resolved Date Met: 11/23/17   11/23/17 0556   OTHER   Plan of Care Reviewed With patient   Plan of Care Review   Progress improving

## 2017-11-23 NOTE — Unmapped (Signed)
Problem: Patient Care Overview  Goal: Plan of Care Review  VSS, afebrile. No c/o pain. Day 11 of chemo, tolerating well. Continues on IV abx, tolerating well.     Problem: Fall Risk (Adult)  Goal: Absence of Fall  Patient will demonstrate the desired outcomes by discharge/transition of care.   Outcome: Progressing   11/22/17 1709   Fall Risk (Adult)   Absence of Fall making progress toward outcome       Problem: VTE, DVT and PE (Adult)  Goal: Signs and Symptoms of Listed Potential Problems Will be Absent, Minimized or Managed (VTE, DVT and PE)  Signs and symptoms of listed potential problems will be absent, minimized or managed by discharge/transition of care (reference VTE, DVT and PE (Adult) CPG).   Outcome: Progressing   11/22/17 1709   VTE, DVT and PE (Adult)   Problems Assessed (VTE, DVT, PE) all   Problems Present (VTE, DVT, PE) none       Problem: Oncology Care (Adult)  Goal: Signs and Symptoms of Listed Potential Problems Will be Absent, Minimized or Managed (Oncology Care)  Signs and symptoms of listed potential problems will be absent, minimized or managed by discharge/transition of care (reference Oncology Care (Adult) CPG).   Outcome: Progressing   11/22/17 1709   Oncology Care (Adult)   Problems Assessed (Oncology Care) all   Problems Present (Oncology Care) fatigue;situational response       Problem: Skin Injury Risk (Adult)  Goal: Identify Related Risk Factors and Signs and Symptoms  Related risk factors and signs and symptoms are identified upon initiation of Human Response Clinical Practice Guideline (CPG).   Outcome: Progressing    Goal: Skin Health and Integrity  Patient will demonstrate the desired outcomes by discharge/transition of care.   Outcome: Progressing   11/22/17 1709   Skin Injury Risk (Adult)   Skin Health and Integrity making progress toward outcome

## 2017-11-23 NOTE — Unmapped (Signed)
Medicine Daily Progress Note    Interval Events/Subjective:   NAEON.  Patient feeling well today, with family at bedside.  No acute complaints. Asking questions regarding chemo regimen and progression of his disease today. Started posaconazole 300 mg BID today, then 300 mg daily starting tomorrow.  His appetite has improved. PT/OT rec 5x Low.  SNF placement pending.     Assessment/Plan:  Principal Problem:    Acute myeloid leukemia (AML), M5 (CMS-HCC)  Active Problems:    Coronary atherosclerosis of native coronary artery    Failure to thrive in adult  Resolved Problems:    * No resolved hospital problems. *   Malnutrition Evaluation as performed by RD, LDN: Severe Protein-Calorie Malnutrition in the context of chronic illness (11/12/17 0910)     Arthur Bates is a 72 y.o. male who presented to [facility] with Acute myeloid leukemia (AML), M5 (CMS-HCC).    Acute Myeloid Leukemia: Diagnosed in October 2018. Flow- 39% blasts- CD34+ (subset), CD13, CD15, CD7, CD11c ,CD33, CD117, HLA-DR. Normal cytogenetics and no molecular mutations assessed. Hyperleukocytosis to 28,000 with 10% relative blasts on diff. Patient reported low grade fevers at home. Now through cycle two of azacitadine and successful titration of venetoclax without evidence of tumor lysis.  -- Allopurinol 300 mg daily  -- Venetoclax 400 mg  -- Stopped micafungin and started posaconazole 300 mg BID today, then decrease to 300 mg daily tomorrow.  (once started will titrate venetoclax down 2/2 posa increasing therapeutic levels)  -- Acyclovir prophylaxis  -- Levofloxacin per below  -- Tumor lysis labs      Hypoactive behavior: Patient has been somewhat sleepy on rounds, and though he has risk factors for it, is without inattention or waxing/waning to suggest delirium. Mood seems down, understandably, but unclear what role this is playing. Cefepime neurotoxicity is a consideration given apparent new onset since admission. No focal neurologic deficits to suggest structural issue but patient with known malignancy and probable bleeding diathesis, makes this a possibility.  MRI brain on 1/5 did not reveal acute intracranial process.  We will obtain LP to rule out leukemia and CSF to complete workup. LP done, CSF without acute findings or concern for CNS leukemia. Patient improved significantly since increasing remeron  - Remeron 15 mg qHs  ??  Cough: Patient describes cough productive of clear sputum, intermittently for 1 month. Emphysema noted on prior CT chest at outside facility, but images not in system. CXR in ED negative for acute airspace disease.  On 12/30 PM reported cough worsened.  CT chest w/o on 11/15/2018 showed bibasilar subpleural opacities, likely fibrosis. No consolidations or discrete nodules to suggest infection.    Fever: Hemodynamically stable without a fever since 1/1 but still severely neutropenic. Concern re possible neurotoxicity from cefepime so will transition to levofloxacin given stability from an infectious standpoint.  - BCx's NGTD  - Stopped cefepime 1/4  - Start levofloxacin 500 mg daily (1/5 - )  ??  Failure to Thrive, Severe protein calorie malnutrition: Patient reports 20 lb weight loss in approximately 2 weeks. He has poor appetite, worsened by his medications, and difficulty swallowing. Denies any n/v.  Appetite improving.   - SLP consult  - Nutrition consult   - Encourage Ensure intake  - Remeron 15 mg qhs  ??  Near-falls, gait instability: Patient reports feeling unsteady on his feet. Has not fallen at home but describes pins and needles sensation in the lateral four toes of his left foot. Denies leg weakness.  No history of diabetes. No B12 deficiency.  - PT/OT- recommend 3x weekly HH, will consult for reevaluation as his functional status has declined since last evaluation.  ??  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 No recent echo available.   - Holding lipitor   ??  GERD:  Continue home famotidine  ??  Code Status: Full Code  ___________________________________________________________________    Labs/Studies:  Labs and Studies from the last 24hrs per EMR and Reviewed    Pressure Ulcer(s)    Active Pressure Ulcer     None              Objective:  Temp:  [36.7 ??C-37.1 ??C] 36.9 ??C  Heart Rate:  [76-94] 76  Resp:  [16-18] 18  BP: (103-179)/(53-83) 118/83  SpO2:  [98 %-100 %] 100 %    GEN: NAD, sitting up in bed, much more alert today and conversant.  ENT: MMM  CV: RRR, no murmur or rubs  PULM: CTAB, normal work of breathing, on room air  ABD: soft, flat, NT/ND, +BS  EXT: No edema. Mild wasting.  Neuro: no focal deficits, face is symmetric. A&Ox3

## 2017-11-24 LAB — MAGNESIUM
MAGNESIUM: 1.8 mg/dL (ref 1.6–2.2)
Magnesium:MCnc:Pt:Ser/Plas:Qn:: 1.7
Magnesium:MCnc:Pt:Ser/Plas:Qn:: 1.8

## 2017-11-24 LAB — CBC W/ AUTO DIFF
HEMATOCRIT: 21.5 % — ABNORMAL LOW (ref 41.0–53.0)
HEMOGLOBIN: 7.4 g/dL — ABNORMAL LOW (ref 13.5–17.5)
MEAN CORPUSCULAR HEMOGLOBIN CONC: 34.6 g/dL (ref 31.0–37.0)
MEAN CORPUSCULAR HEMOGLOBIN: 30.4 pg (ref 26.0–34.0)
PLATELET COUNT: 63 10*9/L — ABNORMAL LOW (ref 150–440)
RED BLOOD CELL COUNT: 2.45 10*12/L — ABNORMAL LOW (ref 4.50–5.90)

## 2017-11-24 LAB — COMPREHENSIVE METABOLIC PANEL
ALBUMIN: 2.6 g/dL — ABNORMAL LOW (ref 3.5–5.0)
ALKALINE PHOSPHATASE: 121 U/L (ref 38–126)
ALT (SGPT): 32 U/L (ref 19–72)
ANION GAP: 3 mmol/L — ABNORMAL LOW (ref 9–15)
AST (SGOT): 20 U/L (ref 19–55)
BILIRUBIN TOTAL: 1.2 mg/dL (ref 0.0–1.2)
BUN / CREAT RATIO: 28
CALCIUM: 8.9 mg/dL (ref 8.5–10.2)
CHLORIDE: 103 mmol/L (ref 98–107)
CO2: 31 mmol/L — ABNORMAL HIGH (ref 22.0–30.0)
EGFR MDRD AF AMER: >=60 mL/min/{1.73_m2} (ref >=60)
EGFR MDRD NON AF AMER: >=60 mL/min/{1.73_m2} (ref >=60)
GLUCOSE RANDOM: 96 mg/dL (ref 65–179)
POTASSIUM: 4.2 mmol/L (ref 3.5–5.0)
PROTEIN TOTAL: 5.1 g/dL — ABNORMAL LOW (ref 6.5–8.3)
SODIUM: 137 mmol/L (ref 135–145)

## 2017-11-24 LAB — POTASSIUM: Potassium:SCnc:Pt:Ser/Plas:Qn:: 4.7

## 2017-11-24 LAB — MANUAL DIFFERENTIAL
EOSINOPHILS - ABS (DIFF): 0 10*9/L (ref 0.0–0.4)
LYMPHOCYTES - ABS (DIFF): 0.4 10*9/L — ABNORMAL LOW (ref 1.5–5.0)
NEUTROPHILS - ABS (DIFF): 0.1 10*9/L — CL (ref 2.0–7.5)

## 2017-11-24 LAB — WBC ADJUSTED: Lab: 0.5 — ABNORMAL LOW

## 2017-11-24 LAB — PHOSPHORUS
Phosphate:MCnc:Pt:Ser/Plas:Qn:: 4.5
Phosphate:MCnc:Pt:Ser/Plas:Qn:: 4.8 — ABNORMAL HIGH

## 2017-11-24 LAB — LYMPHOCYTES - ABS (DIFF): Lab: 0.4 — ABNORMAL LOW

## 2017-11-24 LAB — LACTATE DEHYDROGENASE
LACTATE DEHYDROGENASE: 337 U/L — ABNORMAL LOW (ref 338–610)
Lactate dehydrogenase:CCnc:Pt:Ser/Plas:Qn:: 335 — ABNORMAL LOW
Lactate dehydrogenase:CCnc:Pt:Ser/Plas:Qn:: 337 — ABNORMAL LOW

## 2017-11-24 LAB — URIC ACID
Urate:MCnc:Pt:Ser/Plas:Qn:: 2.4 — ABNORMAL LOW
Urate:MCnc:Pt:Ser/Plas:Qn:: 2.8 — ABNORMAL LOW

## 2017-11-24 LAB — AST (SGOT): Aspartate aminotransferase:CCnc:Pt:Ser/Plas:Qn:: 20

## 2017-11-24 NOTE — Unmapped (Signed)
Medicine Daily Progress Note    Interval Events/Subjective:   NAEON.  No acute complaints this morning, feeling well.  Posaconazole 300 mg daily starting tomorrow.  Appetite improving, wanting to have potato soup for lunch today.  Will increase his bowel regimen.  PT/OT rec 5x Low.  SNF placement pending.     Assessment/Plan:  Principal Problem:    Acute myeloid leukemia (AML), M5 (CMS-HCC)  Active Problems:    Coronary atherosclerosis of native coronary artery    Failure to thrive in adult  Resolved Problems:    * No resolved hospital problems. *   Malnutrition Evaluation as performed by RD, LDN: Severe Protein-Calorie Malnutrition in the context of chronic illness (11/12/17 0910)     Arthur Bates is a 72 y.o. male who presented to [facility] with Acute myeloid leukemia (AML), M5 (CMS-HCC).    Acute Myeloid Leukemia: Diagnosed in October 2018. Flow- 39% blasts- CD34+ (subset), CD13, CD15, CD7, CD11c ,CD33, CD117, HLA-DR. Normal cytogenetics and no molecular mutations assessed. Hyperleukocytosis to 28,000 with 10% relative blasts on diff. Patient reported low grade fevers at home. Now through cycle two of azacitadine and successful titration of venetoclax without evidence of tumor lysis.  -- Allopurinol 300 mg daily  -- Venetoclax 400 mg  -- posaconazole 300 mg daily (once started will titrate venetoclax down 2/2 posa increasing therapeutic levels)   -- Acyclovir prophylaxis  -- Levofloxacin per below  -- Tumor lysis labs    -- Bowel regimen: Miralax x 1 today, senna BID, miralax as needed    Hypoactive behavior: Patient has been somewhat sleepy on rounds, and though he has risk factors for it, is without inattention or waxing/waning to suggest delirium. Mood seems down, understandably, but unclear what role this is playing. Cefepime neurotoxicity is a consideration given apparent new onset since admission. No focal neurologic deficits to suggest structural issue but patient with known malignancy and probable bleeding diathesis, makes this a possibility.  MRI brain on 1/5 did not reveal acute intracranial process.  We will obtain LP to rule out leukemia and CSF to complete workup. LP done, CSF without acute findings or concern for CNS leukemia. Patient improved significantly since increasing remeron.  - Remeron 15 mg qHs  ??  Cough: Patient describes cough productive of clear sputum, intermittently for 1 month. Emphysema noted on prior CT chest at outside facility, but images not in system. CXR in ED negative for acute airspace disease.  On 12/30 PM reported cough worsened.  CT chest w/o on 11/15/2018 showed bibasilar subpleural opacities, likely fibrosis. No consolidations or discrete nodules to suggest infection.    Fever: Hemodynamically stable without a fever since 1/1 but still severely neutropenic. Concern re possible neurotoxicity from cefepime so will transition to levofloxacin given stability from an infectious standpoint.  - BCx's NGTD  - Stopped cefepime 1/4  - Start levofloxacin 500 mg daily (1/5 - )  ??  Failure to Thrive, Severe protein calorie malnutrition: Patient reports 20 lb weight loss in approximately 2 weeks. He has poor appetite, worsened by his medications, and difficulty swallowing. Denies any n/v.  Appetite improving.   - SLP consult  - Nutrition consult   - Encourage Ensure intake  - Remeron 15 mg qhs  ??  Near-falls, gait instability: Patient reports feeling unsteady on his feet. Has not fallen at home but describes pins and needles sensation in the lateral four toes of his left foot. Denies leg weakness. No history of diabetes. No B12 deficiency.  -  PT/OT- recommend 5x low, SNF placement pending.   ??  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 No recent echo available.   - Holding lipitor   ??  GERD:  Continue home famotidine  ??  Code Status: Full Code  ___________________________________________________________________    Labs/Studies:  Labs and Studies from the last 24hrs per EMR and Reviewed    Pressure Ulcer(s)    Active Pressure Ulcer     None              Objective:  Temp:  [36.6 ??C-37.2 ??C] 36.7 ??C  Heart Rate:  [76-84] 77  Resp:  [18] 18  BP: (102-116)/(53-70) 106/58  SpO2:  [98 %-100 %] 100 %    GEN: NAD, sitting up in bed, much more alert today and conversant.  ENT: MMM  CV: RRR, no murmur or rubs  PULM: CTAB, normal work of breathing, on room air  ABD: soft, flat, NT/ND, +BS  EXT: No edema. Mild wasting.  Neuro: no focal deficits, face is symmetric. A&Ox3

## 2017-11-24 NOTE — Unmapped (Signed)
Problem: Patient Care Overview  Goal: Plan of Care Review  Outcome: Progressing  Pt alert and oriented. VSS, no falls and afebrile.  No acute events overnight. Pt denies pain.  Pt remains on room air.  Pt has an area on sacrum that is blanchable,  Sacral meplex in place and pt encouraged to turn frequently.  Pt is anxious to go home to be with his family.  Pt states he would like to work with PT/OT daily to regain strength while awaiting placement and hopefully regain enough strength to go home instead of a SNIF.  Safety measures remain in place with bed alarm on.  Will continue to monitor.    Vitals:    11/23/17 1550 11/23/17 1915 11/24/17 0004 11/24/17 0512   BP: 108/61 116/70 109/61 102/53   Pulse: 83 76 78 84   Resp: 18 18 18 18    Temp: 37 ??C (98.6 ??F) 37 ??C (98.6 ??F) 36.6 ??C (97.9 ??F) 37.2 ??C (99 ??F)   TempSrc: Oral Oral Oral Oral   SpO2: 100% 100% 99% 98%   Weight:  66.7 kg (147 lb)     Height:         Goal: Individualization and Mutuality  Outcome: Progressing    Goal: Discharge Needs Assessment  Outcome: Progressing      Problem: Fall Risk (Adult)  Goal: Identify Related Risk Factors and Signs and Symptoms  Related risk factors and signs and symptoms are identified upon initiation of Human Response Clinical Practice Guideline (CPG).   Outcome: Progressing    Goal: Absence of Fall  Patient will demonstrate the desired outcomes by discharge/transition of care.   Outcome: Progressing      Problem: VTE, DVT and PE (Adult)  Goal: Signs and Symptoms of Listed Potential Problems Will be Absent, Minimized or Managed (VTE, DVT and PE)  Signs and symptoms of listed potential problems will be absent, minimized or managed by discharge/transition of care (reference VTE, DVT and PE (Adult) CPG).   Outcome: Progressing      Problem: Oncology Care (Adult)  Goal: Signs and Symptoms of Listed Potential Problems Will be Absent, Minimized or Managed (Oncology Care)  Signs and symptoms of listed potential problems will be absent, minimized or managed by discharge/transition of care (reference Oncology Care (Adult) CPG).   Outcome: Progressing      Problem: Skin Injury Risk (Adult)  Goal: Identify Related Risk Factors and Signs and Symptoms  Related risk factors and signs and symptoms are identified upon initiation of Human Response Clinical Practice Guideline (CPG).   Outcome: Progressing    Goal: Skin Health and Integrity  Patient will demonstrate the desired outcomes by discharge/transition of care.   Outcome: Progressing

## 2017-11-25 DIAGNOSIS — H61329 Acquired stenosis of external ear canal secondary to inflammation and infection, unspecified ear: Secondary | ICD-10-CM | POA: Diagnosis not present

## 2017-11-25 DIAGNOSIS — R05 Cough: Secondary | ICD-10-CM | POA: Diagnosis not present

## 2017-11-25 DIAGNOSIS — I714 Abdominal aortic aneurysm, without rupture: Secondary | ICD-10-CM | POA: Diagnosis not present

## 2017-11-25 DIAGNOSIS — M19041 Primary osteoarthritis, right hand: Secondary | ICD-10-CM | POA: Diagnosis not present

## 2017-11-25 DIAGNOSIS — D709 Neutropenia, unspecified: Secondary | ICD-10-CM | POA: Diagnosis not present

## 2017-11-25 DIAGNOSIS — Z955 Presence of coronary angioplasty implant and graft: Secondary | ICD-10-CM | POA: Diagnosis not present

## 2017-11-25 DIAGNOSIS — D6189 Other specified aplastic anemias and other bone marrow failure syndromes: Secondary | ICD-10-CM | POA: Diagnosis not present

## 2017-11-25 DIAGNOSIS — E119 Type 2 diabetes mellitus without complications: Secondary | ICD-10-CM | POA: Diagnosis not present

## 2017-11-25 DIAGNOSIS — Z881 Allergy status to other antibiotic agents status: Secondary | ICD-10-CM | POA: Diagnosis not present

## 2017-11-25 DIAGNOSIS — I252 Old myocardial infarction: Secondary | ICD-10-CM | POA: Diagnosis not present

## 2017-11-25 DIAGNOSIS — C9210 Chronic myeloid leukemia, BCR/ABL-positive, not having achieved remission: Secondary | ICD-10-CM | POA: Diagnosis not present

## 2017-11-25 DIAGNOSIS — Z888 Allergy status to other drugs, medicaments and biological substances status: Secondary | ICD-10-CM | POA: Diagnosis not present

## 2017-11-25 DIAGNOSIS — R42 Dizziness and giddiness: Secondary | ICD-10-CM | POA: Diagnosis not present

## 2017-11-25 DIAGNOSIS — E876 Hypokalemia: Secondary | ICD-10-CM | POA: Diagnosis not present

## 2017-11-25 DIAGNOSIS — R293 Abnormal posture: Secondary | ICD-10-CM | POA: Diagnosis not present

## 2017-11-25 DIAGNOSIS — R112 Nausea with vomiting, unspecified: Secondary | ICD-10-CM | POA: Diagnosis not present

## 2017-11-25 DIAGNOSIS — E7849 Other hyperlipidemia: Secondary | ICD-10-CM | POA: Diagnosis not present

## 2017-11-25 DIAGNOSIS — Z79899 Other long term (current) drug therapy: Secondary | ICD-10-CM | POA: Diagnosis not present

## 2017-11-25 DIAGNOSIS — M6281 Muscle weakness (generalized): Secondary | ICD-10-CM | POA: Diagnosis not present

## 2017-11-25 DIAGNOSIS — H9193 Unspecified hearing loss, bilateral: Secondary | ICD-10-CM | POA: Diagnosis not present

## 2017-11-25 DIAGNOSIS — K219 Gastro-esophageal reflux disease without esophagitis: Secondary | ICD-10-CM | POA: Diagnosis not present

## 2017-11-25 DIAGNOSIS — M19042 Primary osteoarthritis, left hand: Secondary | ICD-10-CM | POA: Diagnosis not present

## 2017-11-25 DIAGNOSIS — Z86010 Personal history of colonic polyps: Secondary | ICD-10-CM | POA: Diagnosis not present

## 2017-11-25 DIAGNOSIS — R627 Adult failure to thrive: Secondary | ICD-10-CM | POA: Diagnosis not present

## 2017-11-25 DIAGNOSIS — E785 Hyperlipidemia, unspecified: Secondary | ICD-10-CM | POA: Diagnosis not present

## 2017-11-25 DIAGNOSIS — Z91013 Allergy to seafood: Secondary | ICD-10-CM | POA: Diagnosis not present

## 2017-11-25 DIAGNOSIS — R531 Weakness: Secondary | ICD-10-CM | POA: Diagnosis not present

## 2017-11-25 DIAGNOSIS — R0602 Shortness of breath: Secondary | ICD-10-CM | POA: Diagnosis not present

## 2017-11-25 DIAGNOSIS — Z87442 Personal history of urinary calculi: Secondary | ICD-10-CM | POA: Diagnosis not present

## 2017-11-25 DIAGNOSIS — D61818 Other pancytopenia: Secondary | ICD-10-CM | POA: Diagnosis not present

## 2017-11-25 DIAGNOSIS — R5383 Other fatigue: Secondary | ICD-10-CM | POA: Diagnosis not present

## 2017-11-25 DIAGNOSIS — R5081 Fever presenting with conditions classified elsewhere: Secondary | ICD-10-CM | POA: Diagnosis not present

## 2017-11-25 DIAGNOSIS — Z9889 Other specified postprocedural states: Secondary | ICD-10-CM | POA: Diagnosis not present

## 2017-11-25 DIAGNOSIS — C9300 Acute monoblastic/monocytic leukemia, not having achieved remission: Secondary | ICD-10-CM | POA: Diagnosis not present

## 2017-11-25 DIAGNOSIS — Z5111 Encounter for antineoplastic chemotherapy: Secondary | ICD-10-CM | POA: Diagnosis not present

## 2017-11-25 DIAGNOSIS — K56601 Complete intestinal obstruction, unspecified as to cause: Secondary | ICD-10-CM | POA: Diagnosis not present

## 2017-11-25 DIAGNOSIS — I10 Essential (primary) hypertension: Secondary | ICD-10-CM | POA: Diagnosis not present

## 2017-11-25 DIAGNOSIS — Z9221 Personal history of antineoplastic chemotherapy: Secondary | ICD-10-CM | POA: Diagnosis not present

## 2017-11-25 DIAGNOSIS — Z87891 Personal history of nicotine dependence: Secondary | ICD-10-CM | POA: Diagnosis not present

## 2017-11-25 DIAGNOSIS — R278 Other lack of coordination: Secondary | ICD-10-CM | POA: Diagnosis not present

## 2017-11-25 DIAGNOSIS — C9200 Acute myeloblastic leukemia, not having achieved remission: Secondary | ICD-10-CM | POA: Diagnosis not present

## 2017-11-25 DIAGNOSIS — D518 Other vitamin B12 deficiency anemias: Secondary | ICD-10-CM | POA: Diagnosis not present

## 2017-11-25 DIAGNOSIS — E860 Dehydration: Secondary | ICD-10-CM | POA: Diagnosis not present

## 2017-11-25 DIAGNOSIS — M1710 Unilateral primary osteoarthritis, unspecified knee: Secondary | ICD-10-CM | POA: Diagnosis not present

## 2017-11-25 DIAGNOSIS — R1310 Dysphagia, unspecified: Secondary | ICD-10-CM | POA: Diagnosis not present

## 2017-11-25 DIAGNOSIS — I2510 Atherosclerotic heart disease of native coronary artery without angina pectoris: Secondary | ICD-10-CM | POA: Diagnosis not present

## 2017-11-25 LAB — URIC ACID: Urate:MCnc:Pt:Ser/Plas:Qn:: 2.8 — ABNORMAL LOW

## 2017-11-25 LAB — CBC W/ AUTO DIFF
HEMATOCRIT: 20.3 % — ABNORMAL LOW (ref 41.0–53.0)
HEMOGLOBIN: 7 g/dL — ABNORMAL LOW (ref 13.5–17.5)
MEAN CORPUSCULAR HEMOGLOBIN CONC: 34.6 g/dL (ref 31.0–37.0)
MEAN PLATELET VOLUME: 10.5 fL — ABNORMAL HIGH (ref 7.0–10.0)
PLATELET COUNT: 63 10*9/L — ABNORMAL LOW (ref 150–440)
RED BLOOD CELL COUNT: 2.33 10*12/L — ABNORMAL LOW (ref 4.50–5.90)
RED CELL DISTRIBUTION WIDTH: 13.7 % (ref 12.0–15.0)
WBC ADJUSTED: 0.6 10*9/L — ABNORMAL LOW (ref 4.5–11.0)

## 2017-11-25 LAB — COMPREHENSIVE METABOLIC PANEL
ALBUMIN: 2.7 g/dL — ABNORMAL LOW (ref 3.5–5.0)
ALKALINE PHOSPHATASE: 118 U/L (ref 38–126)
ALT (SGPT): 38 U/L (ref 19–72)
ANION GAP: 5 mmol/L — ABNORMAL LOW (ref 9–15)
AST (SGOT): 24 U/L (ref 19–55)
BILIRUBIN TOTAL: 1.4 mg/dL — ABNORMAL HIGH (ref 0.0–1.2)
BLOOD UREA NITROGEN: 22 mg/dL — ABNORMAL HIGH (ref 7–21)
BUN / CREAT RATIO: 27
CALCIUM: 8.7 mg/dL (ref 8.5–10.2)
CHLORIDE: 99 mmol/L (ref 98–107)
CO2: 31 mmol/L — ABNORMAL HIGH (ref 22.0–30.0)
CREATININE: 0.82 mg/dL (ref 0.70–1.30)
EGFR MDRD AF AMER: >=60 mL/min/{1.73_m2} (ref >=60)
EGFR MDRD NON AF AMER: >=60 mL/min/{1.73_m2} (ref >=60)
GLUCOSE RANDOM: 93 mg/dL (ref 65–179)
POTASSIUM: 4.3 mmol/L (ref 3.5–5.0)
PROTEIN TOTAL: 5.1 g/dL — ABNORMAL LOW (ref 6.5–8.3)

## 2017-11-25 LAB — MEAN CORPUSCULAR HEMOGLOBIN CONC: Lab: 34.6

## 2017-11-25 LAB — MANUAL DIFFERENTIAL
BASOPHILS - ABS (DIFF): 0 10*9/L (ref 0.0–0.1)
EOSINOPHILS - ABS (DIFF): 0 10*9/L (ref 0.0–0.4)
NEUTROPHILS - ABS (DIFF): 0.1 10*9/L — CL (ref 2.0–7.5)

## 2017-11-25 LAB — LACTATE DEHYDROGENASE: Lactate dehydrogenase:CCnc:Pt:Ser/Plas:Qn:: 346

## 2017-11-25 LAB — MAGNESIUM: Magnesium:MCnc:Pt:Ser/Plas:Qn:: 1.8

## 2017-11-25 LAB — CMV IGG: Lab: NEGATIVE

## 2017-11-25 LAB — NEUTROPHILS - ABS (DIFF): Lab: 0.1 — CL

## 2017-11-25 LAB — PHOSPHORUS: Phosphate:MCnc:Pt:Ser/Plas:Qn:: 4.6

## 2017-11-25 LAB — ALKALINE PHOSPHATASE: Alkaline phosphatase:CCnc:Pt:Ser/Plas:Qn:: 118

## 2017-11-25 MED ORDER — FAMOTIDINE 20 MG TABLET
ORAL_TABLET | Freq: Two times a day (BID) | ORAL | 0 refills | 0.00000 days
Start: 2017-11-25 — End: 2017-12-25

## 2017-11-25 MED ORDER — SENNOSIDES 8.6 MG TABLET
ORAL_TABLET | Freq: Two times a day (BID) | ORAL | 0 refills | 0 days
Start: 2017-11-25 — End: 2017-12-25

## 2017-11-25 MED ORDER — VENETOCLAX 100 MG TABLET
ORAL_TABLET | Freq: Every day | ORAL | 6 refills | 0.00000 days
Start: 2017-11-25 — End: ?

## 2017-11-25 MED ORDER — MIRTAZAPINE 15 MG TABLET
ORAL_TABLET | Freq: Every evening | ORAL | 3 refills | 0.00000 days
Start: 2017-11-25 — End: 2017-12-25

## 2017-11-25 MED ORDER — ALLOPURINOL 300 MG TABLET
ORAL_TABLET | Freq: Every day | ORAL | 0 refills | 0 days
Start: 2017-11-25 — End: 2018-11-25

## 2017-11-25 NOTE — Unmapped (Signed)
Problem: Patient Care Overview  Goal: Plan of Care Review  Outcome: Progressing  Pt alert and oriented. VSS, no falls and afebrile.  No acute events overnight. Pt c/o pain on his lower back.  Pt states it has been bothering him since his procedure, refused pain medication.  Pt encouraged to shift his position often to his sides instead of staying on his back.  Sacrum blanchable with meplex in place, pt refused zinc skin protectant cream.  Pt would like to return home with home heath instead of going to a SNIF if at all possible.  Pt remains on room air.  Safety measures remain in place with bed alarm on.  Will continue to monitor.    Vitals:    11/24/17 1918 11/24/17 2110 11/24/17 2342 11/25/17 0330   BP: 113/62  114/55 106/56   Pulse: 79  73 73   Resp: 18  18 18    Temp: 37.2 ??C (99 ??F)  37.2 ??C (99 ??F) 37.3 ??C (99.1 ??F)   TempSrc: Oral  Oral Oral   SpO2: 100%  98% 99%   Weight:  66 kg (145 lb 8.1 oz)     Height:         Goal: Individualization and Mutuality  Outcome: Progressing    Goal: Discharge Needs Assessment  Outcome: Progressing    Goal: Interprofessional Rounds/Family Conf  Outcome: Progressing      Problem: Fall Risk (Adult)  Goal: Identify Related Risk Factors and Signs and Symptoms  Related risk factors and signs and symptoms are identified upon initiation of Human Response Clinical Practice Guideline (CPG).   Outcome: Progressing    Goal: Absence of Fall  Patient will demonstrate the desired outcomes by discharge/transition of care.   Outcome: Progressing      Problem: VTE, DVT and PE (Adult)  Goal: Signs and Symptoms of Listed Potential Problems Will be Absent, Minimized or Managed (VTE, DVT and PE)  Signs and symptoms of listed potential problems will be absent, minimized or managed by discharge/transition of care (reference VTE, DVT and PE (Adult) CPG).   Outcome: Progressing      Problem: Oncology Care (Adult)  Goal: Signs and Symptoms of Listed Potential Problems Will be Absent, Minimized or Managed (Oncology Care)  Signs and symptoms of listed potential problems will be absent, minimized or managed by discharge/transition of care (reference Oncology Care (Adult) CPG).   Outcome: Progressing      Problem: Skin Injury Risk (Adult)  Goal: Identify Related Risk Factors and Signs and Symptoms  Related risk factors and signs and symptoms are identified upon initiation of Human Response Clinical Practice Guideline (CPG).   Outcome: Progressing    Goal: Skin Health and Integrity  Patient will demonstrate the desired outcomes by discharge/transition of care.   Outcome: Progressing

## 2017-11-25 NOTE — Unmapped (Signed)
Problem: Patient Care Overview  Goal: Plan of Care Review  Outcome: Progressing  Pt A&OX4 with intermittent forgetfulness. No acute events this shift. VSS, afebrile. See flowsheet for assessment.  Very minimal food intake today (cup of soup). Repositions independently. Slept most of the  evening. No falls or injuries    Problem: Fall Risk (Adult)  Goal: Identify Related Risk Factors and Signs and Symptoms  Related risk factors and signs and symptoms are identified upon initiation of Human Response Clinical Practice Guideline (CPG).   Outcome: Progressing    Goal: Absence of Fall  Patient will demonstrate the desired outcomes by discharge/transition of care.   Outcome: Progressing      Problem: VTE, DVT and PE (Adult)  Goal: Signs and Symptoms of Listed Potential Problems Will be Absent, Minimized or Managed (VTE, DVT and PE)  Signs and symptoms of listed potential problems will be absent, minimized or managed by discharge/transition of care (reference VTE, DVT and PE (Adult) CPG).   Outcome: Progressing      Problem: Skin Injury Risk (Adult)  Goal: Identify Related Risk Factors and Signs and Symptoms  Related risk factors and signs and symptoms are identified upon initiation of Human Response Clinical Practice Guideline (CPG).   Outcome: Progressing

## 2017-11-25 NOTE — Unmapped (Signed)
Pharmacy Transitions of Care Discharge Note  Arthur Bates is a 72 y.o. male with CAD (prior MI 11/01/09), GERD, HTN who presented with recently diagnosed AML and failure to thrive. He received one cycle of azacitidine prior to admission at an OSH and then received C2 of azacitidine with the addition of venetoclax during the hospitalization. Discharge date = 11/25/17. The following is a summary of medication-related changes that occurred during admission, as well as any pharmacy-related follow-up and medication access needs.     AML:  - s/p treatment with azacitdine + venetoclax 400 mg PO daily , C2D1 = 12/28; venetoclax ramp-up D1 = 1/2. Of note, the venetoclax dose was reduced from 400 mg PO daily to 100 mg PO daily on discharge following the additional of posaconazole (major 3A4 inhibitor).   - Discharged on allopurinol 300 mg daily, levofloxacin 500 mg PO daily, acyclovir 400 mg PO BID, and posaconazole 300 mg PO daily for prophylaxis.    Other Medication Changes:  - Started mirtazapine 15 mg nightly  - Changed home fluconazole to posaconazole 300 mg daily  - Changed ranitdine to famotidine 20 mg BID  - Home atorvastatin discontinued due to DDI with posaconazole    Appointments and monitoring:  Future Appointments  Date Time Provider Department Center   11/26/2017 10:30 AM [facility] ONC PERIPHERAL LAB [facility]   11/29/2017 9:00 AM Richard Tomma Rakers, MD [facility]   12/03/2017 11:30 AM ADULT ONC LAB [facility]   12/03/2017 12:30 PM Vernie Murders, AGNP [id number]   12/03/2017 1:30 PM ONCINF CHAIR 41 [id number]   12/07/2017 10:00 AM Richard Tomma Rakers, MD [facility]   12/09/2017 3:00 PM ADULT ONC LAB [facility]   12/09/2017 4:00 PM Avie Arenas, MD [id number]   12/13/2017 9:45 AM [facility] ONC PERIPHERAL LAB [facility]   12/27/2017 9:30 AM [facility] ON[facility] PERIPHERAL LAB [facility]RAW [facility]   01/10/2018 9:30 AM [facility] ONC PERIPHERAL LAB [facility]RAW [facility]   01/24/2018 9:30 AM [facility] ONC PERIPHERAL LAB [facility]RAW [facility]       Medication Access:  - Pharmacy insurance: [employer] Medicare ADV  - Preferred pharmacy: [facility] and [facility]    Outpatient Pharmacy Follow-Up:   [ ]  Medication management - Posaconazole copay $313, filled at [facility] for the first fill with A/R account. Referral submitted for financial assistance for subsequent fills. Please follow-up status of [facility] referral. Additionally, venetoclax was dose reduced to 100 mg PO daily due to DDI with posaconazole. Rx will likely need to be updated with current dose if posaconazole is continued (pending recovery of neutrophil count). Please review where posaconazole (COP) and venetoclax ([facility]) prescriptions will be refilled with patient and his wife, as they were concerned about these medication refills being missed with his discharge to SNF. Instructions were provided at time of discharge regarding refill information but will likely need reinforcement. He was sent to the SNF with supply of both medications in hand at time of discharge.   [ ]  Posaconazole level - Consider posaconazole level at next clinic visit depending on time of lab appointment. The patient had not yet reached Css prior to discharge.     Please page with questions,  Barbette Hair PharmD, PGY1 Resident  [phone]    I was the precepting pharmacist in the delivery of services. I agree with  the plan as documented.    Clemens Catholic, PharmD, BCPS, BCOP  Clinical Pharmacy Specialist, Malignant Hematology  Pager [phone]

## 2017-11-25 NOTE — Unmapped (Signed)
Physician Discharge Summary    Identifying Information:   Arthur Bates  03-27-1946  [id number]    Admit date: 11/11/2017    Discharge date: 11/25/2017     Discharge Service: Oncology/Hematology (MDE)    Discharge Attending Physician: Providence Crosby Dittus, MD    Discharge to: Home    Discharge Diagnoses:  Principal Problem:    Acute myeloid leukemia (AML), M5 (CMS-HCC)  Active Problems:    Coronary atherosclerosis of native coronary artery    Failure to thrive in adult  Resolved Problems:    * No resolved hospital problems. [facility] Course:   Mr. Arthur Bates is a 72 year old male with AML who presented to [facility] on [facility]/11/2017 for cycle 2 of Azacitadine, as well as dehydration, poor PO intake, weight loss.  The patient was s/p one cycle of Azacytidine on presentation, with the second cycle delayed for almost a month due to functional decline, likely due to untreated AML.      AML: The patient was diagnosed in October of 2018, Flow- 39% blasts- CD34+ (subset), CD13, CD15, CD7, CD11c ,CD33, CD117, HLA-DR. Normal cytogenetics and no molecular mutations assessed. The patient was initially treated with Hydrea for WBC > 10,000.  Cycle 2 of Azacitidine was started on 11/12/2017.  Azacitidine completed on 11/18/2017. He was then ramped up with venetoclax to 400 mg starting 1/4.  On 1/8, started on posaconazole for ppx, and venetoclax dose decreased to 100 mg daily on 1/10 prior to discharge.  He is continued on levaquin, posaconazole, and acyclovir ppx.     Cough: patient reported a productive, intermittent cough for about one month prior to his presentation.  Repeat CT chest on 11/15/2017 showed bibasilar subplerual opacities, likely fibrosis.  No consolidations or discrete nodules to suggest infection.     Neutropenic Fever: Blood cultures negative.  Treated with IV Cefepime from 12/31 - 1/4 and transitioned to Levaquin on 1/4- 1/10 without recurring fevers.     Dehydration, Malnutrition: Patient reported he had lost 20 pounds in about two weeks.  He had poor appetite but no dysphagia and no nausea or vomiting. On presentation lactate elevated to 2.3, which resolved with IV fluids.  Nutrition was consulted.  Remeron 7.5 mg nightly was started for appetite stimulation. Patient continued to functionally decline with poor appetite so Remeron increased to 15 mg with good results. Given his functional decline while hospitalized an MRI brain was obtained without evidence of intracranial abnormalities.  To complete workup we obtained LP to rule out CNS leukemia, which was negative.   His appetite began to improve by time of discharge.     Constipation: Patient has 1-2 BM's per week at baseline.  Has not had BM for 4 days prior to discharge.   Increased bowel regimen with scheduled miralax and senna BID.  Will need to continue to monitor this at outside facility.     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  Lipitor was held during this admission.    Post Discharge Follow Up Issues:   - Monitor bowel regimen adequacy  - Venetoclax titrated to 100 mg daily since starting posaconazole on 1/8.   - Follow up with Dr. Caron Presume on 1/14  - Infusion clinic appointment 1/18 with Sawchak  - Follow up with Dr. Vertell Limber on 1/24    Procedures:  lumbar puncture  No admission procedures for hospital encounter.  _____________________________________________________________________________  Discharge Day Services:  BP 118/64  -  Pulse 76  - Temp 36.5 ??C (Oral)  - Resp 18  - Ht 176 cm (5' 9.29)  - Wt 66 kg (145 lb 8.1 oz)  - SpO2 100%  - BMI 21.31 kg/m??   Pt seen on the day of discharge and determined appropriate for discharge.    Condition at Discharge: good    Length of Discharge: I spent greater than 30 mins in the discharge of this patient.  _____________________________________________________________________________  Discharge Medications:     Your Medication List      STOP taking these medications    atorvastatin 40 MG tablet Commonly known as:  LIPITOR     fluconazole 100 MG tablet  Commonly known as:  DIFLUCAN     hydroxyurea 500 mg capsule  Commonly known as:  HYDREA     ranitidine 150 MG tablet  Commonly known as:  ZANTAC        START taking these medications    famotidine 20 MG tablet  Commonly known as:  PEPCID  Take 1 tablet (20 mg total) by mouth Two (2) times a day.     mirtazapine 15 MG tablet  Commonly known as:  REMERON  Take 1 tablet (15 mg total) by mouth nightly.     posaconazole 100 mg Tbec delayed released tablet  Commonly known as:  NOXAFIL  Take 300 mg by mouth daily.     senna 8.6 mg tablet  Commonly known as:  SENOKOT  Take 2 tablets by mouth Two (2) times a day. Hold for diarrhea.        CHANGE how you take these medications    allopurinol 300 MG tablet  Commonly known as:  ZYLOPRIM  Take 1 tablet (300 mg total) by mouth daily.  What changed:  ?? medication strength  ?? how much to take     venetoclax 100 mg tablet  Commonly known as:  VENCLEXTA  Take 1 tablet (100 mg total) by mouth daily with evening meal. Take with a meal and water. Do not chew, crush, or break tablets.  What changed:  ?? how much to take  ?? when to take this        CONTINUE taking these medications    acyclovir 400 MG tablet  Commonly known as:  ZOVIRAX  Take 400 mg by mouth Two (2) times a day.     levoFLOXacin 500 MG tablet  Commonly known as:  LEVAQUIN  Take 500 mg by mouth daily.     ondansetron 8 MG tablet  Commonly known as:  ZOFRAN  Take 1 tablet (8 mg total) by mouth every twelve (12) hours as needed for nausea.     polyethylene glycol 17 gram packet  Commonly known as:  MIRALAX  MIX 1 PACKET OF POWDER (17 GM) IN 8 OZ OF JUICE OR WATER AND DRINK BY MOUTH DAILY          _____________________________________________________________________________  Pending Test Results (if blank, then none):   Order Current Status    CMV IgG In process    HLA CL I&II, High Res In process    CBC w/ Differential Preliminary result    CBC w/ Differential Preliminary result    CSF Culture Preliminary result          Most Recent Labs:  Microbiology Results (last day)     Procedure Component Value Date/Time Date/Time    CSF Culture [[id number]] Collected:  11/21/17 1530    Lab Status:  Preliminary result Specimen:  Cerebrospinal Fluid from Lumbar Puncture Updated:  11/24/17 1339     CSF Culture NO GROWTH TO DATE     Gram Stain Result No polymorphonuclear leukocytes seen      No organisms seen    Narrative:       Specimen Source: Lumbar Puncture          Lab Results   Component Value Date    WBC 0.6 (L) 11/25/2017    HGB 7.0 (L) 11/25/2017    HCT 20.3 (L) 11/25/2017    PLT 63 (L) 11/25/2017       Lab Results   Component Value Date    NA 135 11/25/2017    K 4.3 11/25/2017    CL 99 11/25/2017    CO2 31.0 (H) 11/25/2017    BUN 22 (H) 11/25/2017    CREATININE 0.82 11/25/2017    CALCIUM 8.7 11/25/2017    MG 1.8 11/25/2017    PHOS 4.6 11/25/2017       Lab Results   Component Value Date    ALKPHOS 118 11/25/2017    BILITOT 1.4 (H) 11/25/2017    BILIDIR 0.90 (H) 11/16/2017    PROT 5.1 (L) 11/25/2017    ALBUMIN 2.7 (L) 11/25/2017    ALT 38 11/25/2017    AST 24 11/25/2017       Lab Results   Component Value Date    PT 17.3 (H) 11/11/2017    INR 1.52 11/11/2017     Hospital Radiology:  Ct Chest Wo Contrast    Result Date: 11/15/2017  EXAM: CT Chest without contrast DATE: 11/15/2017 4:35 PM ACCESSION: [id number] UN DICTATED:11/15/2017 5:13 PM INTERPRETATION LOCATION: [facility] CLINICAL INDICATION: 72 years old Male with Other-Neutropenic fever-  COMPARISON: Chest radiographs, 11/11/2017. TECHNIQUE: A spiral CT scan was obtained without IV contrast from the thoracic inlet through the hemidiaphragms. Images were reconstructed in the axial plane.  Coronal and sagittal reformatted images of the chest were also provided for further evaluation of the lung parenchyma. FINDINGS: AIRWAYS, LUNGS, PLEURA: Clear central airways. Diffuse bronchial wall thickening. Biapical pleural-parenchymal scarring. Subpleural scarring in the bases of the lingula with focus of traction bronchiectasis, as well as within the lower lobes, measuring up to 9 mm in thickness in the right lower lobe base (4:79). No lung consolidation or discrete pulmonary nodules. Trace pleural effusions (left greater than right). No pneumothoraces. MEDIASTINUM: Aortic, branch vessel, aortic valve, and coronary artery calcifications. Dilated ascending aorta, 4.0 cm (4:47). Heart mildly prominent in size (left ventricular predominance). No pericardial effusion. No lymphadenopathy by size criteria. IMAGED ABDOMEN: Partially imaged 7 mm left upper pole nonobstructing renal stone (4:96). SOFT TISSUES: Unremarkable. BONES: No suspicious osseous lesions.      -Bibasilar subpleural opacities, likely fibrosis. No consolidations or discrete nodules to suggest infection. -Trace left greater than right pleural effusions.    Mri Brain W Wo Contrast    Result Date: 11/20/2017  EXAM: Magnetic resonance imaging, brain, without and with contrast material. DATE: 11/20/2017 3:02 PM ACCESSION: [id number] UN DICTATED: 11/20/2017 3:14 PM INTERPRETATION LOCATION: [facility] CLINICAL INDICATION: 72 years old Male with weakness. On chemotherapy for acute myeloid leukemia. COMPARISON: None TECHNIQUE: Multiplanar, multisequence MR imaging of the brain was performed without and with I.V. contrast. FINDINGS: Prominent perivascular spaces in the basal ganglia and midbrain. Nonspecific moderate cerebral white matter T2 FLAIR hyperintensities, many with decreased T1 signal.   There is no midline shift or mass lesion. There is no evidence of intracranial hemorrhage or acute infarct.  There  are no extra-axial fluid collections present.  No diffusion weighted signal abnormality is identified. There is no abnormal intracranial enhancement. There is diffuse T1 hypointensity throughout the bone marrow with areas of restricted diffusion most notably involving the inferior occipital bone where there is intense enhancement and nonsuppression on the fat-saturated T2 sequence.      -No acute intracranial abnormality. -Bone marrow changes in keeping with acute myeloid leukemia.      Xr Chest 2 Views    Result Date: 11/11/2017  EXAM: XR CHEST 2 VIEWS DATE: 11/11/2017 4:55 PM ACCESSION: [id number] UN DICTATED: 11/11/2017 4:56 PM INTERPRETATION LOCATION: [facility] CLINICAL INDICATION: 72 years old Male with OTHER--  COMPARISON: None TECHNIQUE: PA and Lateral Chest Radiographs. FINDINGS: Well-inflated lungs. Radiographically clear. No focal consolidation or pulmonary edema. No pleural effusion or pneumothorax. Unremarkable cardiomediastinal silhouette. Multilevel degenerative disc disease.      No acute airspace disease.    US Abdomen Limited    Result Date: 11/16/2017  EXAM: US ABDOMEN LIMITED DATE: 11/16/2017 2:15 PM ACCESSION: [id number] UN DICTATED: 11/16/2017 2:15 PM INTERPRETATION LOCATION: [facility] CLINICAL INDICATION: 72 years old Male with Increasing bilirubin--  TECHNIQUE: Static and cine images of the right upper quadrant were performed. COMPARISON: None available. FINDINGS: LIVER: The liver is normal in echogenicity No focal hepatic lesions. No biliary ductal dilatation. GALLBLADDER: The gallbladder is physiologically distended without internal stones. Sludge noted. Sonographic Eulah Pont sign is negative.   No pericholecystic fluid. No gallbladder wall thickening. LIMITED RIGHT KIDNEY: No hydronephrosis. Small upper pole cyst, 2.0 cm.      No gallstones or other evidence of acute cholecystitis. No evidence of biliary ductal dilatation. Please see below for data measurements: Gallbladder wall max: 1.6 mm Common hepatic duct: 0.15 cm Proximal common bile duct: 0.42 cm Distal common bile duct: 0.25 cm Right kidney length: 11.5 cm Liver: 13.0 cm      _____________________________________________________________________________  Discharge Instructions:   Activity Instructions     Activity as tolerated             Diet Instructions     Discharge diet (specify)       Discharge Nutrition Therapy:  General          Other Instructions     Dressing Change: (Specify)       Dressing change: Please change sacral dressing 2 times per day using meplex .               Follow Up instructions and Outpatient Referrals     Call MD for:  difficulty breathing, headache or visual disturbances       Call MD for:  extreme fatigue       Call MD for:  hives       Call MD for:  persistent dizziness or light-headedness       Call MD for:  persistent nausea or vomiting       Call MD for:  redness, tenderness, or signs of infection (pain, swelling, redness, odor or green/yellow discharge around incision site)       Call MD for:  severe uncontrolled pain       Call MD for:  temperature >38.5 Celsius       Discharge instructions       You have been admitted to [facility] for evaluation and treatment of decreased nutrition and weakness.  It was a pleasure taking care of you.  You have shown improvement while in the hospital and will continue to improve and get stronger  at the skilled nursing facility.      Seek medical attention if you develop fever, chest pain, unexplained/unrelieved shortness of breath, any changes in your mental status, significant abdominal pain, uncontrolled nausea and vomiting such that you cannot stay hydrated or take your medications, worsening swelling, or any other concerning symptoms.  If you develop these symptoms, or if you have trouble obtaining any of your medications, you can call for appointments & questions Monday through Friday 8 AM- 5 PM  [phone] or Toll free [phone].On Nights, Weekends and Holidays Call [phone] and ask for the oncologist on call. or you can go to the [facility] at [facility] in [place]. You can also call the [facility] Link at [phone].     Please continue to follow-up with your outpatient care providers. Some of your follow-up appointments have been listed below. Please be sure to attend these appointments at the dates and times indicated, and to bring all of your medications with you to help your provider better guide your care.     Your discharge medications are listed here. Please continue to take these medications as directed.               Appointments which have been scheduled for you    Nov 26, 2017 10:30 AM EST  (Arrive by 10:15 AM)  ADULT PERIPHERAL DRAW with [facility] ONC PERIPHERAL LAB DRAW  [facility] CANCER CARE Aaron Edelman HEMATOLOGY ONCOLOGY [place] [facility] [place]) [address]  [place] [address]  [phone]   Nov 29, 2017  9:00 AM EST  (Arrive by 8:45 AM)  RETURN ACTIVE [place] with Velia Meyer, MD  [facility] CANCER CARE [facility] HEMATOLOGY ONCOLOGY [place] [facility] [place]) [address]  [place] [address]  [phone]   Dec 03, 2017 11:30 AM EST  (Arrive by 11:00 AM)  NURSE LAB DRAW with ADULT ONC LAB  [facility] ADULT ONCOLOGY LAB DRAW STATION [facility] [place]) [address]  [place] [address]  [phone]   Dec 03, 2017 12:30 PM EST  (Arrive by 12:00 PM)  BONE MARROW BIOPSY with Vernie Murders, AGNP  [place] ONCOLOGY INFUSION [place] [facility] REGION) [address]  [place] [address]  [phone]   Dec 03, 2017  1:30 PM EST  (Arrive by 1:00 PM)  BLOOD TRANSFUSION - 2 UNITS with [employer] CHAIR [address]  [place] ONCOLOGY INFUSION [facility] REG[facility]) [address]  [place] [address]  [phone]   Dec 07, 2017 10:00 AM EST  (Arrive by 9:45 AM)  RETURN ACTIVE [place] with Velia Meyer, MD  [facility] CANCER CARE [place] HEMATOLOGY ONCOLOGY [place] [facility] [place]) [address]  [place] [address]  [phone]   Dec 09, 2017  3:00 PM EST  (Arrive by 2:30 PM)  LAB ONLY [place] with ADULT ONC LAB  [facility] ADULT ONCOLOGY LAB DRAW STATION [facility]ION) [address]  [place] [address]  [phone]   Dec 09, 2017  4:00 PM EST  (Arrive by 3:30 PM)  RETURN ACTIVE [place] with Avie Arenas, MD  [facility] HEMATOLOGY ONCOLOGY 2ND FLR CANCER HOSP [facility] REGION) [address]  [place] [address]  [phone]   Dec 13, 2017  9:45 AM EST  (Arrive by 9:30 AM)  ADULT PERIPHERAL DRAW with [facility] ONC PERIPHERAL LAB DRAW  [facility] CANCER  CARE [facility] HEMATOLOGY ONCOLOGY [place] [facility] [place]) [address]  [place] [address]  [phone]   Dec 27, 2017  9:30 AM EST  (Arrive by 9:15 AM)  ADULT PERIPHERAL DRAW with [facility] ONC PERIPHERAL LAB DRAW  [facility] CANCER CARE Aaron Edelman HEMATOLOGY ONCOLOGY [place] [facility] [place]) [address]  [place] [address]  [phone]   Jan 10, 2018  9:30 AM EST  (Arrive by 9:15 AM)  ADULT PERIPHERAL DRAW with [facility] ONC PERIPHERAL LAB DRAW  [facility] CANCER CARE Aaron Edelman HEMATOLOGY ONCOLOGY [place] [facility] [place]) [address]  [place] [address]  [phone]   Jan 24, 2018  9:30 AM EDT  (Arrive by 9:15 AM)  ADULT PERIPHERAL DRAW with [facility] ONC PERIPHERAL LAB DRAW  [facility] CANCER CARE Aaron Edelman HEMATOLOGY ONCOLOGY [place] [facility] [place]) [address]  [place] [address]  [phone]

## 2017-11-26 ENCOUNTER — Other Ambulatory Visit: Admit: 2017-11-26 | Discharge: 2017-11-27 | Payer: PRIVATE HEALTH INSURANCE

## 2017-11-26 DIAGNOSIS — R627 Adult failure to thrive: Secondary | ICD-10-CM | POA: Diagnosis not present

## 2017-11-26 DIAGNOSIS — D6189 Other specified aplastic anemias and other bone marrow failure syndromes: Secondary | ICD-10-CM | POA: Diagnosis not present

## 2017-11-26 DIAGNOSIS — C9210 Chronic myeloid leukemia, BCR/ABL-positive, not having achieved remission: Secondary | ICD-10-CM | POA: Diagnosis not present

## 2017-11-26 DIAGNOSIS — C9300 Acute monoblastic/monocytic leukemia, not having achieved remission: Secondary | ICD-10-CM | POA: Diagnosis not present

## 2017-11-26 DIAGNOSIS — I10 Essential (primary) hypertension: Secondary | ICD-10-CM | POA: Diagnosis not present

## 2017-11-26 LAB — CBC W/ DIFFERENTIAL
BANDS - MAN (DIFF): 1
BASOPHILS ABSOLUTE COUNT: 0 10*9/L
BASOPHILS RELATIVE PERCENT: 0 %
EOSINOPHILS ABSOLUTE COUNT: 0 10*9/L
EOSINOPHILS RELATIVE PERCENT: 0 %
HEMATOCRIT: 20.5 %
HEMOGLOBIN: 7 g/dL
LYMPHOCYTES ABSOLUTE COUNT: 0.2 10*9/L
LYMPHOCYTES RELATIVE PERCENT: 44.7 %
LYMPHS: 60
MEAN CORPUSCULAR HEMOGLOBIN CONC: 34.1 g/dL
MEAN CORPUSCULAR HEMOGLOBIN: 29.8 pg
MEAN CORPUSCULAR VOLUME: 87.2 fL
MEAN PLATELET VOLUME: 11.9 fL
METAMYELOCTYES - MAN (DIFF): 1
MONOCYTES - MAN (DIFF): 33
MONOCYTES ABSOLUTE COUNT: 0.2 10*9/L
MONOCYTES RELATIVE PERCENT: 42.1 %
MYELOCYTES: 1
NEUTROPHILS ABSOLUTE COUNT: 0 10*9/L
NEUTROPHILS RELATIVE PERCENT: 10.6 %
PLATELET COUNT: 41 10*9/L
RED BLOOD CELL COUNT: 2.35 10*12/L
RED CELL DISTRIBUTION WIDTH: 12.9 %
WHITE BLOOD CELL COUNT: 0.4 10*9/L

## 2017-11-26 LAB — COMPREHENSIVE METABOLIC PANEL
ALBUMIN: 3.3
ALKALINE PHOSPHATASE: 144 U/L
ANION GAP: 15
BILIRUBIN TOTAL: 1.2 mg/dL
BLOOD UREA NITROGEN: 23 mg/dL
CHLORIDE: 97 mmol/L
CO2: 28 mmol/L
CREATININE: 0.88 mg/dL
EGFR MDRD AF AMER: 60 mL/min/{1.73_m2}
EGFR MDRD NON AF AMER: 60 mL/min/{1.73_m2}
GLUCOSE RANDOM: 124 mg/dL
POTASSIUM: 3.9 mmol/L
PROTEIN TOTAL: 6.2 g/dL
SODIUM: 136 mmol/L

## 2017-11-26 LAB — PHOSPHORUS
Lab: 4.3
PHOSPHORUS: 4.3 mg/dL

## 2017-11-26 LAB — URIC ACID: Lab: 2.5

## 2017-11-26 LAB — MAGNESIUM: Lab: 1.9

## 2017-11-26 LAB — LYMPHOCYTES RELATIVE PERCENT: Lab: 44.7

## 2017-11-26 LAB — ALKALINE PHOSPHATASE: Lab: 144

## 2017-11-26 NOTE — Unmapped (Signed)
Per test claim for Noxafil at the [facility], patient needs Medication Assistance Program for High Copay.

## 2017-11-28 DIAGNOSIS — I10 Essential (primary) hypertension: Secondary | ICD-10-CM | POA: Diagnosis not present

## 2017-11-28 DIAGNOSIS — I2510 Atherosclerotic heart disease of native coronary artery without angina pectoris: Secondary | ICD-10-CM | POA: Diagnosis not present

## 2017-11-28 DIAGNOSIS — M1710 Unilateral primary osteoarthritis, unspecified knee: Secondary | ICD-10-CM | POA: Diagnosis not present

## 2017-11-28 DIAGNOSIS — C9210 Chronic myeloid leukemia, BCR/ABL-positive, not having achieved remission: Secondary | ICD-10-CM | POA: Diagnosis not present

## 2017-11-29 ENCOUNTER — Ambulatory Visit: Admit: 2017-11-29 | Discharge: 2017-11-29 | Payer: PRIVATE HEALTH INSURANCE

## 2017-11-29 ENCOUNTER — Encounter: Admit: 2017-11-29 | Discharge: 2017-11-29 | Payer: PRIVATE HEALTH INSURANCE

## 2017-11-29 ENCOUNTER — Ambulatory Visit
Admit: 2017-11-29 | Discharge: 2017-11-29 | Payer: PRIVATE HEALTH INSURANCE | Attending: Hematology & Oncology | Primary: Hematology & Oncology

## 2017-11-29 ENCOUNTER — Non-Acute Institutional Stay: Admit: 2017-11-29 | Discharge: 2017-11-29 | Payer: PRIVATE HEALTH INSURANCE

## 2017-11-29 DIAGNOSIS — R0602 Shortness of breath: Secondary | ICD-10-CM | POA: Diagnosis not present

## 2017-11-29 DIAGNOSIS — C9300 Acute monoblastic/monocytic leukemia, not having achieved remission: Secondary | ICD-10-CM | POA: Diagnosis not present

## 2017-11-29 DIAGNOSIS — Z87891 Personal history of nicotine dependence: Secondary | ICD-10-CM | POA: Diagnosis not present

## 2017-11-29 DIAGNOSIS — I10 Essential (primary) hypertension: Secondary | ICD-10-CM | POA: Diagnosis not present

## 2017-11-29 DIAGNOSIS — M19041 Primary osteoarthritis, right hand: Secondary | ICD-10-CM | POA: Diagnosis not present

## 2017-11-29 DIAGNOSIS — K219 Gastro-esophageal reflux disease without esophagitis: Secondary | ICD-10-CM | POA: Diagnosis not present

## 2017-11-29 DIAGNOSIS — D6189 Other specified aplastic anemias and other bone marrow failure syndromes: Secondary | ICD-10-CM | POA: Diagnosis not present

## 2017-11-29 DIAGNOSIS — Z955 Presence of coronary angioplasty implant and graft: Secondary | ICD-10-CM | POA: Diagnosis not present

## 2017-11-29 DIAGNOSIS — E785 Hyperlipidemia, unspecified: Secondary | ICD-10-CM | POA: Diagnosis not present

## 2017-11-29 DIAGNOSIS — I252 Old myocardial infarction: Secondary | ICD-10-CM | POA: Diagnosis not present

## 2017-11-29 DIAGNOSIS — Z79899 Other long term (current) drug therapy: Secondary | ICD-10-CM | POA: Diagnosis not present

## 2017-11-29 DIAGNOSIS — Z86010 Personal history of colonic polyps: Secondary | ICD-10-CM | POA: Diagnosis not present

## 2017-11-29 DIAGNOSIS — I2510 Atherosclerotic heart disease of native coronary artery without angina pectoris: Secondary | ICD-10-CM | POA: Diagnosis not present

## 2017-11-29 DIAGNOSIS — Z87442 Personal history of urinary calculi: Secondary | ICD-10-CM | POA: Diagnosis not present

## 2017-11-29 DIAGNOSIS — M19042 Primary osteoarthritis, left hand: Secondary | ICD-10-CM | POA: Diagnosis not present

## 2017-11-29 DIAGNOSIS — D709 Neutropenia, unspecified: Secondary | ICD-10-CM | POA: Diagnosis not present

## 2017-11-29 DIAGNOSIS — C9200 Acute myeloblastic leukemia, not having achieved remission: Secondary | ICD-10-CM | POA: Diagnosis not present

## 2017-11-29 DIAGNOSIS — R112 Nausea with vomiting, unspecified: Secondary | ICD-10-CM | POA: Diagnosis not present

## 2017-11-29 DIAGNOSIS — H9193 Unspecified hearing loss, bilateral: Secondary | ICD-10-CM | POA: Diagnosis not present

## 2017-11-29 DIAGNOSIS — R1110 Vomiting, unspecified: Principal | ICD-10-CM

## 2017-11-29 DIAGNOSIS — E860 Dehydration: Secondary | ICD-10-CM

## 2017-11-29 NOTE — Unmapped (Signed)
Patient arrived to clinic in wheelchair.  Weight and vital signs obtained.  Patient was evaluated by physician at physician encounter.  Orders given for IVF, nausea medication and blood transfusion.  Patient brought to infusion room and # 22g angiocath inserted to right arm with positive blood return.  Patient connected to fluid and given nausea meds as ordered.  Patient given blood as ordered. Patient tolerated well without complaint. Patient d/c to home.  Patient aware of return appointment.

## 2017-11-29 NOTE — Unmapped (Addendum)
November 29, 2017  The results of your laboratory studies were reviewed and discussed in detail.  Because of a very low hemoglobin 6.7 g/dLm 1 unit of packed red blood cells will be given today, and a second unit given tomorrow (January 15).  Both your white blood cell count and platelet count remain low, but stable.      Prior to administration of packed red blood cells, intravenous fluids and anti-nausea medication was given.    You are scheduled now for a follow-up visit at [facility] on Friday.    Barring any unforeseen complications, your next scheduled doctor visit with laboratory studies is on December 07, 2017.    Please do not hesitate to call if any questions or problems arise in the interim.

## 2017-11-29 NOTE — Unmapped (Signed)
[facility], [facility]   Hematology Oncology Progress Note    Arthur Levo.  DOB: December 08, 1945     DATE OF SERVICE  November 29, 2016     REFERRING PROVIDER  Richardean Chimera, MD  [address] [place] [address]     PRIMARY CARE PROVIDER  Richardean Chimera, MD  [address] [place] [address]     CONSULTING PHYSICIAN  Toni Arthurs, M.D.  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      SUMMARY/ASSESSMENT  Pancytopenia; leukopenia/neutropenia with atypical mononuclear cells suggestive of monoblasts, identified on peripheral blood smear from August 27, 2017 associated with extreme fatigue, generalized weakness, malaise, and near nightly damp sweats.    On September 14, 2017 a bone marrow aspiration and biopsy was performed at [facility]. 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 Correlation with cytogenetics and fluorescent in situ hybridization was requested.  I spoke with Dr. Manning Charity, pathology, at [facility].  Both the cytogenetics and molecular studies by fluorescent in situ hybridization were requested.  He has a (46, XY) karyotype.  DNA probe specific molecular analysis failed to reveal any cytogenetic abnormality on chromosome 8 to rule out the presence of trisomy 8.  Unfortunately further molecular profiling was not performed although requested.  Molecular studies for certain abnormalities, such as mutations in FLT3, nucleophosmin (NPM1), KIT, CEBPA, or RUNX1 as well as gene expression profiles confer prognostic significance in adults with AML. AML with mutations of NPM1, RUNX1, biallelic CEBPA, PML-RARA, and others are included as specific entities in the World Health Organization classification.      After a lengthy and detailed discussion, beginning November 7, he was started on cycle 1 of single agent azacitidine once daily for 7 days out of 28.  With the exception of hematologic toxicity in the form of cytopenias, his overall tolerance had been acceptable.    On November 19, he was feeling very poorly over several days, without any clear evidence of infection, it was recommended that he be seen and evaluated by Dr. Donzetta Sprung, his primary care physician; he was admitted for intravenous fluid resuscitation; infectious workup and evaluation; empiric antimicrobial therapy pending the results of pancultures, 2 view chest x-ray, EKG and echocardiogram, troponin levels, and supportive care.  After 2 hospital days, he was discharged following aggressive fluid resuscitation and 1 unit of PRBC.     On October 24, 2017 he was admitted with high fever (102-103) to the service of Dr. Donzetta Sprung, his primary care physician.  In addition to anemia and thrombocytopenia, he felt weak and tired.  Pancultures were obtained and he was started on empiric antibiotic therapy with vancomycin and Zosyn.  Several will sets of blood cultures were negative.  He was transfused 2 units of packed red blood cells.  At the time of his discharge on December 14, he was afebrile.  While hospitalized, his white blood count had risen to 25-30K with blasts in his peripheral blood.  His fever was  felt to be due to his underlying leukemic process.    Previously, cycle 2 of azacitidine was tentatively scheduled to begin on December 17.  That treatment was deferred due to hospitalization and fever workup and empiric antibiotics which failed to reveal any evidence of infection.      Because of absence of physician/oncology coverage in [place], and his deteriorating laboratory and clinical condition, he was admitted to [facility] on November 11, 2017.  Under the direction of Dr. Cristal Deer Dittus, he was started on hydroxyurea, then transitioned to azacitidine and venetoclax beginning November 11, 2017.  Azacitidine was completed on November 18, 2017. Though treated with cefepime from December 31 to January 4, then transition to levofloxacin at the time of discharge, his cultures were persistently negative.  His anorexia, and nutritional status was overall poor.  At the time of discharge, he was markedly weak and immobile; was therefore sent to [facility] for physical therapy.  He continues to the present at [facility].    CT imaging of the chest without intravenous contrast was performed on December 31 at [facility] revealed bibasilar subpleural opacities consistent with fibrosis.  No discrete infiltrate or consolidation was identified.     Over the past several days he has been profoundly weak, tired, immobile, spending at least 90% of his daytime hours in bed.  His physical therapy has been passive exercise only to minimum.  Prior to his presentation today had an episode of nausea and vomiting food material.  He denies any bleeding tendency.  He has had no fever.  There is no unusual cough, sore throat, or orthopnea.  When rising especially he feels dizzy and lightheaded.  The results of his laboratory studies are detailed below.    Coronary artery disease; myocardial infarct in December 2012; status post coronary artery bare-metal stent placement in [facility] July 03, 2011     Gastroesophageal reflux disease     Dyslipidemia     Benign colonic polyps     Former tobacco dependence     Degenerative joint disease especially in his fingers bilaterally     Renal calculi     Bilateral age-related hearing deficit     RECOMMENDATION/PLAN  The results of his laboratory studies were reviewed and discussed in detail.  Those results are outlined below.      He was transfused 1 unit of PRBCs on January 14.  A second unit will be administered on January 15 along with intravenous hydration.  Because of acute episodic nausea and vomiting, he was given intravenous fluid hydration, ondansetron, metoclopramide, and famotidine.  He is no longer nauseous.    We discussed previously the results of a bone marrow aspiration and biopsy, which revealed acute monocytic or myelomonocytic leukemia; conventional cytogenetics were normal.    While leukoneutropenic on chemotherapy, he has been compliant with levofloxacin: 500 mg once daily; acyclovir: 400 mg twice daily; and : posaconazole 100 mg once daily.  He was advised to continue his same regimen, including venetoclax: 100 mg once daily.    He was started previously on ranitidine: 150 mg twice daily in light of his unexplained nausea. He was advised to call us if his nausea returns.  He no longer has difficulty in chewing.  Due to the extrapyramidal side effects of prochlorperazine, ondansetron: 8 mg daily was prescribed as needed.      His next scheduled visit in [place] is on January 18 at which time a bone marrow aspiration  and biopsy will be performed.    Barring any unforeseen complications, his next scheduled doctor visit with laboratory studies is on December 07, 2017 for reevaluation.    He was advised to call us in the interim should any new or untoward problems arise.      BRIEF HISTORY  Arthur Cumbie. is a 72 year old resident of [place] whose past medical history is significant for essential hypertension over the past 20 years; coronary artery disease, previous myocardial infarction in December 2012; placement of a bare metal stent at [facility] in 2012; bilateral age-related hearing deficit; previous small bowel obstruction; gastroesophageal reflux disease; dyslipidemia; benign colonic polyps; former tobacco dependence; renal calculi; and degenerative joint disease especially in his fingers bilaterally. His primary care physician is Dr. Donzetta Sprung. 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 A battery of laboratory studies were performed which revealed a complete blood count with hemoglobin 9.9 hematocrit 30.0 MCV 102 MCH 33.7 RDW 15.2 WBC 1.5 with 17% neutrophils 46% lymphocytes 16% monocytes 4% eosinophils 11% basophils; the platelet count was low but was aggregated.  The uric acid was 3.7.     On October 15, a TSH was 2.96. After presenting with anemia, leukopenia/neutropenia, and thrombocytopenia, his symptoms of easy fatigability were unchanged. His physical examination was relatively unremarkable. A request for bone marrow aspiration and biopsy was requested through [facility]. He was now scheduled on October 30. 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 He is currently not on antihypertensive medication. He denies headache.  He has frequent episodes of nausea without vomiting.     Unfortunately our office was unaware of these problems. On November 19, he was feeling very poorly over several days, without any clear evidence of infection, but high risk nevertheless, it was recommended that he be seen and evaluated by Dr. Donzetta Sprung, his primary care physician; admitted for intravenous fluid resuscitation; infectious workup and evaluation; empiric antimicrobial therapy pending the results of pancultures, 2 view chest x-ray, EKG and echocardiogram, troponin levels, and supportive care.  After 2 hospital days, he was discharged following aggressive fluid resuscitation and 1 unit of PRBC.  In the interim, he is felt better.    On October 24, 2017 he was admitted with high fever (102-103) to the service of Dr. Donzetta Sprung, his primary care physician.  In addition to anemia and thrombocytopenia, he felt weak and tired.  Pancultures were obtained and he was started on empiric antibiotic therapy with vancomycin and Zosyn, while continuing fluconazole and acyclovir by mouth.  Multiple sets of blood cultures were negative.  He was transfused 2 units of packed red blood cells.  At the time of his discharge on December 14, he was afebrile.  While hospitalized, his white blood count had risen to 25-30K with blasts in his peripheral blood.  His fever was felt to be due to his underlying leukemic process. Since his hospital discharge, he has been extremely weak, immobile, and listless.  He spends most of his daytime hours in a recliner. CT imaging, detailed below, suggest a small left-sided pleural effusion and modestly increased paratracheal and mediastinal lymphadenopathy.  No large pleural effusion, consolidation, or pulmonary edema are identified.  His appetite is poor.     Previously, cycle 2 of azacitidine was tentatively scheduled to begin on December 17.  That treatment was deferred due to hospitalization and fever workup and empiric antibiotics which failed to reveal any evidence of bacterial infection.  Because of absence of physician/oncology coverage in [place], and his deteriorating laboratory and clinical condition, he was admitted to [facility] on November 11, 2017.  Under the direction of Dr. Cristal Deer Dittus, he was started on hydroxyurea, then transitioned to azacitidine and venetoclax beginning November 11, 2017.  Azacitidine was completed on November 18, 2017. Though treated with cefepime from December 31 to January 4, then transition to levofloxacin at the time of discharge, his cultures were persistently negative.  His anorexia, and nutritional status was overall poor.      At the time of discharge, he was markedly weak and immobile; was therefore sent to [facility] for physical therapy.  He continues to the present at [facility].  CT imaging of the chest without intravenous contrast was performed on December 31 at [facility] revealed bibasilar subpleural opacities consistent with fibrosis.  No discrete infiltrate or consolidation was identified. The past several days he has been profoundly weak, tired, immobile, spending at least 90% of his daytime hours in bed.  His physical therapy has been passive exercise only to minimum.  Prior to his presentation today had an episode of nausea and vomiting food material.  He denies any bleeding tendency.  He has had no fever.  There is no unusual cough, sore throat, or orthopnea.  When rising especially he feels dizzy and lightheaded.  The results of his laboratory studies are detailed below.    According to his wife he uses Ensure daily and is drinking significant amounts of water daily in spite of a poor appetite.  It is with this background presents now following administration of cycle 2 of azacitidine and venetoclax, started on November 7 with single agent azacitidine; and after having suffered a serious setback with hospital admission for high fever with progressively increasing white blood cell count began cycle 2 of azacitidine and venetoclax beginning November 11, 2017 as described above in the setting of acute monocytic leukemia as outlined above.     INTERVAL HISTORY  In the interim since his last visit, his appetite and weight have decreased.  He spends 90% of his daytime hours in bed.  He is only started to do passive exercises.  Nauseous and dizzy in the early morning.  He has been using supplemental Ensure between meals.  He denies any fever, shaking chills, sweats, or flulike symptoms.  He is profoundly weak.  He has no rash or itching.  Upon rising he has positional dizziness. Over the past 13 months he has lost 40 pounds.  He continues to feel weak and tired.  On presentation he was nauseous and vomited food material.  He had no hematochezia.  He denies any bleeding tendency.  On exertion he is persistently short of breath, stable and consistent with his baseline.  There is no heartburn or indigestion.  He denies diarrhea or constipation.  There is no chest or abdominal pain.  No urinary frequency, urgency, hematuria, or dysuria reported.  He has no epistaxis or hemoptysis. He gets short of breath on exertion, unchanged. He has no bleeding tendency.  He has no syncope, or near syncopal episodes.  He has no pain on swallowing. He experiences dry mouth. He has received the seasonal influenza vaccine. He reports no indigestion. He denies any melena or bright red blood per rectum. He has no swelling of his  ankles. He has chronic stiffness/pain of his fingers with deforming arthralgias. In his family there is no history of blood disorders.  There are no new areas of bone, joint, or muscle pain. He has bilateral numbness of his fingertips on an intermittent basis.  He denies any numbness or tingling in the fingers or toes.        Past Medical History:   Diagnosis Date   ??? Depression     ??? ED (erectile dysfunction)     ??? GERD (gastroesophageal reflux disease)     ??? HTN (hypertension)     ??? Hyperlipidemia     ??? Ischemic heart disease     ??? Osteoarthritis of knee                 Past Surgical History:   Procedure Laterality Date   ??? CARDIAC CATHETERIZATION       ??? COLONOSCOPY   2009   ??? CORONARY ANGIOPLASTY WITH STENT PLACEMENT       ??? KNEE SURGERY       ??? TOTAL KNEE ARTHROPLASTY             EXPOSURE HISTORY  He has no known exposure to toxic chemicals, radiation, or pesticides.          Allergies   Allergen Reactions   ??? Ace Inhibitors Cough   ??? Shellfish Containing Products Nausea And Vomiting   ??? Zithromax [Azithromycin] Rash      Current Outpatient Prescriptions:   ???  atorvastatin (LIPITOR) 40 MG tablet, Take 40 mg by mouth daily., Disp: , Rfl:   Levofloxacin 500 mg once daily  Fluconazole 100 mg once daily  Acyclovir: 400 mg twice daily  ???  pantoprazole (PROTONIX) 40 MG tablet, Take 40 mg by mouth daily., Disp: ,            Family History   Problem Relation Age of Onset   ??? Heart disease Mother     ??? Heart disease Father        Mother: Deceased: Age 76 years: MVA  Father: Deceased: Age 62 years: MI  He has no brothers  Sister (1): Age 78 years: Nonmelanoma skin cancer     Social History            Social History   ??? Marital status: N/A       Spouse name: N/A   ??? Number of children: N/A   ??? Years of education: N/A           Social History Main Topics   ??? Smoking status: Former Smoker   ??? Smokeless tobacco: Never Used   ??? Alcohol use No   ??? Drug use: No   ??? Sexual activity: Not on file           Other Topics Concern   ??? Not on file          Social History Narrative   ??? 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 Both appetite and 40 pound weight deficit for the past 13 months, now stable; energy level diminished.  Skin: No rash, scaling, sores, lumps, or jaundice.    HEENT: No visual changes; bilateral age-related hearing deficit.   Pulmonary: No sore throat; he has a dry cough without productive phlegm; no dyspnea or orthopnea; increasing DOE.  Cardiovascular: He has coronary artery disease; status post coronary artery bare-metal stent placement and myocardial infarction in 2012; no angina. No cardiac dysrhythmia; essential hypertension for the past 20 years; no dyslipidemia.   Gastrointestinal: No indigestion, dysphagia, abdominal pain, or constipation. No change in bowel habits; no dyspepsia; no diarrhea.  Genitourinary: No urinary frequency, urgency, hematuria, or dysuria.   Musculoskeletal: Deforming arthralgias of the fingers; no myalgias; no joint swelling, pain or instability.   Hematologic: No bleeding tendency or easy bruisability.   Endocrine: No intolerance to heat or cold; hypothyroid disease; no diabetes mellitus.   Vascular: He has a abdominal aortic aneurysm. No peripheral arterial or venous thromboembolic disease.   Psychological: No anxiety, depression, or mood changes; no mental health illnesses.   Neurological: No dizziness, lightheadedness, syncope, or near syncopal episodes; he has bilateral numbness of his fingertips on an intermittent basis the past month.  No numbness or tingling in feet/toes.      PHYSICAL EXAMINATION  Vital Signs: BP 105/54    HR 78    RR 18    T 97.7    WT 145  Constitutional: Arthur Bates is thin and frail. He looks age appropriate.  He is friendly and cooperative without respiratory compromise at rest.   Skin: No rashes, scaling, dryness, jaundice, or itching.   HEENT: Head is normocephalic and atraumatic. Pupils are equal round and reactive to light and accommodation. Sclerae are anicteric. Conjunctivae are pale. No sinus tenderness nor oropharyngeal lesions. Mucous membranes are moist.   Neck: Supple and symmetric. No jugular venous distention or thyromegaly. Trachea is midline.   Lymphatics: No cervical or supraclavicular lymphadenopathy. No epitrochlear, axillary, or inguinal lymphadenopathy is appreciated.   Respiratory/chest: Thorax is symmetrical. Breath sounds are clear to auscultation and percussion. Normal excursion and respiratory effort.   Back: Symmetric without deformity or tenderness.   Cardiovascular: Heart rate and rhythm are regular without murmurs.  Gastrointestinal: Abdomen is flat, soft, nontender; no organomegaly. Bowel sounds are normoactive. No masses are appreciated.   Extremities: In the lower extremities, there is no asymmetric swelling, erythema, tenderness, or cord formation. No clubbing, cyanosis, nor edema.   Hematologic: No petechiae, hematomas, or ecchymoses.   Psychological: He is oriented to person, place, and time; normal affect.  Neurological: There are no gross neurologic deficits.      LABORATORY STUDIES  On November 29, 2017: A complete blood count shows hemoglobin 6.7 hematocrit 20% WBC 0.6; platelets 50,000.  A comprehensive metabolic panel shows sodium 137 potassium 3.8 chloride 97 CO2 27 glucose 110 BUN 18 creatinine 0.93 calcium 9.0 albumin 3.5 alkaline phosphatase 146 total protein 6.4 SGOT 34 SGPT 18 total bilirubin 1.3 magnesium 1.9 phosphorus 3.7.    IMAGING STUDIES  MRI Brain W Wo Contrast on November 20, 2017  COMPARISON: None    There is no abnormal intracranial enhancement.    There is diffuse T1 hypointensity throughout the bone marrow with areas of restricted diffusion most notably involving the inferior occipital bone where there is intense enhancement and nonsuppression on the fat-saturated T2 sequence.       Impression     -  No acute intracranial abnormality.     -Bone marrow changes in keeping with acute myeloid leukemia.               DATE: 11/15/2017 4:35 PM    COMPARISON: Chest radiographs, 11/11/2017    AIRWAYS, LUNGS, PLEURA:   Clear central airways. Diffuse bronchial wall thickening.    Biapical pleural-parenchymal scarring. Subpleural scarring in the bases of the lingula with focus of traction bronchiectasis, as well as within the lower lobes, measuring up to 9 mm in thickness in the right lower lobe base (4:79). No lung consolidation or discrete pulmonary nodules.     Trace pleural effusions (left greater than right). No pneumothoraces.    MEDIASTINUM:   Aortic, branch vessel, aortic valve, and coronary artery calcifications. Dilated ascending aorta, 4.0 cm (4:47).   Heart mildly prominent in size (left ventricular predominance). No pericardial effusion.     No lymphadenopathy by size criteria.    IMAGED ABDOMEN: Partially imaged 7 mm left upper pole nonobstructing renal stone (4:96).    SOFT TISSUES: Unremarkable.    BONES: No suspicious osseous lesions.      Impression     -Bibasilar subpleural opacities, likely fibrosis. No consolidations or discrete nodules to suggest infection.    -Trace left greater than right pleural effusions        CT imaging of the chest with out intravenous contrast was performed on November 02, 2017.  The results of that study revealed the heart size within normal limits.  There was trace pericardial thickening/fluid.  Significant calcifications were seen in the left main, left anterior descending, circumflex, and right coronary arteries.  He had also aortic valve calcifications.  There were multiple mediastinal lymph nodes again demonstrated.  There appears to be interval enlargement compared with the study in October.  The peritracheal lymph node index lesion measures 1.2 cm.  It is increased from 0.9 cm.  A right paratracheal node measures 0.8 cm, compared to 0.6 cm previously.  The lower paratracheal lymph node measures 1.6 cm, compared with 1.2 cm.  There is mild centrilobular emphysema along with mild bronchial wall thickening not significantly changed from the prior study.  There was no confluent airspace disease.  A chronic nodular opacity in the lingula was unchanged.  Trace left-sided pleural effusion was identified.  There was no aggressive lucent or sclerotic lesions.  Degenerative changes are seen in the thoracic spine.  There is no evidence of pulmonary edema or lobar pneumonia.    On September 15, 2017 a CT angiogram with intravenous contrast was performed at [facility].  Respiratory motion blurs several of the images.  Contrast opacification of the pulmonary arteries was acceptable.  There were no filling defects within either the main pulmonary artery or their segmental branches in either lung.  The heart was mildly enlarged.  Aortic annular calcification was identified.  There was mild aortic valvular calcification.  Three-vessel coronary atherosclerosis was identified.  There was no pericardial effusion.  The bilateral mediastinal and hilar lymph nodes were normal in size.  There was a normal-appearing esophagus.  There was mild biapical pleural-parenchymal scarring.  Focal scar/bronchiectasis involving the lingula was seen.  There was no confluent airspace consolidation.  No pulmonary parenchymal nodules or masses were identified.  There were no pleural effusions.  There was an incompletely imaged calculi within the calyx of the left kidney.  The visualized upper abdomen was otherwise unremarkable.    On September 07, 2017 a 2 view chest x-ray was  performed at [facility]. It was compared with the previous chest x-ray from June 27, 2015. The results of that study revealed the heart size and mediastinal contours were within normal limits. There was mild chronic bronchovascular and interstitial prominence that appear stable. There is no evidence of pulmonary edema, consolidation, pneumothorax, nodule, or pleural fluid. The visualized skeleton structures are unremarkable.    This note was dictated using voice activated technology/software. Unfortunately typographical areas are not uncommon, transcription is subject to mistakes, and possibly misinterpretation. Any clarification of the above information can be discussed with me at any time.    The total time spent reviewing his interval history, hospital appearance, laboratory 30s,, physical examination, and discussion was 40 minutes. At least 50% of that time was spent counseling and answering questions.     FOLLOW UP: AS DIRECTED      cc:     Donzetta Sprung M.D.             Christopher Dittus DO

## 2017-11-30 ENCOUNTER — Encounter: Admit: 2017-11-30 | Discharge: 2017-12-01 | Payer: PRIVATE HEALTH INSURANCE

## 2017-11-30 DIAGNOSIS — D6189 Other specified aplastic anemias and other bone marrow failure syndromes: Secondary | ICD-10-CM | POA: Diagnosis not present

## 2017-11-30 LAB — CBC W/ DIFFERENTIAL
BANDS - MAN (DIFF): 2
BASOPHILS ABSOLUTE COUNT: 0 10*9/L
BASOPHILS RELATIVE PERCENT: 0 %
EOSINOPHILS - MAN (DIFF): 1
EOSINOPHILS ABSOLUTE COUNT: 0 10*9/L
EOSINOPHILS RELATIVE PERCENT: 1.6 %
HEMOGLOBIN: 6.7 g/dL
LYMPHOCYTES ABSOLUTE COUNT: 0.5 10*9/L
MEAN CORPUSCULAR HEMOGLOBIN CONC: 33.5 g/dL
MEAN CORPUSCULAR HEMOGLOBIN: 29.4 pg
MEAN CORPUSCULAR VOLUME: 87.7 fL
MEAN PLATELET VOLUME: 12.2 fL
MONOCYTES ABSOLUTE COUNT: 0.1 10*9/L
MONOCYTES RELATIVE PERCENT: 20.3 %
MONOCYTES: 4
NEUTROPHILS ABSOLUTE COUNT: 0 10*9/L
NEUTROPHILS RELATIVE PERCENT: 6.2 %
NUCLEATED RED BLOOD CELLS: 5 /100{WBCs}
PLATELET COUNT: 50 10*9/L
RED BLOOD CELL COUNT: 2.28 10*12/L
RED CELL DISTRIBUTION WIDTH: 13 %
SEGS MAN: 6
WBC ADJUSTED: 0.6 10*9/L

## 2017-11-30 LAB — COMPREHENSIVE METABOLIC PANEL
ALBUMIN: 3.5
ALT (SGPT): 18 U/L
ANION GAP: 17
AST (SGOT): 24 U/L
BILIRUBIN TOTAL: 1.3 mg/dL
BLOOD UREA NITROGEN: 18 mg/dL
CALCIUM: 9.2 mg/dL
CHLORIDE: 97 mmol/L
CO2: 26.5 mmol/L
CREATININE: 0.93 mg/dL
EGFR MDRD AF AMER: 60 mL/min/{1.73_m2}
GLUCOSE RANDOM: 119 mg/dL
POTASSIUM: 3.8 mmol/L
PROTEIN TOTAL: 6.4 g/dL
SODIUM: 137 mmol/L

## 2017-11-30 LAB — ALKALINE PHOSPHATASE: Lab: 146

## 2017-11-30 LAB — MAGNESIUM: Lab: 1.9

## 2017-11-30 LAB — PHOSPHORUS: Lab: 3.7

## 2017-11-30 LAB — HYPOCHROMIA

## 2017-11-30 NOTE — Unmapped (Signed)
Patient arrived to clinic ambulatory.  Weight and vital signs obtained. #22g angiocath inserted to right arm. Patient connected to blood as ordered.  Patient tolerated transfusion well without complaint.  Patient d/c to home.  Patient aware of return appointment.

## 2017-12-01 DIAGNOSIS — E119 Type 2 diabetes mellitus without complications: Secondary | ICD-10-CM | POA: Diagnosis not present

## 2017-12-01 DIAGNOSIS — E7849 Other hyperlipidemia: Secondary | ICD-10-CM | POA: Diagnosis not present

## 2017-12-01 DIAGNOSIS — D518 Other vitamin B12 deficiency anemias: Secondary | ICD-10-CM | POA: Diagnosis not present

## 2017-12-01 DIAGNOSIS — Z79899 Other long term (current) drug therapy: Secondary | ICD-10-CM | POA: Diagnosis not present

## 2017-12-01 LAB — HLA CL I&II, HIGH RES

## 2017-12-01 LAB — SBT HLA A #2

## 2017-12-01 NOTE — Unmapped (Signed)
Lay navigator completed the following activity

## 2017-12-01 NOTE — Unmapped (Signed)
-----   Message from Dewitt Hoes sent at 12/01/2017  1:53 PM EST -----      ----- Message -----  From: Sinclair Grooms  Sent: 11/30/2017  11:31 AM  To: Marlana Salvage to Mr. Celene Skeen wife on 11/30/17 at 11:30 AM.  They received the pharmacy assistance form and she plans to fill it out and fax it back today.  Sinclair Grooms  ----- Message -----  From: Dewitt Hoes  Sent: 11/30/2017  10:55 AM  To: Sinclair Grooms, Gilberto Better, CPP    Hi Pattricia Boss,    Can you please call [name] and ask if he received the Pharmacy Assistance form yesterday? If so, please ask if he needs help filling it out and see if he can fax it to [place] today. If not, please see if you can fax the form to him. Thanks! You are an all-star!      - Nicoletta Ba

## 2017-12-03 ENCOUNTER — Ambulatory Visit
Admit: 2017-12-03 | Discharge: 2017-12-04 | Payer: PRIVATE HEALTH INSURANCE | Attending: Adult Health | Primary: Adult Health

## 2017-12-03 ENCOUNTER — Encounter: Admit: 2017-12-03 | Discharge: 2017-12-04 | Payer: PRIVATE HEALTH INSURANCE

## 2017-12-03 DIAGNOSIS — C9300 Acute monoblastic/monocytic leukemia, not having achieved remission: Secondary | ICD-10-CM | POA: Diagnosis not present

## 2017-12-03 DIAGNOSIS — D6189 Other specified aplastic anemias and other bone marrow failure syndromes: Secondary | ICD-10-CM | POA: Diagnosis not present

## 2017-12-03 LAB — MANUAL DIFFERENTIAL
BASOPHILS - ABS (DIFF): 0 10*9/L (ref 0.0–0.1)
LYMPHOCYTES - ABS (DIFF): 0.4 10*9/L — ABNORMAL LOW (ref 1.5–5.0)
MONOCYTES - ABS (DIFF): 0.4 10*9/L (ref 0.2–0.8)

## 2017-12-03 LAB — CBC W/ AUTO DIFF
HEMATOCRIT: 24.3 % — ABNORMAL LOW (ref 41.0–53.0)
HEMOGLOBIN: 8.5 g/dL — ABNORMAL LOW (ref 13.5–17.5)
MEAN CORPUSCULAR HEMOGLOBIN CONC: 35 g/dL (ref 31.0–37.0)
MEAN CORPUSCULAR HEMOGLOBIN: 30.4 pg (ref 26.0–34.0)
MEAN PLATELET VOLUME: 11.8 fL — ABNORMAL HIGH (ref 7.0–10.0)
NUCLEATED RED BLOOD CELLS: 5 /100{WBCs} — ABNORMAL HIGH (ref <=4)
PLATELET COUNT: 24 10*9/L — ABNORMAL LOW (ref 150–440)
RED BLOOD CELL COUNT: 2.79 10*12/L — ABNORMAL LOW (ref 4.50–5.90)
RED CELL DISTRIBUTION WIDTH: 14.1 % (ref 12.0–15.0)
WBC ADJUSTED: 0.8 10*9/L — ABNORMAL LOW (ref 4.5–11.0)

## 2017-12-03 LAB — PLATELET COUNT: Lab: 50 — ABNORMAL LOW

## 2017-12-03 LAB — WBC ADJUSTED: Lab: 0.8 — ABNORMAL LOW

## 2017-12-03 LAB — SMEAR REVIEW

## 2017-12-03 NOTE — Unmapped (Signed)
No answer x 2 when called. Will call after procedure.    1300 Consent has been witnessed between provider and patient. Time out performed at bedside. Pt aware of procedure. Identification confirmed from arm band, DOB, MRN,  and CSN.    Tolerated procedure w/o difficulty.

## 2017-12-03 NOTE — Unmapped (Signed)
Encounter addended by: Georgana Curio on: 12/03/2017  2:58 PM<BR>    Actions taken: Test resulted

## 2017-12-03 NOTE — Unmapped (Signed)
Please leave dressing in place and keep it dry for 24 hrs before removing. You can resume normal activities tomorrow, but take things easy today.     You may take tylenol as needed for discomfort at the area where the biopsy was taken.     After receiving IV Ativan, please do not drive today.     If you have questions please call [phone] and speak to the nurse navigator after hours:   For emergencies, evenings or weekends, please call [phone] and ask for oncology fellow on call.     Reasons to call emergency line may include:   Fever of 100.5 or greater   Nausea and/or vomiting not relieved with nausea medicine   Diarrhea or constipation   Severe pain not relieved with usual pain regimen     Lab on 12/03/2017   Component Date Value Ref Range Status   ??? WBC 12/03/2017   4.5 - 11.0 10*9/L Preliminary    Pending.   ??? RBC 12/03/2017 2.79* 4.50 - 5.90 10*12/L Preliminary   ??? HGB 12/03/2017 8.5* 13.5 - 17.5 g/dL Preliminary   ??? HCT 12/03/2017 24.3* 41.0 - 53.0 % Preliminary   ??? MCV 12/03/2017 86.8  80.0 - 100.0 fL Preliminary   ??? MCH 12/03/2017 30.4  26.0 - 34.0 pg Preliminary   ??? MCHC 12/03/2017 35.0  31.0 - 37.0 g/dL Preliminary   ??? RDW 12/03/2017 14.1  12.0 - 15.0 % Preliminary   ??? MPV 12/03/2017 11.8* 7.0 - 10.0 fL Preliminary   ??? Platelet 12/03/2017 24* 150 - 440 10*9/L Preliminary    Questionable result confirmed. Specimen integrity/identity confirmed.   ??? Antibody Screen 12/03/2017 NEG   Final

## 2017-12-03 NOTE — Unmapped (Signed)
Pt to remain in room after BMBX. Per Dr Vertell Limber is to get platelets after BMBX.     1321 Called blood bank to make them aware of Dr Bronson Curb request.      1645 Tolerated transfusion w/o difficulty. PIV remoevd after post platelet drawn. Pt assisted to wheelchair, left infusion with caregiver escorting

## 2017-12-03 NOTE — Unmapped (Signed)
Date of Service: 12/03/2017      Patient Active Problem List   Diagnosis   ??? Serrated adenoma of colon   ??? Abdominal aortic aneurysm (CMS-HCC)   ??? Coronary atherosclerosis of native coronary artery   ??? Hyperlipidemia   ??? Hypertension   ??? OA (osteoarthritis) of knee   ??? Acute myeloid leukemia (AML), M5 (CMS-HCC)   ??? Dehydration   ??? Encounter for antineoplastic chemotherapy   ??? Hypokalemia   ??? 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 SBO (small bowel obstruction) (CMS-HCC)   ??? Failure to thrive in adult   ??? Vomiting       Indication:   1. Acute myeloid leukemia (AML), M5 (CMS-HCC)  Cytogenetics Cancer/Fish Order NON-BLOOD    Cytogenetics Cancer/Fish Order NON-BLOOD    Hematopathology Order    Hematopathology Order    Referral Laboratory Test, Other    Referral Laboratory Test, Other    NPM1, Quantitative Type A    NPM1, Quantitative Type A       Premedication: None    Ordering Provider: Tessa Lerner, MD    Clinician(s) Performing Procedure:  Markus Jarvis, AGNP        Bone Marrow Aspirate and Biopsy, right side    The procedure risks and alternatives of the procedure were explained to the patient.  The patient verbalized understanding and signed informed consent. After a time-out in which his patient identifiers were checked by 2 providers, the patient was laid in prone position on the table.   The  posterior superior iliac spine and iliac crest were cleaned, prepped and draped in the usual sterile fashion.     Anesthetic agent used: 2% plain lidocaine.      Utilizing a Ranfac needle, a bone marrow aspiration and biopsy was performed.  Specimen was sent for routine histopathologic stains and sectioning, flow cytometry, cytogenetics and molecular analysis.     A pressure dressing was applied to the biopsy site.  Patient tolerated the procedure well.  Hemostasis was confirmed upon discharge.     The patient was given verbal instructions for wound care, such as to keep the biopsy site dry, covered for 24 hours, and to call your physician for a temperature > 100.5.  Tylenol may be taken for discomfort.    Specimens Collected:  EDTA x 2  Heparin x 1  Core biopsy x 1

## 2017-12-03 NOTE — Unmapped (Signed)
Encounter addended by: Jamison Neighbor, RN on: 12/03/2017  3:45 PM<BR>    Actions taken: Flowsheet accepted

## 2017-12-04 NOTE — Unmapped (Signed)
If you feel like you're having an emergency please call 911.  For appointments or questions Monday through Friday 8AM-5PM please call [phone] or Toll Free [phone]. For Medical questions or concerns ask for the Nurse Triage Line.  On Nights, Weekends, and Holidays call [phone] and ask for the Oncologist on Call.  Reasons to call the Nurse Triage Line:  Fever of 100.5 or greater  Nausea and/or vomiting not relived with nausea medicine  Diarrhea or constipation  Severe pain not relieved with usual pain regimen  Shortness of breath  Uncontrolled bleeding  Mental status changes    If you feel like you're having an emergency please call 911.  For appointments or questions Monday through Friday 8AM-5PM please call [phone] or Toll Free [phone]. For Medical questions or concerns ask for the Nurse Triage Line.  On Nights, Weekends, and Holidays call [phone] and ask for the Oncologist on Call.  Reasons to call the Nurse Triage Line:  Fever of 100.5 or greater  Nausea and/or vomiting not relived with nausea medicine  Diarrhea or constipation  Severe pain not relieved with usual pain regimen  Shortness of breath  Uncontrolled bleeding  Mental status changes

## 2017-12-07 ENCOUNTER — Encounter: Admit: 2017-12-07 | Discharge: 2017-12-07 | Payer: PRIVATE HEALTH INSURANCE

## 2017-12-07 DIAGNOSIS — R531 Weakness: Secondary | ICD-10-CM | POA: Diagnosis not present

## 2017-12-07 DIAGNOSIS — Z79899 Other long term (current) drug therapy: Secondary | ICD-10-CM | POA: Diagnosis not present

## 2017-12-07 DIAGNOSIS — C9300 Acute monoblastic/monocytic leukemia, not having achieved remission: Secondary | ICD-10-CM | POA: Diagnosis not present

## 2017-12-07 DIAGNOSIS — E785 Hyperlipidemia, unspecified: Secondary | ICD-10-CM | POA: Diagnosis not present

## 2017-12-07 DIAGNOSIS — R5383 Other fatigue: Secondary | ICD-10-CM | POA: Diagnosis not present

## 2017-12-07 DIAGNOSIS — I10 Essential (primary) hypertension: Secondary | ICD-10-CM | POA: Diagnosis not present

## 2017-12-07 DIAGNOSIS — I2510 Atherosclerotic heart disease of native coronary artery without angina pectoris: Secondary | ICD-10-CM | POA: Diagnosis not present

## 2017-12-07 DIAGNOSIS — Z9889 Other specified postprocedural states: Secondary | ICD-10-CM | POA: Diagnosis not present

## 2017-12-07 DIAGNOSIS — C9200 Acute myeloblastic leukemia, not having achieved remission: Secondary | ICD-10-CM | POA: Diagnosis not present

## 2017-12-07 DIAGNOSIS — D61818 Other pancytopenia: Secondary | ICD-10-CM | POA: Diagnosis not present

## 2017-12-07 LAB — CBC W/ DIFFERENTIAL
BASOPHILS ABSOLUTE COUNT: 0 10*9/L
EOSINOPHILS ABSOLUTE COUNT: 0 10*9/L
HEMATOCRIT: 20.9 %
HEMOGLOBIN: 6.8 g/dL
LYMPHOCYTES ABSOLUTE COUNT: 0.3 10*9/L
LYMPHOCYTES RELATIVE PERCENT: 55.9 %
LYMPHS: 70
MEAN CORPUSCULAR HEMOGLOBIN CONC: 32.5 g/dL
MEAN CORPUSCULAR HEMOGLOBIN: 29.1 pg
MEAN CORPUSCULAR VOLUME: 89.3 fL
MEAN PLATELET VOLUME: 12.6 fL
MONOCYTES - MAN (DIFF): 26
MONOCYTES ABSOLUTE COUNT: 0.2 10*9/L
MONOCYTES RELATIVE PERCENT: 28.8 %
NEUTROPHILS ABSOLUTE COUNT: 0.1 10*9/L
NEUTROPHILS RELATIVE PERCENT: 6.8 %
NUCLEATED RED BLOOD CELLS: 5 /100{WBCs}
PLATELET COUNT: 34 10*9/L
RED BLOOD CELL COUNT: 2.34 10*12/L
RED CELL DISTRIBUTION WIDTH: 13.5 %
SEGS MAN: 4
WHITE BLOOD CELL COUNT: 0.6 10*9/L

## 2017-12-07 LAB — COMPREHENSIVE METABOLIC PANEL
ALBUMIN: 3.2
ALKALINE PHOSPHATASE: 101 U/L
ALT (SGPT): 15 U/L
ANION GAP: 14
AST (SGOT): 18 U/L
BILIRUBIN TOTAL: 1.1 mg/dL
BLOOD UREA NITROGEN: 22 mg/dL
CALCIUM: 9.3 mg/dL
CO2: 22.2 mmol/L
CREATININE: 0.82 mg/dL
EGFR MDRD AF AMER: 60 mL/min/{1.73_m2}
EGFR MDRD NON AF AMER: 60 mL/min/{1.73_m2}
GLUCOSE RANDOM: 101 mg/dL
POTASSIUM: 4.3 mmol/L
SODIUM: 137 mmol/L

## 2017-12-07 LAB — SENDOUT TEST RESULT

## 2017-12-07 LAB — CALCIUM: Lab: 9.3

## 2017-12-07 LAB — MEAN CORPUSCULAR VOLUME: Lab: 89.3

## 2017-12-07 MED ORDER — AMOXICILLIN 875 MG-POTASSIUM CLAVULANATE 125 MG TABLET
ORAL_TABLET | Freq: Two times a day (BID) | ORAL | 0 refills | 0.00000 days | Status: CP
Start: 2017-12-07 — End: 2017-12-21

## 2017-12-07 NOTE — Unmapped (Signed)
??  Arthur Bates, Arthur Bates, Arthur Bates[facility] The   Hematology Oncology Return Visit   DATE OF SERVICE  12/07/2017     REFERRING PROVIDER   Richardean Chimera, Md  [address] [place], [place] [address]    PRIMARY CARE PROVIDER  Richardean Chimera, MD  [address] 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    Arthur HISTORY  Oncology History    Diagnosis: AML    Presentation: Pancytopenia    Bone marrow biopsy: 09/14/2017  Hypercellular (>95%) marrow with approximately 40% blasts  Dysmegakaryopoiesis  Aspirate- predominance of blasts- no manual aspirate differential given    Flow- 39% blasts- CD34+ (subset), CD13, CD15, CD7, CD11c ,CD33, CD117, HLA-DR    Cytogenetics: Normal    Molecular Mutations: Not done    Treatment:  Azacitidine 75 mg/m2 SQ days 1-7  Cycle 1: 11/07        Acute myeloid leukemia (AML), M5 (CMS-HCC)    09/17/2017 Initial Diagnosis     Acute myeloid leukemia (AML), M5 (CMS-HCC)          Arthur STAGING  Arthur Staging  No matching staging information was found for the patient.    CURRENT HISTORY    Patient underwent bone marrow bx and aspirate performed at [facility] on 12/03/2017.  He remains of Venetoclax 100 mg po daily.  He is still living at [facility].  Patient feels weak, swimmy headed.  Was seen at [facility] and was placed on Amoxicillin for ear infection   Spends most time in bed and is managing to participate some in rehab.     ASSESSMENT    72 year old male with history of coronary artery disease, MI, GERD, colon polyps, tobacco dependence and overall poor performance status who has AML with normal karyotype that has been incompletely characterized.  Patient has been treated with a cycle of cytarabine/ venetoclax and is now being treated with venetoclax    RECOMMENDATION/PLAN    AML with poor performance status: Patient is currently receiving venetoclax for palliation.  Awaiting results of bone marrow biopsy and aspirate performed on December 03, 2017 in the hopes that this may direct future therapy particularly since it is very likely the complete molecular characterization will be carried out.    Possible otitis:  Change Amoxicillin to Augmentin 875 mg po bid x 14 days.      Transfusion support:  Received PLT's on January 18th 2019.  Would recommend that we transfuse packed red blood cells for hemoglobin less than 8 and platelets if count is less than 10 and/or actively bleeding.    Restaging bone marrow bx:  Performed January 18th 2019 at [facility].  Will return there for follow up on January 24th,  receive results    Goals of care: I explained to the patient and his wife that he is receiving palliative therapy.  AML is curable under certain circumstances but that requires the patient is fit enough to undergo induction chemotherapy and in many cases bone marrow transplant.  He clearly lacks performance status which would allow treatment with curative intent.  I recommended that the patient and his wife consider this and making their healthcare decisions.  Poor performance status: Likely multifactorial but the main culprit is likely his underlying malignancy.       HISTORY OF PRESENT ILLNESS   Arthur Bates is a 72 y.o. 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  He had associated with this extreme fatigue, generalized weakness, malaise, and near nightly damp sweats.  ??  On September 14, 2017 a bone marrow aspiration and biopsy was performed at [facility]. 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 Correlation with cytogenetics and fluorescent in situ hybridization was requested.  Evaluation by [facility] revealed  a (46, XY) karyotype.  DNA probe specific molecular analysis failed to reveal any cytogenetic abnormality on chromosome 8 to rule out the presence of trisomy 8.  Unfortunately further molecular profiling was not performed although requested by Dr. Donnie Coffin.  Molecular studies for certain abnormalities, such as mutations in FLT3, nucleophosmin (NPM1), KIT, CEBPA, or RUNX1 as well as gene expression profiles confer prognostic significance in adults with AML. AML with mutations of NPM1, RUNX1, biallelic CEBPA, PML-RARA, and others are included as specific entities in the World Health Organization classification.    ??  On September 22, 2017 he was started on cycle 1 of single agent azacitidine once daily for 7 days out of 28.  With the exception of hematologic toxicity in the form of cytopenias, overall tolerance had been acceptable.  ??  On October 04, 2017 Patient was admitted to [facility] for evaluation and management of extreme fatigue.  While hospitalized he received IVF, infectious workup and evaluation; empiric antimicrobial therapy pending the results of pancultures, 2 view chest x-ray, EKG and echocardiogram, troponin levels, and supportive care.  After 2 hospital days, he was discharged home.     ??On October 24, 2017 he was admitted with high fever (102-103) to the service of Dr. Donzetta Sprung, his primary care physician.  In addition to anemia and thrombocytopenia, he felt weak and tired.  Pancultures were obtained and he was started on empiric antibiotic therapy with vancomycin and Zosyn.  Several will sets of blood cultures were negative.  He was transfused 2 units of packed red blood cells.  At the time of his discharge on December 14, he was afebrile.  While hospitalized, his white blood count had risen to 25-30K with blasts in his peripheral blood.  His fever was felt to be due to his underlying leukemic process.  ??  Cycle 2 of azacitidine was scheduled to begin on November 01 2017.  That treatment was deferred due to hospitalization and fever workup and empiric antibiotics which failed to reveal any evidence of infection.    ??  Because of his deteriorating laboratory and clinical condition, he was admitted to [facility] on November 11, 2017.  Under the direction of Dr. Cristal Deer Dittus, he was started on hydroxyurea, then transitioned to azacitidine and venetoclax beginning November 11, 2017.  Azacitidine was completed on November 18, 2017. Though treated with cefepime from December 31 to January 4, then transition to levofloxacin at the time of discharge, his cultures were persistently negative.CT imaging  of the chest without intravenous contrast was performed on December 31 at [facility] revealed bibasilar subpleural opacities consistent with fibrosis.  No discrete infiltrate or consolidation was identified. His anorexia, and nutritional status and functional status were poor necessitating admission to [facility].  .    ??  ??  Over the past several days he has been profoundly weak, tired, immobile, spending at least 90% of his daytime hours in bed.  His physical therapy has been passive exercise only to minimum.    PAST MEDICAL HISTORY  Past Medical History:   Diagnosis Date   ??? Coronary artery disease    ??? Depression    ??? Dissecting aortic aneurysm (any part), abdominal (CMS-HCC)    ??? ED (erectile dysfunction)    ??? GERD (gastroesophageal reflux disease)    ??? HTN (hypertension)    ??? Hyperlipidemia    ??? Ischemic heart disease    ??? Osteoarthritis of knee SURGICAL HISTORY  Past Surgical History:   Procedure Laterality Date   ??? CARDIAC CATHETERIZATION     ??? COLONOSCOPY  2009   ??? CORONARY ANGIOPLASTY WITH STENT PLACEMENT     ??? KNEE SURGERY     ??? TOTAL KNEE ARTHROPLASTY         ALLERGIES:  Allergies   Allergen Reactions   ??? Ace Inhibitors Cough   ??? Shellfish Containing Products Nausea And Vomiting   ??? Zithromax [Azithromycin] Rash       MEDICATIONS:    Current Outpatient Prescriptions:   ???  acyclovir (ZOVIRAX) 400 MG tablet, Take 400 mg by mouth Two (2) times a day., Disp: , Rfl:   ???  allopurinol (ZYLOPRIM) 300 MG tablet, Take 1 tablet (300 mg total) by mouth daily., Disp: 30 tablet, Rfl: 0  ???  famotidine (PEPCID) 20 MG tablet, Take 1 tablet (20 mg total) by mouth Two (2) times a day., Disp: 60 tablet, Rfl: 0  ???  levoFLOXacin (LEVAQUIN) 500 MG tablet, Take 500 mg by mouth daily., Disp: , Rfl:   ???  mirtazapine (REMERON) 15 MG tablet, Take 1 tablet (15 mg total) by mouth nightly., Disp: 90 tablet, Rfl: 3  ???  polyethylene glycol (MIRALAX) 17 gram packet, MIX 1 PACKET OF POWDER (17 GM) IN 8 OZ OF JUICE OR WATER AND DRINK BY MOUTH DAILY, Disp: , Rfl: 6  ???  posaconazole (NOXAFIL) 100 mg TbEC delayed released tablet, Take 300 mg by mouth daily., Disp: 90 tablet, Rfl: 2  ???  senna (SENOKOT) 8.6 mg tablet, Take 2 tablets by mouth Two (2) times a day. Hold for diarrhea., Disp: 120 tablet, Rfl: 0  ???  venetoclax (VENCLEXTA) 100 mg tablet, Take 1 tablet (100 mg total) by mouth daily with evening meal. Take with a meal and water. Do not chew, crush, or break tablets., Disp: 120 tablet, Rfl: 6  ???  ondansetron (ZOFRAN) 8 MG tablet, Take 1 tablet (8 mg total) by mouth every twelve (12) hours as needed for nausea., Disp: 20 tablet, Rfl: 0       REVIEW OF SYSTEMS  Constitutional: No fever, sweats, or shaking chills. Has gained a few lbs since last visit,  ECOG Status is 3.   HEENT: No visual changes.  Ears feel clogged.  No changes in voice.  No mouth sores.     Pulmonary: No unusual cough, sore throat, or orthopnea. SOB on standing up.    Cardiovascular: Has history of coronary artery disease and MI.  No angina, or palpitations.  Gastrointestinal: Has nausea but no vomiting, dysphagia, odynophagia, abdominal pain, diarrhea, or constipation. No change in bowel habits.   Genitourinary: No frequency, urgency, hematuria, or dysuria.   Musculoskeletal: No arthralgias or myalgias; no back pain;  no joint swelling, pain or instability.   Hematologic: No bleeding tendency or easy bruisability.   Endocrine: Intolerance to cold; no thyroid disease or diabetes mellitus.   Skin: No rash, scaling, sores, lumps, or jaundice.  Vascular: No peripheral arterial or venous thromboembolic disease.   Psychological: No anxiety, depression, or mood changes; no mental health illnesses.   Neurological: Has dizziness, lightheadedness.  No syncope, or near syncopal episodes; no numbness or tingling in the fingers or toes.     PHYSICAL EXAMINATION  BP 158/85  - Pulse 85  - Temp 36.6 ??C (97.9 ??F) (Oral)  - Resp 16  - Wt 68.3 kg (150 lb 9.6 oz)  - SpO2 100%  - BMI 22.05 kg/m??      General:   Comfortable when under the blanket.  Here with wife.  Tired appearing   Eyes:   Pupil equal round reacting to light and accomodation.  Extra occular muscles intact, and sclera clear and without icteris.   ENT:   Oropharynx without mucositis, or thrush.  Mucosa dry.  Right TM occluded with cerumen.  Left TM with good light reflex.    Neck:   Supple without any enlargement, no thyromegaly, bruit, or jugular venous distention.   Lymph Nodes:  No adenopathy (cervical, supraclavicular, axillary, inguinal)   Cardiovascular:  RRR, normal S1, S2 without murmur, rub, or gallop.  Pulses 2+ equal on both sides without any bruits.   Lungs:  Clear to auscultation bilaterally, without wheezes/crackles/rhonchi.  Good air movement.   Skin:    Not assessed   Breast:    Not assessed   Psychiatry:   Alert and oriented to person, place, and time Abdomen:   Normoactive bowel sounds, abdomen soft, non-tender and not distended, no Hepatosplenomegaly or masses.  Liver normal in size, no rebound or guarding.    Genito Urinary:   Not assessed   Rectal:    Not assessed   Extremities:   No bilateral cyanosis, clubbing or edema.  No rash, lesions, or petechiae.   Musculo Skeletal:   No joint tenderness, deformity, effusions.  No spine or costovertebral angle tenderness.  Full range of motion in shoulder, elbow, hip, knee, ankle, hands and feet.  Decreased muscle mass.   Neurological:  Alert and oriented to person, place and time.  Cranial nerves II-XII grossly intact, unsteady gait, normal sensation throughout, normal cerebellar function.           LABORATORY STUDIES  Hospital Outpatient Visit on 12/03/2017   Component Date Value Ref Range Status   ??? Unit Blood Type 12/04/2017 O Pos   Final   ??? ISBT Number 12/04/2017 [id number]   Final   ??? Unit # 12/04/2017 [id number]   Final   ??? Status 12/04/2017 Transfused   Final   ??? Product ID 12/04/2017 Platelets   Final   ??? PRODUCT CODE 12/04/2017 [id number]   Final   ??? Platelet 12/03/2017 50* 150 - 440 10*9/L Final   Hospital Outpatient Visit on 12/03/2017   Component Date Value Ref Range Status   ??? Cytogenetics Test, Other 12/03/2017 Collected   Final   ??? Sendout Test Name 12/03/2017 SEE COMMENT   Preliminary    Minimal Residual Disease (Bone Marrow Aspirate) sent to Hematologics.   ??? Collection 12/03/2017  Collected   Final   Lab on 12/03/2017   Component Date Value Ref Range Status   ??? WBC 12/03/2017 0.8* 4.5 - 11.0 10*9/L Final   ??? RBC 12/03/2017 2.79* 4.50 - 5.90 10*12/L Final   ??? HGB 12/03/2017 8.5* 13.5 - 17.5 g/dL Final   ??? HCT 53/66/4403 24.3* 41.0 - 53.0 % Final   ??? MCV 12/03/2017 86.8  80.0 - 100.0 fL Final   ??? MCH 12/03/2017 30.4  26.0 - 34.0 pg Final   ??? MCHC 12/03/2017 35.0  31.0 - 37.0 g/dL Final   ??? RDW 47/42/5956 14.1  12.0 - 15.0 % Final   ??? MPV 12/03/2017 11.8* 7.0 - 10.0 fL Final   ??? Platelet 12/03/2017 24* 150 - 440 10*9/L Final    Questionable result confirmed. Specimen integrity/identity confirmed.   ??? nRBC 12/03/2017 5* <=4 /100 WBCs Final   ??? Antibody Screen 12/03/2017 NEG   Final   ??? Blood Type 12/03/2017 B POS   Final   ??? Absolute Neutrophils 12/03/2017 0.0* 2.0 - 7.5 10*9/L Final   ??? Absolute Lymphocytes 12/03/2017 0.4* 1.5 - 5.0 10*9/L Final   ??? Absolute Monocytes 12/03/2017 0.4  0.2 - 0.8 10*9/L Final   ??? Absolute Eosinophils 12/03/2017 0.0  0.0 - 0.4 10*9/L Final   ??? Absolute Basophils 12/03/2017 0.0  0.0 - 0.1 10*9/L Final   ??? Smear Review Comments 12/03/2017 See Comment* Undefined Final    Irregularly contracted RBCs present.    Office Visit on 11/29/2017   Component Date Value Ref Range Status   ??? Sodium 11/29/2017 137  mmol/L Final   ??? Potassium 11/29/2017 3.8  mmol/L Final   ??? Chloride 11/29/2017 97  mmol/L Final    low   ??? CO2 11/29/2017 26.5  mmol/L Final   ??? BUN 11/29/2017 18  mg/dL Final   ??? Creatinine 11/29/2017 0.93  mg/dL Final   ??? Glucose 38/75/6433 119  mg/dL Final    high   ??? Calcium 11/29/2017 9.2  mg/dL Final   ??? Total Protein 11/29/2017 6.4  g/dL Final   ??? Total Bilirubin 11/29/2017 1.3  mg/dL Final    high   ??? AST 11/29/2017 24  U/L Final   ??? ALT 11/29/2017 18  U/L Final   ??? Alkaline Phosphatase 11/29/2017 146  U/L Final    high   ??? EGFR MDRD Non Af Amer 11/29/2017 60  mL/min/1.5m2 Final   ??? EGFR MDRD Af Amer 11/29/2017 60  mL/min/1.67m2 Final   ??? Albumin 11/29/2017 3.50   Final   ??? Anion Gap 11/29/2017 17   Final    high   ??? WBC 11/29/2017 0.6  10*9/L Final    low   ??? RBC 11/29/2017 2.28  10*12/L Final    low   ??? HGB 11/29/2017 6.7  g/dL Final    low   ??? HCT 11/29/2017 20.0  % Final    low   ??? MCV 11/29/2017 87.7  fL Final   ??? MCH 11/29/2017 29.4  pg Final   ??? MCHC 11/29/2017 33.5  g/dL Final   ??? RDW 29/51/8841 13.0  % Final   ??? MPV 11/29/2017 12.2  fL Final    high   ??? Platelet 11/29/2017 50  10*9/L Final    low   ??? nRBC 11/29/2017 5  /100 WBCs Final   ??? Neutrophils % 11/29/2017 6.2  % Final   ??? Lymphocytes % 11/29/2017 70.3  % Final   ??? Monocytes %  11/29/2017 20.3  % Final   ??? Eosinophils % 11/29/2017 1.6  % Final   ??? Basophils % 11/29/2017 0.0  % Final   ??? Absolute Neutrophils 11/29/2017 0.0  10*9/L Final    low   ??? Absolute Lymphocytes 11/29/2017 0.5  10*9/L Final    low   ??? Absolute Monocytes 11/29/2017 0.1  10*9/L Final   ??? Absolute Eosinophils 11/29/2017 0.0  10*9/L Final   ??? Absolute Basophils 11/29/2017 0.0  10*9/L Final   ??? Microcytosis 11/29/2017 Marked* Not Present Final   ??? Anisocytosis 11/29/2017 Marked* Not Present Final   ??? Hypochromasia 11/29/2017 Marked* Not Present Final   ??? Bands Manual 11/29/2017 2   Final   ??? SEGS MAN 11/29/2017 6   Final    low   ??? Lymphs 11/29/2017 87   Final    high   ??? Monocytes 11/29/2017 4   Final   ??? Eosinophils Manual 11/29/2017 1   Final   ??? RBC Morphology 11/29/2017 test check   Final    NPY9   ??? Magnesium 11/29/2017 1.9  mg/dL Final   ??? Phosphorus 11/29/2017 3.7  mg/dL Final   ??? ABO/Rh Typing 11/29/2017 B POSTIVE   Final   ??? Antibody Screen 11/29/2017 NEGATIVE   Final   ??? Antibody ID 11/29/2017 NOT DONE   Final   ??? 2 Unit Crossmatch 11/29/2017 2 UNITS   Final    PATIENT RECIEVED 2 UNITS O POSITVE 1ST UNIT ON 11/29/17 2ND UNIT 11/30/17   Lab on 11/26/2017   Component Date Value Ref Range Status   ??? WBC 11/26/2017 0.4  10*9/L Final    LOW   ??? RBC 11/26/2017 2.35  10*12/L Final    LOW   ??? HGB 11/26/2017 7.0  g/dL Final    LOW   ??? HCT 11/26/2017 20.5  % Final    LOW   ??? MCV 11/26/2017 87.2  fL Final   ??? MCH 11/26/2017 29.8  pg Final   ??? MCHC 11/26/2017 34.1  g/dL Final   ??? RDW 20/25/4270 12.9  % Final   ??? MPV 11/26/2017 11.9  fL Final    HIGH   ??? Platelet 11/26/2017 41  10*9/L Final    LOW   ??? nRBC 11/26/2017 5  /100 WBCs Final   ??? Neutrophils % 11/26/2017 10.6  % Final   ??? Lymphocytes % 11/26/2017 44.7  % Final   ??? Monocytes % 11/26/2017 42.1  % Final   ??? Eosinophils % 11/26/2017 0.0  % Final   ??? Basophils % 11/26/2017 0.0  % Final   ??? Absolute Neutrophils 11/26/2017 0.0  10*9/L Final    LOW   ??? Absolute Lymphocytes 11/26/2017 0.2  10*9/L Final    LOW   ??? Absolute Monocytes 11/26/2017 0.2  10*9/L Final   ??? Absolute Eosinophils 11/26/2017 0.0  10*9/L Final   ??? Absolute Basophils 11/26/2017 0.0  10*9/L Final   ??? Anisocytosis 11/26/2017 Marked* Not Present Corrected    OCC   ??? Hypochromasia 11/26/2017 Moderate* Not Present Corrected   ??? Bands Manual 11/26/2017 1   Final   ??? SEGS MAN 11/26/2017 4   Final    LOW   ??? Lymphs 11/26/2017 60   Final    HIGH   ??? Monocytes Manual 11/26/2017 33   Final    HIGH   ??? Metamyelocytes Manual 11/26/2017 1   Final   ??? Myelocytes 11/26/2017 1   Final   ??? Nucleated  RBC 11/26/2017 4  /100 WBCs Final    HIGH   ??? PLT Morphology 11/26/2017 test check   Final    DECR   ??? Giant PLTs 11/26/2017 Occasional   Final   ??? Sodium 11/26/2017 136  mmol/L Final   ??? Potassium 11/26/2017 3.9  mmol/L Final   ??? Chloride 11/26/2017 97  mmol/L Final    LOW   ??? CO2 11/26/2017 28.0  mmol/L Final   ??? BUN 11/26/2017 23  mg/dL Final    HIGH   ??? Creatinine 11/26/2017 0.88  mg/dL Final   ??? Glucose 16/08/9603 124  mg/dL Final    HIGH   ??? Calcium 11/26/2017 9.1  mg/dL Final   ??? Total Protein 11/26/2017 6.2  g/dL Final   ??? Total Bilirubin 11/26/2017 1.2  mg/dL Final    HIGH   ??? AST 11/26/2017 22  U/L Final   ??? ALT 11/26/2017 18  U/L Final   ??? Alkaline Phosphatase 11/26/2017 144  U/L Final    HIGH   ??? EGFR MDRD Non Af Amer 11/26/2017 60  mL/min/1.61m2 Final   ??? EGFR MDRD Af Amer 11/26/2017 60  mL/min/1.99m2 Final   ??? Albumin 11/26/2017 3.30   Final    LOW   ??? Anion Gap 11/26/2017 15   Final   ??? Uric Acid 11/26/2017 2.5  mg/dL Final    LOW   ??? Phosphorus 11/26/2017 4.3  mg/dL Final   ??? Magnesium 54/07/8118 1.9  mg/dL Final   Admission on 14/78/2956, Discharged on 11/25/2017   No results displayed because visit has over 200 results.      Lab Requisition on 11/19/2017   Component Date Value Ref Range Status   ??? Case Report 11/19/2017    Final Value:Surgical Pathology Report                         Case: [id number]                                Authorizing Provider:  Avie Arenas,   Collected:           11/19/2017 1201                                     MD                                                                           Pathologist:           Michelene Heady, MD  Received:            11/19/2017 1201              Specimen:    Bone Marrow                                                                               ???  Final Diagnosis 11/19/2017    Final                    Value:This result contains rich text formatting which cannot be displayed here.   ??? Comment 11/19/2017    Final                    Value:This result contains rich text formatting which cannot be displayed here.   ??? Clinical History 11/19/2017    Final                    Value:This result contains rich text formatting which cannot be displayed here.   ??? Ancillary Studies 11/19/2017    Final                    Value:This result contains rich text formatting which cannot be displayed here.   ??? Gross Description 11/19/2017    Final                    Value:This result contains rich text formatting which cannot be displayed here.   ??? Microscopic Description 11/19/2017    Final                    Value:This result contains rich text formatting which cannot be displayed here.   ??? Disclaimer 11/19/2017    Final                    Value:This result contains rich text formatting which cannot be displayed here.   Lab on 11/11/2017   Component Date Value Ref Range Status   ??? Uric Acid 11/11/2017 5.0  4.0 - 9.0 mg/dL Final   ??? LDH 11/11/2017 1532* 338 - 610 U/L Final   ??? Phosphorus 11/11/2017 4.8* 2.9 - 4.7 mg/dL Final   ??? Sodium 40/98/1191 137  135 - 145 mmol/L Final   ??? Potassium 11/11/2017 3.7  3.5 - 5.0 mmol/L Final   ??? Chloride 11/11/2017 98  98 - 107 mmol/L Final   ??? CO2 11/11/2017 28.0  22.0 - 30.0 mmol/L Final   ??? BUN 11/11/2017 19  7 - 21 mg/dL Final   ??? Creatinine 11/11/2017 1.07  0.70 - 1.30 mg/dL Final   ??? BUN/Creatinine Ratio 11/11/2017 18   Final   ??? EGFR MDRD Non Af Amer 11/11/2017 >=60  >=60 mL/min/1.53m2 Final   ??? EGFR MDRD Af Amer 11/11/2017 >=60  >=60 mL/min/1.80m2 Final   ??? Anion Gap 11/11/2017 11  9 - 15 mmol/L Final   ??? Glucose 11/11/2017 136  65 - 179 mg/dL Final   ??? Calcium 47/82/9562 9.2  8.5 - 10.2 mg/dL Final   ??? Albumin 13/06/6577 3.5  3.5 - 5.0 g/dL Final   ??? Total Protein 11/11/2017 6.7  6.5 - 8.3 g/dL Final   ??? Total Bilirubin 11/11/2017 1.0  0.0 - 1.2 mg/dL Final   ??? AST 46/96/2952 69* 19 - 55 U/L Final   ??? ALT 11/11/2017 60  19 - 72 U/L Final   ??? Alkaline Phosphatase 11/11/2017 261* 38 - 126 U/L Final   ??? Check Type 11/11/2017 Needed Type Check   Final    2nd sample is required for [facility] confirmation.   ??? Antibody Screen 11/11/2017 NEG   Final   ??? ABO Grouping 11/11/2017 Invalid   Final   ??? Blood Type 11/11/2017 B POS   Final   ??? WBC 11/11/2017  34.8* 4.5 - 11.0 10*9/L Final   ??? RBC 11/11/2017 2.78* 4.50 - 5.90 10*12/L Final   ??? HGB 11/11/2017 8.6* 13.5 - 17.5 g/dL Final   ??? HCT 16/08/9603 25.3* 41.0 - 53.0 % Final   ??? MCV 11/11/2017 90.9  80.0 - 100.0 fL Final   ??? MCH 11/11/2017 30.9  26.0 - 34.0 pg Final   ??? MCHC 11/11/2017 34.0  31.0 - 37.0 g/dL Final   ??? RDW 54/07/8118 15.4* 12.0 - 15.0 % Final   ??? MPV 11/11/2017 10.6* 7.0 - 10.0 fL Final   ??? Platelet 11/11/2017 175  150 - 440 10*9/L Final   ??? Case Report 11/11/2017    Final                    Value:Molecular Genetics Report                         Case: [id number]                                 Authorizing Provider:  Windell Norfolk Zeidner,   Collected:           11/11/2017 1217                                     MD                                                                           Ordering Location:     [facility]    Received:            11/11/2017 1322                                     [employer] [place] Pathologist:           Claudia Desanctis,                                                                            MD                                                                           Specimen:    Blood                                                                                     ???  Myeloid Mutation Panel- AML Results 11/11/2017    Final                    Value:This result contains rich text formatting which cannot be displayed here.   ??? Case Report 11/11/2017    Final                    Value:Molecular Genetics Report                         Case: [id number]                                 Authorizing Provider:  Windell Norfolk Zeidner,   Collected:           11/11/2017 1217                                     MD                                                                           Ordering Location:     [facility] ADULT ONCOLOGY LAB    Received:            11/11/2017 1322                                     [address] [place]                                                     Pathologist:           Hardie Shackleton, MD                                                     Specimen:    Blood                                                                                     ??? FLT3 DNA Analysis Results 11/11/2017    Final                    Value:This result contains rich text formatting which cannot be displayed here.   ??? FLT3-ITD Mutation  11/11/2017 Negative   Final   ??? FLT3-TKD D835 or I836 mutation  11/11/2017 Positive*  Final   ??? Case Report 11/11/2017    Corrected                    Value:Molecular Genetics Report                         Case: [id number]                                 Authorizing Provider:  Windell Norfolk Zeidner,   Collected:           11/11/2017 1217                                     MD                                                                           Ordering Location:     [facility] ADULT ONCOLOGY LAB    Received:            11/11/2017 1322 [address] [place]                                                     Pathologist:           Arman Filter, PhD                                                    Specimens:   A) - Blood                                                                                          B) - Blood                                                                                ??? Addendum 11/11/2017    Final                    Value:This result contains rich text formatting which cannot be displayed here.   ??? RNA Extract and Hold Results 11/11/2017    Final  Value:This result contains rich text formatting which cannot be displayed here.   ??? Pathologist Smear Interpretation 11/11/2017 Confirmed by Hemepath Specialist/Senior Tech   Final   ??? Blasts % 11/11/2017 12* <=0 % Final    Blasts Present. Immature monocytes present.    ??? Absolute Neutrophils 11/11/2017 7.7* 2.0 - 7.5 10*9/L Final   ??? Absolute Lymphocytes 11/11/2017 3.8  1.5 - 5.0 10*9/L Final   ??? Absolute Monocytes 11/11/2017 18.8* 0.2 - 0.8 10*9/L Final   ??? Absolute Eosinophils 11/11/2017 0.3  0.0 - 0.4 10*9/L Final   ??? Absolute Basophils 11/11/2017 0.0  0.0 - 0.1 10*9/L Final   ??? Smear Review Comments 11/11/2017 See Comment* Undefined Final    Blasts Present. Immature monocytes present. Agranular neutrophils present. Myelocytes present-rare. Myeloperoxidase Deficiency Noted.    ??? Case Report 11/11/2017    Final                    Value:Molecular Genetics Report                         Case: [id number]                                 Authorizing Provider:  Windell Norfolk Zeidner,   Collected:           11/11/2017 1217                                     MD                                                                           Ordering Location:     [facility] ADULT ONCOLOGY LAB    Received:            11/11/2017 1322                                     [address] [place] Pathologist:           Claudia Desanctis,                                                                            MD                                                                           Specimens:   A) - Blood  B) - Blood                                                                                ??? NPM1, Quantitative Type A Results 11/11/2017    Final                    Value:This result contains rich text formatting which cannot be displayed here.        IMAGING STUDIES  No current imaging results    The total time spent discussing the previous history, imaging studies, laboratory studies, the role and rationale of AML therapy, and discussion was 60 minutes.  At least 50% of that time was spent in answering questions and counseling.    FOLLOW UP: AS DIRECTED       Ccc:

## 2017-12-07 NOTE — Unmapped (Signed)
You are currently being treated for acute leukemia with the drug venetoclax.  We are awaiting the results of the bone marrow biopsy and aspirate that were performed on Friday.  The results of that tests will likely be available to you this Thursday.  I would like to see you for follow-up appointment on Friday so we may discuss what the biopsy has shown and how it may affect her treatment.  You may stop the amoxicillin for treatment of the possible ear infection and instead we will start Augmentin 875 mg p.o. twice a day for 14 days.  We will continue transfusion support for packed red blood cells and platelets as needed.  I have encouraged you and your wife to speak with the physicians at [facility] regarding goals of care and prognosis.

## 2017-12-07 NOTE — Unmapped (Signed)
Mr. Arthur Bates seen today at our clinic for follow up care of Acute Myeloid Leukemia. Mr. Arthur Bates does not have any complaints at this time other than ears feeling like they need to be popped. Per wife patient was seen at the [place] last week and was given a prescription of Ammoxicillin for 5 days for an ear infection. Patient states his appetite is getting better, a 5 lb weight gain was noted from his last visit on 11/29/2017.

## 2017-12-08 ENCOUNTER — Encounter: Admit: 2017-12-08 | Discharge: 2017-12-09 | Payer: PRIVATE HEALTH INSURANCE

## 2017-12-08 DIAGNOSIS — C9210 Chronic myeloid leukemia, BCR/ABL-positive, not having achieved remission: Secondary | ICD-10-CM | POA: Diagnosis not present

## 2017-12-08 DIAGNOSIS — R627 Adult failure to thrive: Secondary | ICD-10-CM | POA: Diagnosis not present

## 2017-12-08 DIAGNOSIS — I10 Essential (primary) hypertension: Secondary | ICD-10-CM | POA: Diagnosis not present

## 2017-12-08 DIAGNOSIS — M1710 Unilateral primary osteoarthritis, unspecified knee: Secondary | ICD-10-CM | POA: Diagnosis not present

## 2017-12-08 DIAGNOSIS — D6189 Other specified aplastic anemias and other bone marrow failure syndromes: Secondary | ICD-10-CM | POA: Diagnosis not present

## 2017-12-08 NOTE — Unmapped (Signed)
Patient arrived to clinic ambulatory.  Vital signs obtained.  Peripheral IV started to right arm with positive blood return.  Transfusion given as ordered.  Patient tolerated well without complaint.  IV flushed and d/c per protocol.  Patient d/c to home and aware of return appointments.

## 2017-12-09 ENCOUNTER — Ambulatory Visit: Admit: 2017-12-09 | Discharge: 2017-12-09 | Payer: PRIVATE HEALTH INSURANCE

## 2017-12-09 ENCOUNTER — Ambulatory Visit
Admit: 2017-12-09 | Discharge: 2017-12-09 | Payer: PRIVATE HEALTH INSURANCE | Attending: Hematology & Oncology | Primary: Hematology & Oncology

## 2017-12-09 ENCOUNTER — Other Ambulatory Visit: Admit: 2017-12-09 | Discharge: 2017-12-09 | Payer: PRIVATE HEALTH INSURANCE

## 2017-12-09 DIAGNOSIS — Z9221 Personal history of antineoplastic chemotherapy: Secondary | ICD-10-CM | POA: Diagnosis not present

## 2017-12-09 DIAGNOSIS — I10 Essential (primary) hypertension: Secondary | ICD-10-CM | POA: Diagnosis not present

## 2017-12-09 DIAGNOSIS — E785 Hyperlipidemia, unspecified: Secondary | ICD-10-CM | POA: Diagnosis not present

## 2017-12-09 DIAGNOSIS — K219 Gastro-esophageal reflux disease without esophagitis: Secondary | ICD-10-CM | POA: Diagnosis not present

## 2017-12-09 DIAGNOSIS — I2510 Atherosclerotic heart disease of native coronary artery without angina pectoris: Secondary | ICD-10-CM | POA: Diagnosis not present

## 2017-12-09 DIAGNOSIS — R627 Adult failure to thrive: Secondary | ICD-10-CM | POA: Diagnosis not present

## 2017-12-09 DIAGNOSIS — Z79899 Other long term (current) drug therapy: Secondary | ICD-10-CM | POA: Diagnosis not present

## 2017-12-09 DIAGNOSIS — C9200 Acute myeloblastic leukemia, not having achieved remission: Secondary | ICD-10-CM | POA: Diagnosis not present

## 2017-12-09 DIAGNOSIS — D6189 Other specified aplastic anemias and other bone marrow failure syndromes: Secondary | ICD-10-CM | POA: Diagnosis not present

## 2017-12-09 DIAGNOSIS — C9300 Acute monoblastic/monocytic leukemia, not having achieved remission: Secondary | ICD-10-CM

## 2017-12-09 LAB — COMPREHENSIVE METABOLIC PANEL
ALBUMIN: 3 g/dL — ABNORMAL LOW (ref 3.5–5.0)
ALKALINE PHOSPHATASE: 104 U/L (ref 38–126)
ALT (SGPT): 50 U/L (ref 19–72)
ANION GAP: 8 mmol/L — ABNORMAL LOW (ref 9–15)
AST (SGOT): 25 U/L (ref 19–55)
BILIRUBIN TOTAL: 0.8 mg/dL (ref 0.0–1.2)
BLOOD UREA NITROGEN: 24 mg/dL — ABNORMAL HIGH (ref 7–21)
BUN / CREAT RATIO: 25
CALCIUM: 9.3 mg/dL (ref 8.5–10.2)
CHLORIDE: 108 mmol/L — ABNORMAL HIGH (ref 98–107)
CO2: 23 mmol/L (ref 22.0–30.0)
CREATININE: 0.95 mg/dL (ref 0.70–1.30)
EGFR MDRD AF AMER: >=60 mL/min/{1.73_m2} (ref >=60)
EGFR MDRD NON AF AMER: >=60 mL/min/{1.73_m2} (ref >=60)
GLUCOSE RANDOM: 102 mg/dL (ref 65–179)
POTASSIUM: 4.4 mmol/L (ref 3.5–5.0)
PROTEIN TOTAL: 5.4 g/dL — ABNORMAL LOW (ref 6.5–8.3)

## 2017-12-09 LAB — PLATELET COUNT: Lab: 32 — ABNORMAL LOW

## 2017-12-09 LAB — ANION GAP: Anion gap 3:SCnc:Pt:Ser/Plas:Qn:: 8 — ABNORMAL LOW

## 2017-12-09 NOTE — Unmapped (Signed)
Labs drawn and sent for analysis.  Care provided by  Einar Crow, RN

## 2017-12-09 NOTE — Unmapped (Signed)
I spoke with Arthur Bates wife and she said he has plenty of Venclexta due to dose reduction.  I will call again to schedule a refill in a month.

## 2017-12-09 NOTE — Unmapped (Signed)
Patient has been approved to receive free Noxafil from [facility]. Medication will be shipped directly from [facility] to the patient.

## 2017-12-09 NOTE — Unmapped (Signed)
[facility]ukemia Clinic Follow-up    Patient Name: Arthur Bates  Patient Age: 72 y.o.  Encounter Date: 12/09/2017    Primary Care Provider:  Richardean Chimera, MD    Referring Physician:  Richardean Chimera, MD  [address]  [place], [place] [address]    Reason for visit: F/U visit for AML    Assessment:  Arthur Bates is a 72 yo M with AML initially diagnosed in October, 2018, normal cytogenetics, who was diagnosed at [facility] and s/p 1 cycle of Azacitidine followed by failure to thrive and multiple hospitalizations leading to a delay in cycle 2. He then received cycle 2 of Azacitidine + Venetoclax in 10/2017, and myeloid panel from [facility] revealed mutations in NPM1, FLT3-D835, BCOR, and WT1. He has subsequently had pancytopenia with a bone marrow biopsy revealing persistent AML presents to clinic for follow up.    Arthur Bates feels better and is continuing to clinically improve with rehab. He is tolerating Azacitidine and Venetoclax well without complications. I personally reviewed Arthur Bates bone marrow results from last week. He has evidence of persistent AML with increase in blasts up to 32%. Although we do not have a baseline bone marrow biopsy prior to cycle 2, his diagnostic bone marrow biopsy had approximately 40% blasts before cycle 1 of Azacitidine. He has thus not had a response yet though he has only been on Venetoclax ~3 weeks. Although the median time to response is about 1 month with Azacitidine + Venetoclax, there are a proportion of patients who respond after the first few cycles of treatment. I would recommend continuing another 2 cycles of Azacitidine + Venetoclax and rechecking a bone marrow biopsy after cycle 4 of Azacitidine. I would recommend continuing Azacitidine despite his cytopenias as these are likely disease-related and not chemotherapy-induced. He will continue his Azacitidine locally at [facility] per patient preference. If he does not respond to Azacitidine + Venetoclax, he would be a candidate for clinical trials if his ECOG PS improves or Gilteritinib given his FLT3 mutation.     He will receive 1 unit of blood today for hemoglobin <8 g/dL and 1 unit of platelets. I would recommend labs 2x/week locally with hemoglobin goal >8 g/dL and platelet goal >62.     I have personally discussed my findings with Dr. Angelene Giovanni. Dr. Angelene Giovanni will attempt to get him 1 unit of PRBC tomorrow as he will only receive 1 unit of PRBC today.     Plan and Recommendations:  1) Platelets and 1 unit of PRBC's today  2) Cycle 3 of Azacitidine on Monday, January 28th  3) Cont. Venetoclax 100 mg daily  4) RTC in 1 month  5) BM biopsy after cycle 5 of Azacitidine    History of Present Illness:  Oncology History    Diagnosis: AML    Presentation: Pancytopenia    Bone marrow biopsy: 09/14/2017  Hypercellular (>95%) marrow with approximately 40% blasts  Dysmegakaryopoiesis  Aspirate- predominance of blasts- no manual aspirate differential given    Flow- 39% blasts- CD34+ (subset), CD13, CD15, CD7, CD11c ,CD33, CD117, HLA-DR    Cytogenetics: Normal    Molecular Mutations: Not done    Treatment:  Azacitidine 75 mg/m2 SQ days 1-7  Cycle 1: 11/07    C/b multiple infections and hospitalizations.    Myeloid Panel from [facility]:  BCOR: c.2124delC (p.Gly709AlafsTer)  FLT3: c.2505T>G (p.Asp835Glu) [FLT3-TKD]  NPM1: c.860_863dupTCTG (p.Trp288CysfsTer12) [Type A Mutation]  WT1: c.1144delG (p.Ala382HisfsTer67)  Cycle 2: Azacitidine  - 11/12/2017    Venetoclax initiated on 11/19/2017      Final Diagnosis   Date Value Ref Range Status   12/03/2017   Final    Bone marrow, right iliac, aspiration and biopsy  -  Hypercellular bone marrow (80%) with persistent involvement by acute myeloid leukemia (32% blasts by manual aspirate differentiation).  -  Cytogenetic studies are pending.  -  Flow cytometric MRD analysis is pending.  -  NPM1 quantitation study is pending.       Flow MRD = Undetectable        Acute myeloid leukemia (AML), M5 (CMS-HCC)    09/17/2017 Initial Diagnosis     Acute myeloid leukemia (AML), M5 (CMS-HCC)          Interval History:  Since last seen here, Arthur Bates states he is doing better. He is in the [employer] until this weekend. If he stands up for long periods of time, he gets SOB and dizzy. He walks with a cane. His appetite is better and is eating without any issues. No nausea, no vomiting, no diarrhea.     Otherwise, he denies new constitutional symptoms such as anorexia, weight loss, night sweats or unexplained fevers.  Furthermore, he denies symptoms of marrow failure: unexplained bleeding or bruising, recurrent or unexplained intercurrent infections, dyspnea on exertion, lightheadedness, palpitations or chest pain.  There have been no new or unexplained pains or self-identified masses, swelling or enlarged lymph nodes.    Past Medical, Surgical and Family History were reviewed and pertinent updates were made in the Electronic Medical Record    Review of Systems:  Other than as reported above in the interim history, the balance of a full 12-system review was performed and unremarkable.    ECOG Performance Status: 2    Past Medical History:  Past Medical History:   Diagnosis Date   ??? Coronary artery disease    ??? Depression    ??? Dissecting aortic aneurysm (any part), abdominal (CMS-HCC)    ??? ED (erectile dysfunction)    ??? GERD (gastroesophageal reflux disease)    ??? HTN (hypertension)    ??? Hyperlipidemia    ??? Ischemic heart disease    ??? Osteoarthritis of knee        Medications:  Current Outpatient Prescriptions   Medication Sig Dispense Refill   ??? acyclovir (ZOVIRAX) 400 MG tablet Take 400 mg by mouth Two (2) times a day.     ??? allopurinol (ZYLOPRIM) 300 MG tablet Take 1 tablet (300 mg total) by mouth daily. 30 tablet 0   ??? amoxicillin-clavulanate (AUGMENTIN) 875-125 mg per tablet Take 1 tablet by mouth Two (2) times a day. for 14 days 28 tablet 0   ??? famotidine (PEPCID) 20 MG tablet Take 1 tablet (20 mg total) by mouth Two (2) times a day. 60 tablet 0   ??? levoFLOXacin (LEVAQUIN) 500 MG tablet Take 500 mg by mouth daily.     ??? mirtazapine (REMERON) 15 MG tablet Take 1 tablet (15 mg total) by mouth nightly. 90 tablet 3   ??? ondansetron (ZOFRAN) 8 MG tablet Take 1 tablet (8 mg total) by mouth every twelve (12) hours as needed for nausea. 20 tablet 0   ??? polyethylene glycol (MIRALAX) 17 gram packet MIX 1 PACKET OF POWDER (17 GM) IN 8 OZ OF JUICE OR WATER AND DRINK BY MOUTH DAILY  6   ??? posaconazole (NOXAFIL) 100 mg TbEC delayed released tablet Take 300 mg  by mouth daily. 90 tablet 2   ??? senna (SENOKOT) 8.6 mg tablet Take 2 tablets by mouth Two (2) times a day. Hold for diarrhea. 120 tablet 0   ??? venetoclax (VENCLEXTA) 100 mg tablet Take 1 tablet (100 mg total) by mouth daily with evening meal. Take with a meal and water. Do not chew, crush, or break tablets. 120 tablet 6     No current facility-administered medications for this visit.        Vital Signs:  BSA: There is no height or weight on file to calculate BSA.  There were no vitals filed for this visit.      Physical Exam:  Objective:   Physical Exam: General: Resting in no apparent distress  HEENT:  Pupils are equal, round and reactive to light and accomodation.  There is no scleral icterus and no conjunctival injection.  The oral mucosa does not demonstrate ulceration, erythema, exudate or purpura.    Lymph node exam:  No lymphadenopathy in the occipital, auricular, anterior/posterior cervical regions.  Heart:  Regular rate and rhythm.  Normal S1 and S2 without S3 or S4.  There are no murmurs, gallops or rubs.  Lungs:  Breathing is unlabored and patient is speaking full sentences with ease.  No stridor.  Auscultation of lung fields reveals normal air movement without rales, ronchi or crackles.    Abdomen:  No distention or pain on palpation. There is no palpable hepatomegaly or splenomegaly.  No palpable masses.  Skin:  No rashes, petechiae or purpura.  No areas of skin breakdown.  Musculoskeletal:  There are no grossly-evident joint effusions or deformities.  Range of motion about the shoulder, elbow, hips and knees is grossly normal.    Psychiatric:  Alert and oriented to person, place, time and situation.  Range of affect is appropriate.    Neurologic:   Strength 5/5 in upper and lower extremities. Gait is normal.   Extremities:  Appear well-perfused, there is no clubbing, edema or cyanosis.      Relevant Laboratory, radiology and pathology results:  Recent Results (from the past 24 hour(s))   Comprehensive Metabolic Panel    Collection Time: 12/09/17  3:28 PM   Result Value Ref Range    Sodium 139 135 - 145 mmol/L    Potassium 4.4 3.5 - 5.0 mmol/L    Chloride 108 (H) 98 - 107 mmol/L    CO2 23.0 22.0 - 30.0 mmol/L    BUN 24 (H) 7 - 21 mg/dL    Creatinine 1.09 3.23 - 1.30 mg/dL    BUN/Creatinine Ratio 25     EGFR MDRD Non Af Amer >=60 >=60 mL/min/1.25m2    EGFR MDRD Af Amer >=60 >=60 mL/min/1.32m2    Anion Gap 8 (L) 9 - 15 mmol/L    Glucose 102 65 - 179 mg/dL    Calcium 9.3 8.5 - 55.7 mg/dL    Albumin 3.0 (L) 3.5 - 5.0 g/dL    Total Protein 5.4 (L) 6.5 - 8.3 g/dL    Total Bilirubin 0.8 0.0 - 1.2 mg/dL    AST 25 19 - 55 U/L    ALT 50 19 - 72 U/L    Alkaline Phosphatase 104 38 - 126 U/L   CBC w/ Differential    Collection Time: 12/09/17  3:28 PM   Result Value Ref Range    WBC 0.7 (L) 4.5 - 11.0 10*9/L    RBC 2.36 (L) 4.50 - 5.90 10*12/L    HGB 7.1 (L)  13.5 - 17.5 g/dL    HCT 09.8 (L) 11.9 - 53.0 %    MCV 87.8 80.0 - 100.0 fL    MCH 30.3 26.0 - 34.0 pg    MCHC 34.5 31.0 - 37.0 g/dL    RDW 14.7 82.9 - 56.2 %    MPV 12.2 (H) 7.0 - 10.0 fL    Platelet 22 (L) 150 - 440 10*9/L   Type and Screen    Collection Time: 12/09/17  4:01 PM   Result Value Ref Range    Antibody Screen NEG     ABO Grouping Invalid    ABO/RH    Collection Time: 12/09/17  4:01 PM   Result Value Ref Range    Blood Type B POS    Prepare Platelet Pheresis    Collection Time: 12/09/17  5:13 PM   Result Value Ref Range Unit Blood Type A Pos     ISBT Number 6200     Unit # [id number]     Status Issued     Product ID Platelets     PRODUCT CODE [id number]    Prepare RBC    Collection Time: 12/09/17  6:31 PM   Result Value Ref Range    Crossmatch Compatible     Unit Blood Type B Pos     ISBT Number 7300     Unit # [id number]     Status Issued     Spec Expiration [id number]     Product ID Red Blood Cells     PRODUCT CODE [id number]     Crossmatch Compatible     Unit Blood Type B Pos     ISBT Number 7300     Unit # [id number]     Status Ready     Spec Expiration [id number]     Product ID Red Blood Cells     PRODUCT CODE [id number]                  Cindra Presume, MD

## 2017-12-10 ENCOUNTER — Encounter: Admit: 2017-12-10 | Discharge: 2017-12-10 | Payer: PRIVATE HEALTH INSURANCE

## 2017-12-10 DIAGNOSIS — Z955 Presence of coronary angioplasty implant and graft: Secondary | ICD-10-CM | POA: Diagnosis not present

## 2017-12-10 DIAGNOSIS — C9300 Acute monoblastic/monocytic leukemia, not having achieved remission: Secondary | ICD-10-CM | POA: Diagnosis not present

## 2017-12-10 DIAGNOSIS — Z9889 Other specified postprocedural states: Secondary | ICD-10-CM | POA: Diagnosis not present

## 2017-12-10 DIAGNOSIS — D61818 Other pancytopenia: Secondary | ICD-10-CM | POA: Diagnosis not present

## 2017-12-10 DIAGNOSIS — Z888 Allergy status to other drugs, medicaments and biological substances status: Secondary | ICD-10-CM | POA: Diagnosis not present

## 2017-12-10 DIAGNOSIS — Z881 Allergy status to other antibiotic agents status: Secondary | ICD-10-CM | POA: Diagnosis not present

## 2017-12-10 DIAGNOSIS — Z91013 Allergy to seafood: Secondary | ICD-10-CM | POA: Diagnosis not present

## 2017-12-10 DIAGNOSIS — R42 Dizziness and giddiness: Secondary | ICD-10-CM | POA: Diagnosis not present

## 2017-12-10 DIAGNOSIS — Z79899 Other long term (current) drug therapy: Secondary | ICD-10-CM | POA: Diagnosis not present

## 2017-12-10 DIAGNOSIS — I2510 Atherosclerotic heart disease of native coronary artery without angina pectoris: Secondary | ICD-10-CM | POA: Diagnosis not present

## 2017-12-10 DIAGNOSIS — C9200 Acute myeloblastic leukemia, not having achieved remission: Secondary | ICD-10-CM | POA: Diagnosis not present

## 2017-12-10 DIAGNOSIS — I252 Old myocardial infarction: Secondary | ICD-10-CM | POA: Diagnosis not present

## 2017-12-10 DIAGNOSIS — K219 Gastro-esophageal reflux disease without esophagitis: Secondary | ICD-10-CM | POA: Diagnosis not present

## 2017-12-10 DIAGNOSIS — Z86010 Personal history of colonic polyps: Secondary | ICD-10-CM | POA: Diagnosis not present

## 2017-12-10 DIAGNOSIS — D6189 Other specified aplastic anemias and other bone marrow failure syndromes: Secondary | ICD-10-CM

## 2017-12-10 LAB — CBC W/ DIFFERENTIAL
BASOPHILS ABSOLUTE COUNT: 0 10*9/L
BASOPHILS RELATIVE PERCENT: 0 %
EOSINOPHILS ABSOLUTE COUNT: 0 10*9/L
EOSINOPHILS RELATIVE PERCENT: 0 %
HEMATOCRIT: 23.2 %
LYMPHOCYTES ABSOLUTE COUNT: 0.4 10*9/L
MEAN CORPUSCULAR HEMOGLOBIN CONC: 34.5 g/dL
MEAN CORPUSCULAR HEMOGLOBIN: 30 pg
MEAN CORPUSCULAR VOLUME: 86.9 fL
MEAN PLATELET VOLUME: 11.6 fL
MONOCYTES ABSOLUTE COUNT: 0.2 10*9/L
MONOCYTES RELATIVE PERCENT: 27.8 %
NEUTROPHILS ABSOLUTE COUNT: 0 10*9/L
NEUTROPHILS RELATIVE PERCENT: 7.4 %
NUCLEATED RED BLOOD CELLS: 0 /100{WBCs}
PLATELET COUNT: 35 10*9/L
RED BLOOD CELL COUNT: 2.67 10*12/L
WBC ADJUSTED: 0.5 10*9/L

## 2017-12-10 LAB — HEMOGLOBIN: Lab: 7.1 — ABNORMAL LOW

## 2017-12-10 LAB — CBC W/ AUTO DIFF
BASOPHILS ABSOLUTE COUNT: 0 10*9/L (ref 0.0–0.1)
EOSINOPHILS ABSOLUTE COUNT: 0 10*9/L (ref 0.0–0.4)
HEMATOCRIT: 20.7 % — ABNORMAL LOW (ref 41.0–53.0)
HEMOGLOBIN: 7.1 g/dL — ABNORMAL LOW (ref 13.5–17.5)
LARGE UNSTAINED CELLS: 45 % — ABNORMAL HIGH (ref 0–4)
LYMPHOCYTES ABSOLUTE COUNT: 0.3 10*9/L — ABNORMAL LOW (ref 1.5–5.0)
MEAN CORPUSCULAR HEMOGLOBIN CONC: 34.5 g/dL (ref 31.0–37.0)
MEAN CORPUSCULAR HEMOGLOBIN: 30.3 pg (ref 26.0–34.0)
MEAN PLATELET VOLUME: 12.2 fL — ABNORMAL HIGH (ref 7.0–10.0)
MONOCYTES ABSOLUTE COUNT: 0.1 10*9/L — ABNORMAL LOW (ref 0.2–0.8)
NUCLEATED RED BLOOD CELLS: 2 /100{WBCs} (ref <=4)
PLATELET COUNT: 22 10*9/L — ABNORMAL LOW (ref 150–440)
RED BLOOD CELL COUNT: 2.36 10*12/L — ABNORMAL LOW (ref 4.50–5.90)
RED CELL DISTRIBUTION WIDTH: 14.1 % (ref 12.0–15.0)
WBC ADJUSTED: 0.7 10*9/L — ABNORMAL LOW (ref 4.5–11.0)

## 2017-12-10 LAB — COMPREHENSIVE METABOLIC PANEL
ALBUMIN: 3.27
ALKALINE PHOSPHATASE: 129 U/L
ALT (SGPT): 34 U/L
ANION GAP: 15
BILIRUBIN TOTAL: 0.8 mg/dL
BLOOD UREA NITROGEN: 24 mg/dL
CALCIUM: 9.1 mg/dL
CHLORIDE: 106 mmol/L
CO2: 23.3 mmol/L
EGFR MDRD AF AMER: 60 mL/min/{1.73_m2}
EGFR MDRD NON AF AMER: 60 mL/min/{1.73_m2}
GLUCOSE RANDOM: 109 mg/dL
POTASSIUM: 4.3 mmol/L
PROTEIN TOTAL: 6 g/dL
SODIUM: 139 mmol/L

## 2017-12-10 LAB — NEUTROPHILS RELATIVE PERCENT: Lab: 7.4

## 2017-12-10 LAB — GLUCOSE RANDOM: Lab: 109

## 2017-12-10 LAB — PATHOLOGIST SMEAR INTERPRETATION

## 2017-12-10 NOTE — Unmapped (Signed)
You were seen today in follow-up for management of your AML.  As you were told previously you still have residual disease in the marrow.  Your strength is certainly improved since I last saw you and I agree with plans to move ahead with the next cycle of azacitidine.  We will also arrange for you to receive packed red blood cells today as it is undoubtedly helped your energy level.  We will need to check your blood work at least twice a week and I will see you at least in weekly for follow-up.  I am also pleased that she will be heading home from the rehab facility because you are gaining strength.

## 2017-12-10 NOTE — Unmapped (Signed)
Patient in for blood transfusion of one unit.  Patient arrived ambulatory with assistance of staff member. IV in place with positive blood return. Blood given per protocol and patient tolerated without difficulty.  Patient discharged via wheelchair with spouse. Patient and wife aware of return appointments and calendar given

## 2017-12-10 NOTE — Unmapped (Signed)
It was great to see you today.     I would recommend treating with Azacitidine and Venetoclax for at least another 2 months to assess whether the chemotherapy is working. I will talk with the oncology staff at [facility] to start treatment with Azacitidine next week for 7 days. Please continue Venetoclax 100 mg daily.     Please get your labs checked 2x/week at [facility].     We will give you 1 unit of blood today and will get you 1 unit of blood tomorrow in [place].     We will see you back in clinic in 1 month.     If you have questions or concerns at night or on the weekend, call the hospital operator at [phone] and ask to speak to the hematology/oncology fellow on call.  ??  If you have questions or concerns on a week day during business hours, you may call the Nurse call line at [phone].    Lab on 12/09/2017   Component Date Value Ref Range Status   ??? Sodium 12/09/2017 139  135 - 145 mmol/L Final   ??? Potassium 12/09/2017 4.4  3.5 - 5.0 mmol/L Final   ??? Chloride 12/09/2017 108* 98 - 107 mmol/L Final   ??? CO2 12/09/2017 23.0  22.0 - 30.0 mmol/L Final   ??? BUN 12/09/2017 24* 7 - 21 mg/dL Final   ??? Creatinine 12/09/2017 0.95  0.70 - 1.30 mg/dL Final   ??? BUN/Creatinine Ratio 12/09/2017 25   Final   ??? EGFR MDRD Non Af Amer 12/09/2017 >=60  >=60 mL/min/1.67m2 Final   ??? EGFR MDRD Af Amer 12/09/2017 >=60  >=60 mL/min/1.52m2 Final   ??? Anion Gap 12/09/2017 8* 9 - 15 mmol/L Final   ??? Glucose 12/09/2017 102  65 - 179 mg/dL Final   ??? Calcium 63/87/5643 9.3  8.5 - 10.2 mg/dL Final   ??? Albumin 32/95/1884 3.0* 3.5 - 5.0 g/dL Final   ??? Total Protein 12/09/2017 5.4* 6.5 - 8.3 g/dL Final   ??? Total Bilirubin 12/09/2017 0.8  0.0 - 1.2 mg/dL Final   ??? AST 16/60/6301 25  19 - 55 U/L Final   ??? ALT 12/09/2017 50  19 - 72 U/L Final   ??? Alkaline Phosphatase 12/09/2017 104  38 - 126 U/L Final   ??? WBC 12/09/2017 0.7* 4.5 - 11.0 10*9/L Preliminary   ??? RBC 12/09/2017 2.36* 4.50 - 5.90 10*12/L Preliminary   ??? HGB 12/09/2017 7.1* 13.5 - 17.5 g/dL Preliminary   ??? HCT 12/09/2017 20.7* 41.0 - 53.0 % Preliminary   ??? MCV 12/09/2017 87.8  80.0 - 100.0 fL Preliminary   ??? MCH 12/09/2017 30.3  26.0 - 34.0 pg Preliminary   ??? MCHC 12/09/2017 34.5  31.0 - 37.0 g/dL Preliminary   ??? RDW 12/09/2017 14.1  12.0 - 15.0 % Preliminary   ??? MPV 12/09/2017 12.2* 7.0 - 10.0 fL Preliminary   ??? Platelet 12/09/2017 22* 150 - 440 10*9/L Preliminary    Questionable result confirmed. Specimen integrity/identity confirmed.

## 2017-12-10 NOTE — Unmapped (Signed)
Patient arrived to chair 47.  No complaints noted.  Access of Rt AC PIV inserted and intact with blood return.     Patient was given 1 unit platelets and 1 unit PRBC.  Patient has appointment tomorrow at 0830 at his [facility] clinic in [place] and will received 2nd unit of PRBC.    Patient completed and tolerated treatment.  AVS given and patient discharged to home.

## 2017-12-10 NOTE — Unmapped (Signed)
If you feel like this is an emergency please call 911.  For appointments or questions Monday through Friday 8AM-5PM please call [phone] or Toll Free [phone]. For Medical questions or concerns ask for the Nurse Triage Line.  On Nights, Weekends, and Holidays call [phone] and ask for the Oncologist on Call.  Reasons to call the Nurse Triage Line:  Fever of 100.5 or greater  Nausea and/or vomiting not relived with nausea medicine  Diarrhea or constipation  Severe pain not relieved with usual pain regimen  Shortness of breath  Uncontrolled bleeding  Mental status changes

## 2017-12-10 NOTE — Unmapped (Signed)
??  [facility], Cancer Center, [facility]   Hematology Oncology Return Visit   DATE OF SERVICE  12/10/2017     REFERRING PROVIDER   Richardean Chimera, Md  [address] 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    CANCER HISTORY  Oncology History    Diagnosis: AML    Presentation: Pancytopenia    Bone marrow biopsy: 09/14/2017  Hypercellular (>95%) marrow with approximately 40% blasts  Dysmegakaryopoiesis  Aspirate- predominance of blasts- no manual aspirate differential given    Flow- 39% blasts- CD34+ (subset), CD13, CD15, CD7, CD11c ,CD33, CD117, HLA-DR    Cytogenetics: Normal    Molecular Mutations: Not done    Treatment:  Azacitidine 75 mg/m2 SQ days 1-7  Cycle 1: 11/07    C/b multiple infections and hospitalizations.    Myeloid Panel from [facility]:  BCOR: c.2124delC (p.Gly709AlafsTer)  FLT3: c.2505T>G (p.Asp835Glu) [FLT3-TKD]  NPM1: c.860_863dupTCTG (p.Trp288CysfsTer12) [Type A Mutation]  WT1: c.1144delG (p.Ala382HisfsTer67)    Cycle 2: Azacitidine  - 11/12/2017    Venetoclax initiated on 11/19/2017      Final Diagnosis   Date Value Ref Range Status   12/03/2017   Final    Bone marrow, right iliac, aspiration and biopsy  -  Hypercellular bone marrow (80%) with persistent involvement by acute myeloid leukemia (32% blasts by manual aspirate differentiation).  -  Cytogenetic studies are pending.  -  Flow cytometric MRD analysis is pending.  -  NPM1 quantitation study is pending.       Flow MRD = Undetectable        Acute myeloid leukemia (AML), M5 (CMS-HCC)    09/17/2017 Initial Diagnosis     Acute myeloid leukemia (AML), M5 (CMS-HCC)          CANCER STAGING  Cancer Staging  No matching staging information was found for the patient.    CURRENT HISTORY    Patient underwent bone marrow bx and aspirate performed at [facility] on 12/03/2017.   Pathology from this revealed a hypercellular bone marrow (80%) with persistent involvement by acute myeloid leukemia (32% blasts by manual aspirate differentiation).  Cytogenetic studies, flow cytometric MRD analysis and NPM1 quantitation study are pending.     The patient was seen at [facility] on 12/09/17 by Dr. Artemio Aly in follow up and it was recommended that Azacitadine be continued despite the cytopenias.  He received a 1 of 2 planned units of PRBC's and a unit of PLT's yesterday due to time constraints.  Feels better since receiving PRBC's.  He will be at the [facility] facility until tomorrow then will be discharged home.      He remains off Venetoclax 100 mg po daily.  He no longer feels swimmy headed and hearing is better.  Was seen at [facility] prior to last visit here and was placed on Amoxicillin for ear infection   He is more active than a week ago in that he is spending much less time in bed.       ASSESSMENT    72 year old male with history of coronary  artery disease, MI, GERD, colon polyps, tobacco dependence and overall poor performance status.    Patient has AML with normal karyotype however myeloid panel from peripheral blood revealed mutations in NPM1, FLT3-D835, BCOR, and WT1.  Patient has been treated with a cycle of cytarabine/ venetoclax followed by venetoclax.  Course has also been complicated by persistent cytopenias.    RECOMMENDATION/PLAN    AML with with normal karyotype but with mutations in NPM1, FLT3-D835, BCOR, and WT1.  Course of therapy has been comp located by persistent cytopenias.  Bone marrow biopsy obtained on December 03, 2017 shows a considerable amount of residual disease.  The clinical picture and that the patient is more robust in this visit suggest that he is benefiting from palliative chemotherapy as resolved at this we will proceed with the next cycle of chemotherapy in the form of single agent ACEs cytidine to commands the week of December 13, 2017.    Possible otitis: Appears to be improving on Augmentin 875 mg po bid x 14 days.      Transfusion support:  Transfuse 2 units of packed red blood cells for hemoglobin less than 8 and 1 unit of PLT's if  PLT count is less than 10 and/or actively bleeding.  Will check laboratories twice weekly at least so that he may be adequately transfused    Infection prophylaxis: Currently on Levaquin, posaconazole, and acyclovir for prophylaxis against bacterial, fungal and HSV infections respectively    Goals of care: Patient and his wife understand that the goal of therapy is palliative.      Poor performance status: Likely multifactorial but the main culprit is likely his underlying malignancy.  Appears to be improving such that he is to be discharged home from rehab.       HISTORY OF PRESENT ILLNESS   Arthur Bates is a 72 y.o. 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  He had associated with this extreme fatigue, generalized weakness, malaise, and near nightly damp sweats.  ??  On September 14, 2017 a bone marrow aspiration and biopsy was performed at [facility]. 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 Correlation with cytogenetics and fluorescent in situ hybridization was requested.  Evaluation by [facility] revealed  a (46, XY) karyotype.  DNA probe specific molecular analysis failed to reveal any cytogenetic abnormality on chromosome 8 to rule out the presence of trisomy 8.  Unfortunately further molecular profiling was not performed although requested by Dr. Donnie Coffin.  Molecular studies for certain abnormalities, such as mutations in FLT3, nucleophosmin (NPM1), KIT, CEBPA, or RUNX1 as well as gene expression profiles confer prognostic significance in adults with AML. AML with mutations of NPM1, RUNX1, biallelic CEBPA, PML-RARA, and others are included as specific entities in the World Health Organization classification.    ??  On September 22, 2017 he was started on cycle 1 of single agent azacitidine once daily for 7 days out of 28.  With the exception of hematologic toxicity in the form of cytopenias, overall tolerance had been acceptable.  ??  On October 04, 2017  Patient was admitted to [facility] for evaluation and management of extreme fatigue.  While hospitalized he received IVF, infectious workup and evaluation; empiric antimicrobial therapy pending the results of pancultures, 2 view chest x-ray, EKG and echocardiogram, troponin levels, and supportive care.  After 2 hospital days, he was discharged home.     ??On October 24, 2017 he was admitted with high fever (102-103) to the service of Dr. Donzetta Sprung, his primary care physician.  In addition to anemia and thrombocytopenia, he felt weak and tired.  Pancultures were obtained and he was started on empiric antibiotic therapy with vancomycin and Zosyn.  Several will sets of blood cultures were negative.  He was transfused 2 units of packed red blood cells.  At the time of his discharge on December 14, he was afebrile.  While hospitalized, his white blood count had risen to 25-30K with blasts in his peripheral blood.  His fever was felt to be due to his underlying leukemic process.  ??  Cycle 2 of azacitidine was scheduled to begin on November 01 2017.  That treatment was deferred due to hospitalization and fever workup and empiric antibiotics which failed to reveal any evidence of infection.    ??  Because of his deteriorating laboratory and clinical condition, he was admitted to [facility] on November 11, 2017.  Under the direction of Dr. Cristal Deer Dittus, he was started on hydroxyurea, then transitioned to azacitidine and venetoclax beginning November 11, 2017.  Azacitidine was completed on November 18, 2017. This represented Cycle 2 of chemotherapy.  Though treated with cefepime from December 31 to January 4, then transition to levofloxacin at the time of discharge, his cultures were persistently negative.CT imaging of the chest without intravenous contrast was performed on December 31 at [facility] revealed bibasilar subpleural opacities consistent with fibrosis.  No discrete infiltrate or consolidation was identified. His anorexia, and nutritional status and functional status were poor necessitating admission to [facility].  .    ??  ??PAST MEDICAL HISTORY  Past Medical History:   Diagnosis Date   ??? Coronary artery disease    ??? Depression    ??? Dissecting aortic aneurysm (any part), abdominal (CMS-HCC)    ??? ED (erectile dysfunction)    ??? GERD (gastroesophageal reflux disease)    ??? HTN (hypertension)    ??? Hyperlipidemia    ??? Ischemic heart disease    ??? Osteoarthritis of knee        SURGICAL HISTORY  Past Surgical History:   Procedure Laterality Date   ??? CARDIAC CATHETERIZATION     ??? COLONOSCOPY  2009   ??? CORONARY ANGIOPLASTY WITH STENT PLACEMENT     ??? KNEE SURGERY     ??? TOTAL KNEE ARTHROPLASTY         ALLERGIES:  Allergies   Allergen Reactions   ??? Ace Inhibitors Cough   ??? Shellfish Containing Products Nausea And Vomiting   ??? Zithromax [Azithromycin] Rash       MEDICATIONS:    Current Outpatient Prescriptions:   ???  acyclovir (ZOVIRAX) 400 MG tablet, Take 400 mg by mouth Two (2) times a day., Disp: , Rfl:   ???  allopurinol (ZYLOPRIM) 300 MG tablet, Take 1 tablet (300 mg total) by mouth daily., Disp: 30 tablet, Rfl: 0  ???  amoxicillin-clavulanate (AUGMENTIN) 875-125 mg per tablet, Take 1 tablet by mouth Two (2) times a day. for 14 days, Disp: 28 tablet, Rfl: 0  ???  famotidine (PEPCID) 20  MG tablet, Take 1 tablet (20 mg total) by mouth Two (2) times a day., Disp: 60 tablet, Rfl: 0  ???  levoFLOXacin (LEVAQUIN) 500 MG tablet, Take 500 mg by mouth daily., Disp: , Rfl:   ???  mirtazapine (REMERON) 15 MG tablet, Take 1 tablet (15 mg total) by mouth nightly., Disp: 90 tablet, Rfl: 3  ???  ondansetron (ZOFRAN) 8 MG tablet, Take 1 tablet (8 mg total) by mouth every twelve (12) hours as needed for nausea., Disp: 20 tablet, Rfl: 0  ???  polyethylene glycol (MIRALAX) 17 gram packet, MIX 1 PACKET OF POWDER (17 GM) IN 8 OZ OF JUICE OR WATER AND DRINK BY MOUTH DAILY, Disp: , Rfl: 6  ???  posaconazole (NOXAFIL) 100 mg TbEC delayed released tablet, Take 300 mg by mouth daily., Disp: 90 tablet, Rfl: 2  ???  senna (SENOKOT) 8.6 mg tablet, Take 2 tablets by mouth Two (2) times a day. Hold for diarrhea., Disp: 120 tablet, Rfl: 0  ???  venetoclax (VENCLEXTA) 100 mg tablet, Take 1 tablet (100 mg total) by mouth daily with evening meal. Take with a meal and water. Do not chew, crush, or break tablets., Disp: 120 tablet, Rfl: 6  No current facility-administered medications for this visit.        REVIEW OF SYSTEMS  Constitutional: No fever, sweats, or shaking chills. Has gained a few lbs since last visit,  ECOG Status is 3.   HEENT: No visual changes.  Ears feel clogged.  No changes in voice.  No mouth sores.     Pulmonary: No unusual cough, sore throat, or orthopnea. SOB on standing up.    Cardiovascular: Has history of coronary artery disease and MI.  No angina, or palpitations.     Gastrointestinal: Has nausea but no vomiting, dysphagia, odynophagia, abdominal pain, diarrhea, or constipation. No change in bowel habits.   Genitourinary: No frequency, urgency, hematuria, or dysuria.   Musculoskeletal: No arthralgias or myalgias; no back pain;  no joint swelling, pain or instability.   Hematologic: No bleeding tendency or easy bruisability.   Endocrine: Intolerance to cold; no thyroid disease or diabetes mellitus.   Skin: No rash, scaling, sores, lumps, or jaundice.  Vascular: No peripheral arterial or venous thromboembolic disease.   Psychological: No anxiety, depression, or mood changes; no mental health illnesses.   Neurological: Has dizziness, lightheadedness.  No syncope, or near syncopal episodes; no numbness or tingling in the fingers or toes.     PHYSICAL EXAMINATION  BP 125/60  - Pulse 72  - Temp 36.8 ??C (98.2 ??F) (Oral)  - Resp 16  - SpO2 100%      General:   Comfortable and more robust than last visit.  Here with wife.     Eyes:   Pupil equal round reacting to light and accomodation.  Extra occular muscles intact, and sclera clear and without icteris.   ENT:   Oropharynx without mucositis, or thrush.  Mucosa dry.  Right TM occluded with cerumen.  Left TM with good light reflex.    Neck:   Supple without any enlargement, no thyromegaly, bruit, or jugular venous distention.   Lymph Nodes:  No adenopathy (cervical, supraclavicular, axillary, inguinal)   Cardiovascular:  RRR, normal S1, S2 without murmur, rub, or gallop.  Pulses 2+ equal on both sides without any bruits.   Lungs:  Clear to auscultation bilaterally, without wheezes/crackles/rhonchi.  Good air movement.   Skin:    No rashes, jaundice, breakdown   Psychiatry:  Alert and oriented to person, place, and time    Abdomen:   Normoactive bowel sounds, abdomen soft, non-tender and not distended, no Hepatosplenomegaly or masses.  Liver normal in size, no rebound or guarding.    Extremities:   No bilateral cyanosis, clubbing or edema.  No rash, lesions, or petechiae.   Musculo Skeletal:   No joint tenderness, deformity, effusions.  No spine or costovertebral angle tenderness.  Full range of motion in shoulder, elbow, hip, knee, ankle, hands and feet. Decreased muscle mass.   Neurological:  Alert and oriented to person, place and time.  Cranial nerves II-XII grossly intact, unsteady gait, normal sensation throughout, normal cerebellar function.           LABORATORY STUDIES  Hospital Outpatient Visit on 12/09/2017   Component Date Value Ref Range Status   ??? Platelet 12/09/2017 32* 150 - 440 10*9/L Final   ??? Unit Blood Type 12/09/2017 A Pos   Final   ??? ISBT Number 12/09/2017 6200   Final   ??? Unit # 12/09/2017 [id number]   Final   ??? Status 12/09/2017 Issued   Final   ??? Product ID 12/09/2017 Platelets   Final   ??? PRODUCT CODE 12/09/2017 [id number]   Final   ??? Crossmatch 12/10/2017 Compatible   Final   ??? Unit Blood Type 12/10/2017 B Pos   Final   ??? ISBT Number 12/10/2017 7300   Final   ??? Unit # 12/10/2017 [id number]   Final   ??? Status 12/10/2017 Issued   Final   ??? Spec Expiration 12/10/2017 [id number]   Final   ??? Product ID 12/10/2017 Red Blood Cells   Final   ??? PRODUCT CODE 12/10/2017 [id number]   Final   ??? Crossmatch 12/10/2017 Compatible   Final   ??? Unit Blood Type 12/10/2017 B Pos   Final   ??? ISBT Number 12/10/2017 7300   Final   ??? Unit # 12/10/2017 [id number]   Final   ??? Status 12/10/2017 Work In Progress   Final   ??? Spec Expiration 12/10/2017 [id number]   Final   ??? Product ID 12/10/2017 Red Blood Cells   Final   ??? PRODUCT CODE 12/10/2017 [id number]   Final   Office Visit on 12/09/2017   Component Date Value Ref Range Status   ??? Antibody Screen 12/09/2017 NEG   Final   ??? ABO Grouping 12/09/2017 Invalid   Final   ??? Blood Type 12/09/2017 B POS   Final   Lab on 12/09/2017   Component Date Value Ref Range Status   ??? Sodium 12/09/2017 139  135 - 145 mmol/L Final   ??? Potassium 12/09/2017 4.4  3.5 - 5.0 mmol/L Final   ??? Chloride 12/09/2017 108* 98 - 107 mmol/L Final   ??? CO2 12/09/2017 23.0  22.0 - 30.0 mmol/L Final   ??? BUN 12/09/2017 24* 7 - 21 mg/dL Final   ??? Creatinine 12/09/2017 0.95  0.70 - 1.30 mg/dL Final   ??? BUN/Creatinine Ratio 12/09/2017 25   Final ??? EGFR MDRD Non Af Amer 12/09/2017 >=60  >=60 mL/min/1.84m2 Final   ??? EGFR MDRD Af Amer 12/09/2017 >=60  >=60 mL/min/1.20m2 Final   ??? Anion Gap 12/09/2017 8* 9 - 15 mmol/L Final   ??? Glucose 12/09/2017 102  65 - 179 mg/dL Final   ??? Calcium 43/32/9518 9.3  8.5 - 10.2 mg/dL Final   ??? Albumin 84/16/6063 3.0* 3.5 - 5.0 g/dL Final   ??? Total Protein 12/09/2017 5.4* 6.5 - 8.3 g/dL  Final   ??? Total Bilirubin 12/09/2017 0.8  0.0 - 1.2 mg/dL Final   ??? AST 29/52/8413 25  19 - 55 U/L Final   ??? ALT 12/09/2017 50  19 - 72 U/L Final   ??? Alkaline Phosphatase 12/09/2017 104  38 - 126 U/L Final   ??? WBC 12/09/2017 0.7* 4.5 - 11.0 10*9/L Preliminary   ??? RBC 12/09/2017 2.36* 4.50 - 5.90 10*12/L Preliminary   ??? HGB 12/09/2017 7.1* 13.5 - 17.5 g/dL Preliminary   ??? HCT 12/09/2017 20.7* 41.0 - 53.0 % Preliminary   ??? MCV 12/09/2017 87.8  80.0 - 100.0 fL Preliminary   ??? MCH 12/09/2017 30.3  26.0 - 34.0 pg Preliminary   ??? MCHC 12/09/2017 34.5  31.0 - 37.0 g/dL Preliminary   ??? RDW 12/09/2017 14.1  12.0 - 15.0 % Preliminary   ??? MPV 12/09/2017 12.2* 7.0 - 10.0 fL Preliminary   ??? Platelet 12/09/2017 22* 150 - 440 10*9/L Preliminary    Questionable result confirmed. Specimen integrity/identity confirmed.    ??? Pathologist Smear Interpretation 12/09/2017 Confirmed by Hemepath Specialist/Senior Tech   Final   Infusion on 12/08/2017   Component Date Value Ref Range Status   ??? 1 Unit Crossmatch 12/08/2017 B POSTIVE   Final    PATIENT RECIEVED 1 UNIT O POSTIVE 12/08/17   Lab on 12/07/2017   Component Date Value Ref Range Status   ??? WBC 12/07/2017 0.6  10*9/L Final    LOW   ??? RBC 12/07/2017 2.34  10*12/L Final    LOW   ??? HGB 12/07/2017 6.8  g/dL Final    LOW   ??? HCT 12/07/2017 20.9  % Final    LOW   ??? MCV 12/07/2017 89.3  fL Final   ??? MCH 12/07/2017 29.1  pg Final   ??? MCHC 12/07/2017 32.5  g/dL Final   ??? RDW 24/40/1027 13.5  % Final   ??? MPV 12/07/2017 12.6  fL Final    HIGH   ??? Platelet 12/07/2017 34  10*9/L Final    LOW   ??? nRBC 12/07/2017 5  /100 WBCs Final   ??? Neutrophils % 12/07/2017 6.8  % Final   ??? Lymphocytes % 12/07/2017 55.9  % Final   ??? Monocytes % 12/07/2017 28.8  % Final   ??? Eosinophils % 12/07/2017 0.0  % Final   ??? Basophils % 12/07/2017 0.0  % Final   ??? Absolute Neutrophils 12/07/2017 0.1  10*9/L Final    LOW   ??? Absolute Lymphocytes 12/07/2017 0.3  10*9/L Final    LOW   ??? Absolute Monocytes 12/07/2017 0.2  10*9/L Final   ??? Absolute Eosinophils 12/07/2017 0.0  10*9/L Final   ??? Absolute Basophils 12/07/2017 0.0  10*9/L Final   ??? Microcytosis 12/07/2017 Marked* Not Present Corrected   ??? Anisocytosis 12/07/2017 Marked* Not Present Corrected   ??? Hypochromasia 12/07/2017 Moderate* Not Present Corrected   ??? SEGS MAN 12/07/2017 4   Final    LOW   ??? Lymphs 12/07/2017 70   Final    HIGH   ??? Monocytes Manual 12/07/2017 26   Final    HIGH   ??? Poikilocytosis 12/07/2017 Marked   Final   ??? PLT Morphology 12/07/2017 test check   Final    DEC   ??? Sodium 12/07/2017 137  mmol/L Final   ??? Potassium 12/07/2017 4.3  mmol/L Final   ??? Chloride 12/07/2017 105  mmol/L Final   ??? CO2 12/07/2017 22.2  mmol/L Final   ???  BUN 12/07/2017 22  mg/dL Final    HIGH   ??? Creatinine 12/07/2017 0.82  mg/dL Final   ??? Glucose 16/08/9603 101  mg/dL Final   ??? Calcium 54/07/8118 9.3  mg/dL Final   ??? Total Protein 12/07/2017 6.3  g/dL Final   ??? Total Bilirubin 12/07/2017 1.1  mg/dL Final    HIGH   ??? AST 12/07/2017 18  U/L Final   ??? ALT 12/07/2017 15  U/L Final   ??? Alkaline Phosphatase 12/07/2017 101  U/L Final    HIGH   ??? EGFR MDRD Non Af Amer 12/07/2017 60  mL/min/1.107m2 Final   ??? EGFR MDRD Af Amer 12/07/2017 60  mL/min/1.32m2 Final   ??? Albumin 12/07/2017 3.2   Final    LOW   ??? Anion Gap 12/07/2017 14   Final   Hospital Outpatient Visit on 12/03/2017   Component Date Value Ref Range Status   ??? Unit Blood Type 12/04/2017 O Pos   Final   ??? ISBT Number 12/04/2017 [id number]   Final   ??? Unit # 12/04/2017 [id number]   Final   ??? Status 12/04/2017 Transfused   Final   ??? Product ID 12/04/2017 Platelets Final   ??? PRODUCT CODE 12/04/2017 [id number]   Final   ??? Platelet 12/03/2017 50* 150 - 440 10*9/L Final   Hospital Outpatient Visit on 12/03/2017   Component Date Value Ref Range Status   ??? Cytogenetics Test, Other 12/03/2017 Collected   Final   ??? Case Report 12/03/2017    Final                    Value:Surgical Pathology Report                         Case: [id number]                                 Authorizing Provider:  Windell Norfolk Zeidner,   Collected:           12/03/2017 1315                                     MD                                                                           Ordering Location:     [facility]      Received:            12/03/2017 1335                                     [place]  Pathologist:           Berdine Dance, MD                                                    Specimens:   A) - Bone Marrow Right - Aspirate                                                                   B) - Bone Marrow Right - Biopsy                                                                     C) - Peripheral Blood                                                                     ??? Final Diagnosis 12/03/2017    Final                    Value:This result contains rich text formatting which cannot be displayed here.   ??? Clinical History 12/03/2017    Final                    Value:This result contains rich text formatting which cannot be displayed here.   ??? Gross Description 12/03/2017    Final                    Value:This result contains rich text formatting which cannot be displayed here.   ??? Microscopic Description 12/03/2017    Final                    Value:This result contains rich text formatting which cannot be displayed here.   ??? Disclaimer 12/03/2017    Final                    Value:This result contains rich text formatting which cannot be displayed here.   ??? Sendout Test Name 12/03/2017 SEE COMMENT Final    Minimal Residual Disease (Bone Marrow Aspirate) sent to [facility].   ??? Sendout Test Result 12/03/2017 SEE COMMENT   Final    Scanned report is available for ordering provider by clicking on hyperlink below or viewing the Referral Testing Report in the Media Tab. Patients should contact their provider for test results.       ??? Collection 12/03/2017 Collected   Final   Lab on 12/03/2017   Component Date Value Ref Range Status   ??? WBC 12/03/2017 0.8* 4.5 - 11.0 10*9/L Final   ??? RBC 12/03/2017 2.79* 4.50 - 5.90 10*12/L  Final   ??? HGB 12/03/2017 8.5* 13.5 - 17.5 g/dL Final   ??? HCT 16/08/9603 24.3* 41.0 - 53.0 % Final   ??? MCV 12/03/2017 86.8  80.0 - 100.0 fL Final   ??? MCH 12/03/2017 30.4  26.0 - 34.0 pg Final   ??? MCHC 12/03/2017 35.0  31.0 - 37.0 g/dL Final   ??? RDW 54/07/8118 14.1  12.0 - 15.0 % Final   ??? MPV 12/03/2017 11.8* 7.0 - 10.0 fL Final   ??? Platelet 12/03/2017 24* 150 - 440 10*9/L Final    Questionable result confirmed. Specimen integrity/identity confirmed.   ??? nRBC 12/03/2017 5* <=4 /100 WBCs Final   ??? Antibody Screen 12/03/2017 NEG   Final   ??? Blood Type 12/03/2017 B POS   Final   ??? Absolute Neutrophils 12/03/2017 0.0* 2.0 - 7.5 10*9/L Final   ??? Absolute Lymphocytes 12/03/2017 0.4* 1.5 - 5.0 10*9/L Final   ??? Absolute Monocytes 12/03/2017 0.4  0.2 - 0.8 10*9/L Final   ??? Absolute Eosinophils 12/03/2017 0.0  0.0 - 0.4 10*9/L Final   ??? Absolute Basophils 12/03/2017 0.0  0.0 - 0.1 10*9/L Final   ??? Smear Review Comments 12/03/2017 See Comment* Undefined Final    Irregularly contracted RBCs present.    Office Visit on 11/29/2017   Component Date Value Ref Range Status   ??? Sodium 11/29/2017 137  mmol/L Final   ??? Potassium 11/29/2017 3.8  mmol/L Final   ??? Chloride 11/29/2017 97  mmol/L Final    low   ??? CO2 11/29/2017 26.5  mmol/L Final   ??? BUN 11/29/2017 18  mg/dL Final   ??? Creatinine 11/29/2017 0.93  mg/dL Final   ??? Glucose 14/78/2956 119  mg/dL Final    high   ??? Calcium 11/29/2017 9.2  mg/dL Final   ??? Total Protein 11/29/2017 6.4  g/dL Final   ??? Total Bilirubin 11/29/2017 1.3  mg/dL Final    high   ??? AST 11/29/2017 24  U/L Final   ??? ALT 11/29/2017 18  U/L Final   ??? Alkaline Phosphatase 11/29/2017 146  U/L Final    high   ??? EGFR MDRD Non Af Amer 11/29/2017 60  mL/min/1.26m2 Final   ??? EGFR MDRD Af Amer 11/29/2017 60  mL/min/1.35m2 Final   ??? Albumin 11/29/2017 3.50   Final   ??? Anion Gap 11/29/2017 17   Final    high   ??? WBC 11/29/2017 0.6  10*9/L Final    low   ??? RBC 11/29/2017 2.28  10*12/L Final    low   ??? HGB 11/29/2017 6.7  g/dL Final    low   ??? HCT 11/29/2017 20.0  % Final    low   ??? MCV 11/29/2017 87.7  fL Final   ??? MCH 11/29/2017 29.4  pg Final   ??? MCHC 11/29/2017 33.5  g/dL Final   ??? RDW 21/30/8657 13.0  % Final   ??? MPV 11/29/2017 12.2  fL Final    high   ??? Platelet 11/29/2017 50  10*9/L Final    low   ??? nRBC 11/29/2017 5  /100 WBCs Final   ??? Neutrophils % 11/29/2017 6.2  % Final   ??? Lymphocytes % 11/29/2017 70.3  % Final   ??? Monocytes % 11/29/2017 20.3  % Final   ??? Eosinophils % 11/29/2017 1.6  % Final   ??? Basophils % 11/29/2017 0.0  % Final   ??? Absolute Neutrophils 11/29/2017 0.0  10*9/L Final  low   ??? Absolute Lymphocytes 11/29/2017 0.5  10*9/L Final    low   ??? Absolute Monocytes 11/29/2017 0.1  10*9/L Final   ??? Absolute Eosinophils 11/29/2017 0.0  10*9/L Final   ??? Absolute Basophils 11/29/2017 0.0  10*9/L Final   ??? Microcytosis 11/29/2017 Marked* Not Present Final   ??? Anisocytosis 11/29/2017 Marked* Not Present Final   ??? Hypochromasia 11/29/2017 Marked* Not Present Final   ??? Bands Manual 11/29/2017 2   Final   ??? SEGS MAN 11/29/2017 6   Final    low   ??? Lymphs 11/29/2017 87   Final    high   ??? Monocytes 11/29/2017 4   Final   ??? Eosinophils Manual 11/29/2017 1   Final   ??? RBC Morphology 11/29/2017 test check   Final    NPY9   ??? Magnesium 11/29/2017 1.9  mg/dL Final   ??? Phosphorus 11/29/2017 3.7  mg/dL Final   ??? ABO/Rh Typing 11/29/2017 B POSTIVE   Final   ??? Antibody Screen 11/29/2017 NEGATIVE   Final   ??? Antibody ID 11/29/2017 NOT DONE   Final   ??? 2 Unit Crossmatch 11/29/2017 2 UNITS   Final    PATIENT RECIEVED 2 UNITS O POSITVE 1ST UNIT ON 11/29/17 2ND UNIT 11/30/17   Lab on 11/26/2017   Component Date Value Ref Range Status   ??? WBC 11/26/2017 0.4  10*9/L Final    LOW   ??? RBC 11/26/2017 2.35  10*12/L Final    LOW   ??? HGB 11/26/2017 7.0  g/dL Final    LOW   ??? HCT 11/26/2017 20.5  % Final    LOW   ??? MCV 11/26/2017 87.2  fL Final   ??? MCH 11/26/2017 29.8  pg Final   ??? MCHC 11/26/2017 34.1  g/dL Final   ??? RDW 24/40/1027 12.9  % Final   ??? MPV 11/26/2017 11.9  fL Final    HIGH   ??? Platelet 11/26/2017 41  10*9/L Final    LOW   ??? nRBC 11/26/2017 5  /100 WBCs Final   ??? Neutrophils % 11/26/2017 10.6  % Final   ??? Lymphocytes % 11/26/2017 44.7  % Final   ??? Monocytes % 11/26/2017 42.1  % Final   ??? Eosinophils % 11/26/2017 0.0  % Final   ??? Basophils % 11/26/2017 0.0  % Final   ??? Absolute Neutrophils 11/26/2017 0.0  10*9/L Final    LOW   ??? Absolute Lymphocytes 11/26/2017 0.2  10*9/L Final    LOW   ??? Absolute Monocytes 11/26/2017 0.2  10*9/L Final   ??? Absolute Eosinophils 11/26/2017 0.0  10*9/L Final   ??? Absolute Basophils 11/26/2017 0.0  10*9/L Final   ??? Anisocytosis 11/26/2017 Marked* Not Present Corrected    OCC   ??? Hypochromasia 11/26/2017 Moderate* Not Present Corrected   ??? Bands Manual 11/26/2017 1   Final   ??? SEGS MAN 11/26/2017 4   Final    LOW   ??? Lymphs 11/26/2017 60   Final    HIGH   ??? Monocytes Manual 11/26/2017 33   Final    HIGH   ??? Metamyelocytes Manual 11/26/2017 1   Final   ??? Myelocytes 11/26/2017 1   Final   ??? Nucleated RBC 11/26/2017 4  /100 WBCs Final    HIGH   ??? PLT Morphology 11/26/2017 test check   Final    DECR   ??? Giant PLTs 11/26/2017 Occasional   Final   ???  Sodium 11/26/2017 136  mmol/L Final   ??? Potassium 11/26/2017 3.9  mmol/L Final   ??? Chloride 11/26/2017 97  mmol/L Final    LOW   ??? CO2 11/26/2017 28.0  mmol/L Final   ??? BUN 11/26/2017 23  mg/dL Final    HIGH   ??? Creatinine 11/26/2017 0.88  mg/dL Final   ??? Glucose 40/98/1191 124  mg/dL Final    HIGH   ??? Calcium 11/26/2017 9.1  mg/dL Final   ??? Total Protein 11/26/2017 6.2  g/dL Final   ??? Total Bilirubin 11/26/2017 1.2  mg/dL Final    HIGH   ??? AST 11/26/2017 22  U/L Final   ??? ALT 11/26/2017 18  U/L Final   ??? Alkaline Phosphatase 11/26/2017 144  U/L Final    HIGH   ??? EGFR MDRD Non Af Amer 11/26/2017 60  mL/min/1.55m2 Final   ??? EGFR MDRD Af Amer 11/26/2017 60  mL/min/1.40m2 Final   ??? Albumin 11/26/2017 3.30   Final    LOW   ??? Anion Gap 11/26/2017 15   Final   ??? Uric Acid 11/26/2017 2.5  mg/dL Final    LOW   ??? Phosphorus 11/26/2017 4.3  mg/dL Final   ??? Magnesium 47/82/9562 1.9  mg/dL Final   There may be more visits with results that are not included.        IMAGING STUDIES  No current imaging results    The total time spent discussing the previous history, imaging studies, laboratory studies, the role and rationale of AML therapy, and discussion was 60 minutes.  At least 50% of that time was spent in answering questions and counseling.    FOLLOW UP: AS DIRECTED       Ccc:

## 2017-12-13 ENCOUNTER — Encounter: Admit: 2017-12-13 | Discharge: 2017-12-14 | Payer: PRIVATE HEALTH INSURANCE

## 2017-12-13 DIAGNOSIS — C9300 Acute monoblastic/monocytic leukemia, not having achieved remission: Secondary | ICD-10-CM | POA: Diagnosis not present

## 2017-12-13 LAB — CBC W/ DIFFERENTIAL
BASOPHILS ABSOLUTE COUNT: 0 10*9/L
BASOPHILS RELATIVE PERCENT: 0 %
EOSINOPHILS RELATIVE PERCENT: 0 %
HEMATOCRIT: 26.9 %
HEMOGLOBIN: 8.8 g/dL
LYMPHOCYTES ABSOLUTE COUNT: 0.5 10*9/L
LYMPHOCYTES RELATIVE PERCENT: 79.4 %
MEAN CORPUSCULAR HEMOGLOBIN CONC: 32.7 g/dL
MEAN CORPUSCULAR VOLUME: 91.2 fL
MEAN PLATELET VOLUME: 12.9 fL
MONOCYTES ABSOLUTE COUNT: 0.1 10*9/L
MONOCYTES RELATIVE PERCENT: 17.5 %
NEUTROPHILS RELATIVE PERCENT: 3.1 %
NUCLEATED RED BLOOD CELLS: 0 /100{WBCs}
PLATELET COUNT: 20 10*9/L
RED BLOOD CELL COUNT: 2.95 10*12/L
RED CELL DISTRIBUTION WIDTH: 13.5 %
WHITE BLOOD CELL COUNT: 0.6 10*9/L

## 2017-12-13 LAB — MICROCYTES: Lab: 0

## 2017-12-14 DIAGNOSIS — H61329 Acquired stenosis of external ear canal secondary to inflammation and infection, unspecified ear: Secondary | ICD-10-CM | POA: Diagnosis not present

## 2017-12-14 DIAGNOSIS — I2510 Atherosclerotic heart disease of native coronary artery without angina pectoris: Secondary | ICD-10-CM | POA: Diagnosis not present

## 2017-12-14 DIAGNOSIS — D6189 Other specified aplastic anemias and other bone marrow failure syndromes: Secondary | ICD-10-CM | POA: Diagnosis not present

## 2017-12-14 DIAGNOSIS — C9210 Chronic myeloid leukemia, BCR/ABL-positive, not having achieved remission: Secondary | ICD-10-CM | POA: Diagnosis not present

## 2017-12-14 DIAGNOSIS — Z955 Presence of coronary angioplasty implant and graft: Secondary | ICD-10-CM | POA: Diagnosis not present

## 2017-12-14 DIAGNOSIS — R1310 Dysphagia, unspecified: Secondary | ICD-10-CM | POA: Diagnosis not present

## 2017-12-14 DIAGNOSIS — K219 Gastro-esophageal reflux disease without esophagitis: Secondary | ICD-10-CM | POA: Diagnosis not present

## 2017-12-14 DIAGNOSIS — I252 Old myocardial infarction: Secondary | ICD-10-CM | POA: Diagnosis not present

## 2017-12-14 DIAGNOSIS — I714 Abdominal aortic aneurysm, without rupture: Secondary | ICD-10-CM | POA: Diagnosis not present

## 2017-12-14 DIAGNOSIS — I10 Essential (primary) hypertension: Secondary | ICD-10-CM | POA: Diagnosis not present

## 2017-12-14 DIAGNOSIS — R627 Adult failure to thrive: Secondary | ICD-10-CM | POA: Diagnosis not present

## 2017-12-14 NOTE — Unmapped (Signed)
Addended by: Loni Muse on: 12/14/2017 10:52 AM     Modules accepted: Orders

## 2017-12-15 ENCOUNTER — Encounter: Admit: 2017-12-15 | Discharge: 2017-12-16 | Payer: PRIVATE HEALTH INSURANCE

## 2017-12-15 DIAGNOSIS — R0602 Shortness of breath: Secondary | ICD-10-CM | POA: Diagnosis not present

## 2017-12-15 DIAGNOSIS — C9300 Acute monoblastic/monocytic leukemia, not having achieved remission: Secondary | ICD-10-CM | POA: Diagnosis not present

## 2017-12-15 DIAGNOSIS — Z5111 Encounter for antineoplastic chemotherapy: Secondary | ICD-10-CM | POA: Diagnosis not present

## 2017-12-15 DIAGNOSIS — C9200 Acute myeloblastic leukemia, not having achieved remission: Secondary | ICD-10-CM | POA: Diagnosis not present

## 2017-12-15 DIAGNOSIS — D6189 Other specified aplastic anemias and other bone marrow failure syndromes: Secondary | ICD-10-CM | POA: Diagnosis not present

## 2017-12-15 LAB — B-TYPE NATRIURETIC PEPTIDE: Lab: 255.1

## 2017-12-15 LAB — CBC W/ DIFFERENTIAL
BASOPHILS ABSOLUTE COUNT: 0 10*9/L
BASOPHILS RELATIVE PERCENT: 0 %
EOSINOPHILS ABSOLUTE COUNT: 0 10*9/L
EOSINOPHILS RELATIVE PERCENT: 0 %
HEMATOCRIT: 25.4 %
HEMOGLOBIN: 8.1 g/dL
LYMPHOCYTES RELATIVE PERCENT: 86.8 %
LYMPHS: 88
MEAN CORPUSCULAR HEMOGLOBIN CONC: 31.9 g/dL
MEAN CORPUSCULAR HEMOGLOBIN: 29.2 pg
MEAN CORPUSCULAR VOLUME: 91.7 fL
MEAN PLATELET VOLUME: 12.8 fL
MONOCYTES - MAN (DIFF): 12
MONOCYTES ABSOLUTE COUNT: 0 10*9/L
MONOCYTES RELATIVE PERCENT: 10.5 %
NEUTROPHILS ABSOLUTE COUNT: 0 10*9/L
NEUTROPHILS RELATIVE PERCENT: 2.7 %
NUCLEATED RED BLOOD CELLS: 0 /100{WBCs}
PLATELET COUNT: 13 10*9/L
RED BLOOD CELL COUNT: 2.77 10*12/L
RED CELL DISTRIBUTION WIDTH: 13.2 %
WHITE BLOOD CELL COUNT: 0.4 10*9/L

## 2017-12-15 LAB — COMPREHENSIVE METABOLIC PANEL
ALBUMIN: 3.48
ALKALINE PHOSPHATASE: 113 U/L
ALT (SGPT): 24 U/L
ANION GAP: 14
AST (SGOT): 17 U/L
BILIRUBIN TOTAL: 0.6 mg/dL
CALCIUM: 9.3 mg/dL
CHLORIDE: 107 mmol/L
CO2: 20.8 mmol/L
CREATININE: 0.84 mg/dL
EGFR MDRD NON AF AMER: 60 mL/min/{1.73_m2}
GLUCOSE RANDOM: 108 mg/dL
POTASSIUM: 3.8 mmol/L
PROTEIN TOTAL: 6.1 g/dL
SODIUM: 138 mmol/L

## 2017-12-15 LAB — BILIRUBIN TOTAL: Lab: 0.6

## 2017-12-15 LAB — MEAN PLATELET VOLUME: Lab: 12.8

## 2017-12-15 NOTE — Unmapped (Signed)
Addended by: Loni Muse on: 12/15/2017 12:18 PM     Modules accepted: Orders

## 2017-12-15 NOTE — Unmapped (Signed)
Patient arrived to clinic ambulatory.  Weight and vital signs obtained.  Premedication given as ordered.  Chemotherapy given as ordered.  Patient tolerated well without complaint.  Patient d/c to home.  Patient aware of return appointment patient and has a calendar. Patient and wife aware to return Friday for blood products

## 2017-12-16 ENCOUNTER — Encounter: Admit: 2017-12-16 | Discharge: 2017-12-16 | Payer: PRIVATE HEALTH INSURANCE

## 2017-12-16 DIAGNOSIS — I10 Essential (primary) hypertension: Secondary | ICD-10-CM | POA: Diagnosis not present

## 2017-12-16 DIAGNOSIS — R110 Nausea: Secondary | ICD-10-CM | POA: Diagnosis not present

## 2017-12-16 DIAGNOSIS — Z5111 Encounter for antineoplastic chemotherapy: Secondary | ICD-10-CM | POA: Diagnosis not present

## 2017-12-16 DIAGNOSIS — J948 Other specified pleural conditions: Secondary | ICD-10-CM | POA: Diagnosis not present

## 2017-12-16 DIAGNOSIS — D6189 Other specified aplastic anemias and other bone marrow failure syndromes: Secondary | ICD-10-CM | POA: Diagnosis not present

## 2017-12-16 DIAGNOSIS — K635 Polyp of colon: Secondary | ICD-10-CM | POA: Diagnosis not present

## 2017-12-16 DIAGNOSIS — D61818 Other pancytopenia: Secondary | ICD-10-CM | POA: Diagnosis not present

## 2017-12-16 DIAGNOSIS — Z79899 Other long term (current) drug therapy: Secondary | ICD-10-CM | POA: Diagnosis not present

## 2017-12-16 DIAGNOSIS — Z91013 Allergy to seafood: Secondary | ICD-10-CM | POA: Diagnosis not present

## 2017-12-16 DIAGNOSIS — T451X5A Adverse effect of antineoplastic and immunosuppressive drugs, initial encounter: Secondary | ICD-10-CM | POA: Diagnosis not present

## 2017-12-16 DIAGNOSIS — I252 Old myocardial infarction: Secondary | ICD-10-CM | POA: Diagnosis not present

## 2017-12-16 DIAGNOSIS — H61329 Acquired stenosis of external ear canal secondary to inflammation and infection, unspecified ear: Secondary | ICD-10-CM | POA: Diagnosis not present

## 2017-12-16 DIAGNOSIS — K219 Gastro-esophageal reflux disease without esophagitis: Secondary | ICD-10-CM | POA: Diagnosis not present

## 2017-12-16 DIAGNOSIS — M19042 Primary osteoarthritis, left hand: Secondary | ICD-10-CM | POA: Diagnosis not present

## 2017-12-16 DIAGNOSIS — M19041 Primary osteoarthritis, right hand: Secondary | ICD-10-CM | POA: Diagnosis not present

## 2017-12-16 DIAGNOSIS — H9193 Unspecified hearing loss, bilateral: Secondary | ICD-10-CM | POA: Diagnosis not present

## 2017-12-16 DIAGNOSIS — N200 Calculus of kidney: Secondary | ICD-10-CM | POA: Diagnosis not present

## 2017-12-16 DIAGNOSIS — I2510 Atherosclerotic heart disease of native coronary artery without angina pectoris: Secondary | ICD-10-CM | POA: Diagnosis not present

## 2017-12-16 DIAGNOSIS — R627 Adult failure to thrive: Secondary | ICD-10-CM | POA: Diagnosis not present

## 2017-12-16 DIAGNOSIS — D61810 Antineoplastic chemotherapy induced pancytopenia: Secondary | ICD-10-CM | POA: Diagnosis not present

## 2017-12-16 DIAGNOSIS — R42 Dizziness and giddiness: Secondary | ICD-10-CM | POA: Diagnosis not present

## 2017-12-16 DIAGNOSIS — R0602 Shortness of breath: Secondary | ICD-10-CM | POA: Diagnosis not present

## 2017-12-16 DIAGNOSIS — Z881 Allergy status to other antibiotic agents status: Secondary | ICD-10-CM | POA: Diagnosis not present

## 2017-12-16 DIAGNOSIS — C9210 Chronic myeloid leukemia, BCR/ABL-positive, not having achieved remission: Secondary | ICD-10-CM | POA: Diagnosis not present

## 2017-12-16 DIAGNOSIS — Z955 Presence of coronary angioplasty implant and graft: Secondary | ICD-10-CM | POA: Diagnosis not present

## 2017-12-16 DIAGNOSIS — R1310 Dysphagia, unspecified: Secondary | ICD-10-CM | POA: Diagnosis not present

## 2017-12-16 DIAGNOSIS — C9200 Acute myeloblastic leukemia, not having achieved remission: Secondary | ICD-10-CM | POA: Diagnosis not present

## 2017-12-16 DIAGNOSIS — E785 Hyperlipidemia, unspecified: Secondary | ICD-10-CM | POA: Diagnosis not present

## 2017-12-16 DIAGNOSIS — I714 Abdominal aortic aneurysm, without rupture: Secondary | ICD-10-CM | POA: Diagnosis not present

## 2017-12-16 DIAGNOSIS — C9300 Acute monoblastic/monocytic leukemia, not having achieved remission: Secondary | ICD-10-CM

## 2017-12-16 NOTE — Unmapped (Signed)
??  [facility], [facility]   Hematology Oncology Return Visit   DATE OF SERVICE  12/16/2017     REFERRING PROVIDER   Richardean Chimera, Md  [address] [place], [place] [address]    PRIMARY CARE PROVIDER  Richardean Chimera, MD  [address] 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    CANCER HISTORY  Oncology History    Diagnosis: AML    Presentation: Pancytopenia    Bone marrow biopsy: 09/14/2017  Hypercellular (>95%) marrow with approximately 40% blasts  Dysmegakaryopoiesis  Aspirate- predominance of blasts- no manual aspirate differential given    Flow- 39% blasts- CD34+ (subset), CD13, CD15, CD7, CD11c ,CD33, CD117, HLA-DR    Cytogenetics: Normal    Molecular Mutations: Not done    Treatment:  Azacitidine 75 mg/m2 SQ days 1-7  Cycle 1: 11/07    C/b multiple infections and hospitalizations.    Myeloid Panel from [facility]:  BCOR: c.2124delC (p.Gly709AlafsTer)  FLT3: c.2505T>G (p.Asp835Glu) [FLT3-TKD]  NPM1: c.860_863dupTCTG (p.Trp288CysfsTer12) [Type A Mutation]  WT1: c.1144delG (p.Ala382HisfsTer67)    Cycle 2: Azacitidine  - 11/12/2017    Venetoclax initiated on 11/19/2017      Final Diagnosis   Date Value Ref Range Status   12/03/2017   Final    Bone marrow, right iliac, aspiration and biopsy  -  Hypercellular bone marrow (80%) with persistent involvement by acute myeloid leukemia (32% blasts by manual aspirate differentiation).  -  Cytogenetic studies are pending.  -  Flow cytometric MRD analysis is pending.  -  NPM1 quantitation study is pending.       Flow MRD = Undetectable        Acute myeloid leukemia (AML), M5 (CMS-HCC)    09/17/2017 Initial Diagnosis     Acute myeloid leukemia (AML), M5 (CMS-HCC)          CANCER STAGING  Cancer Staging  No matching staging information was found for the patient.    CURRENT HISTORY    Restarted Azacitadine 75 mg/m2 sq x 7 days on December 15 2017. Has DOE on standing up and going to the bathroom.  Denies chest pain, but has occasional cough without sputum production.  No lower extremity edema. Denies SOB at rest.  Sleeps in a recliner and has not been laying flat at home but did not have orthopnea while at the [facility] home.   RA O2 sat was 100% yesterday.   BNP on 12/15/17 was 255.1 pg/mL.      ASSESSMENT    72 year old male with history of coronary artery disease, MI, GERD, colon polyps, tobacco dependence and overall poor performance status.    Patient has AML with normal karyotype however myeloid panel from peripheral blood revealed mutations in NPM1, FLT3-D835, BCOR, and WT1.  Patient has been treated with a cycle of cytarabine/ venetoclax followed by venetoclax.  Course has also been complicated by persistent cytopenias.    RECOMMENDATION/PLAN    AML with with normal karyotype but with mutations in NPM1, FLT3-D835, BCOR, and WT1.  Course of therapy has been comp located by persistent cytopenias.  Bone marrow biopsy obtained on December 03, 2017 shows a considerable amount of residual disease.  The clinical picture and that the patient is more robust in this visit suggest that he is benefiting from palliative chemotherapy as resolved at this we will proceed with the next cycle of chemotherapy in the form of single agent ACEs cytidine to commands the week of December 13, 2017.    Possible otitis: Completed the course of Augmentin 875 mg po bid x 14 days on December 15 2017.        Transfusion support:  Transfuse 2 units of packed red blood cells for hemoglobin less than 8 and 1 unit of PLT's if  PLT count is less than 10 and/or actively bleeding.  Will check laboratories twice weekly at least so that he may be adequately transfused.  Will arrange for 1 unit of PRBC's and 1 unit PLT's to be given tomorrow. Infection prophylaxis: Currently on Levaquin, posaconazole, and acyclovir for prophylaxis against bacterial, fungal and HSV infections respectively    Goals of care: Patient and his wife understand that the goal of therapy is palliative.      Shortness of breath:  BNP on January 30th 2019 does not suggest a cardiac cause.  The normal RA O2 sat does not suggest a pulmonary cause.  Marland Kitchen      Poor performance status: Likely multifactorial but the main culprit is likely his underlying malignancy.  Appears to be improving now that he is discharged home from rehab.  Doing home PT and OT.         HISTORY OF PRESENT ILLNESS   Arthur Bates is a 72 y.o. 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  He had associated with this extreme fatigue, generalized weakness, malaise, and near nightly damp sweats.  ??  On September 14, 2017 a bone marrow aspiration and biopsy was performed at [facility] 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 Correlation with cytogenetics and fluorescent in situ hybridization was requested.  Evaluation by [facility] revealed  a (46, XY) karyotype.  DNA probe specific molecular analysis failed to reveal any cytogenetic abnormality on chromosome 8 to rule out the presence of trisomy 8.  Unfortunately further molecular profiling was not performed although requested by Dr. Donnie Coffin.  Molecular studies for certain abnormalities, such as mutations in FLT3, nucleophosmin (NPM1), KIT, CEBPA, or RUNX1 as well as gene expression profiles confer prognostic significance in adults with AML. AML with mutations of NPM1, RUNX1, biallelic CEBPA, PML-RARA, and others are included as specific entities in the World Health Organization classification.    ??  On September 22, 2017 he was started on cycle 1 of single agent azacitidine once daily for 7 days out of 28.  With the exception of hematologic toxicity in the form of cytopenias, overall tolerance had been acceptable.  ??  On October 04, 2017 [facility] and management of extreme fatigue.  While hospitalized he received IVF, infectious workup and evaluation; empiric antimicrobial therapy pending the results of pancultures, 2 view chest x-ray, EKG and echocardiogram, troponin levels, and supportive care.  After 2 hospital days, he was  discharged home.     ??On October 24, 2017 he was admitted with high fever (102-103) to the service of Dr. Donzetta Sprung, his primary care physician.  In addition to anemia and thrombocytopenia, he felt weak and tired.  Pancultures were obtained and he was started on empiric antibiotic therapy with vancomycin and Zosyn.  Several will sets of blood cultures were negative.  He was transfused 2 units of packed red blood cells.  At the time of his discharge on December 14, he was afebrile.  While hospitalized, his white blood count had risen to 25-30K with blasts in his peripheral blood.  His fever was felt to be due to his underlying leukemic process.  ??  Cycle 2 of azacitidine was scheduled to begin on November 01 2017.  That treatment was deferred due to hospitalization and fever workup and empiric antibiotics which failed to reveal any evidence of infection.    ??  Because of his deteriorating laboratory and clinical condition, he was admitted to [facility] on November 11, 2017.  Under the direction of Dr. Cristal Deer Dittus, he was started on hydroxyurea, then transitioned to azacitidine and venetoclax beginning November 11, 2017.  Azacitidine was completed on November 18, 2017. This represented Cycle 2 of chemotherapy.  Though treated with cefepime from December 31 to January 4, then transition to levofloxacin at the time of discharge, his cultures were persistently negative.CT imaging of the chest without intravenous contrast was performed on December 31 at [facility] revealed bibasilar subpleural opacities consistent with fibrosis.  No discrete infiltrate or consolidation was identified. His anorexia, and nutritional status and functional status were poor necessitating admission to [facility]      Patient underwent bone marrow bx and aspirate performed at [facility] on 12/03/2017.   Pathology from this revealed a hypercellular bone marrow (80%) with persistent involvement by acute myeloid leukemia (32% blasts by manual aspirate differentiation).  Cytogenetic studies, flow cytometric MRD analysis and NPM1 quantitation study are pending.     The patient was seen at [facility] on 12/09/17 by Dr. Artemio Aly in follow up and it was recommended that Azacitadine be continued despite the cytopenias.  He received a 1 of 2 planned units of PRBC's and a unit of PLT's yesterday on that day due to time constraints.  He receied a second unit of PRBC's the following day.  He was discharged from the [facility] facility to home on December 11 2017 .    ??  ??PAST MEDICAL HISTORY  Past Medical History:   Diagnosis Date   ??? Coronary artery disease    ??? Depression    ??? Dissecting aortic aneurysm (any part), abdominal (CMS-HCC)    ??? ED (erectile dysfunction)    ??? GERD (gastroesophageal reflux disease)    ??? HTN (hypertension)    ??? Hyperlipidemia    ??? Ischemic heart disease    ??? Osteoarthritis of knee        SURGICAL HISTORY  Past Surgical History:   Procedure Laterality Date   ??? CARDIAC CATHETERIZATION     ??? COLONOSCOPY  2009   ??? CORONARY ANGIOPLASTY WITH STENT PLACEMENT ??? KNEE SURGERY     ??? TOTAL KNEE ARTHROPLASTY         ALLERGIES:  Allergies   Allergen Reactions   ??? Ace Inhibitors Cough   ??? Shellfish Containing Products Nausea And Vomiting   ??? Zithromax [Azithromycin] Rash       MEDICATIONS:  Current Outpatient Prescriptions:   ???  acyclovir (ZOVIRAX) 400 MG tablet, Take 400 mg by mouth Two (2) times a day., Disp: , Rfl:   ???  allopurinol (ZYLOPRIM) 300 MG tablet, Take 1 tablet (300 mg total) by mouth daily., Disp: 30 tablet, Rfl: 0  ???  famotidine (PEPCID) 20 MG tablet, Take 1 tablet (20 mg total) by mouth Two (2) times a day., Disp: 60 tablet, Rfl: 0  ???  levoFLOXacin (LEVAQUIN) 500 MG tablet, Take 500 mg by mouth daily., Disp: , Rfl:   ???  megestrol (MEGACE) 400 mg/10 mL (40 mg/mL) suspension, TAKE BY MOUTH TWICE DAILY, Disp: , Rfl: 0  ???  mirtazapine (REMERON) 15 MG tablet, Take 1 tablet (15 mg total) by mouth nightly., Disp: 90 tablet, Rfl: 3  ???  ondansetron (ZOFRAN) 8 MG tablet, Take 1 tablet (8 mg total) by mouth every twelve (12) hours as needed for nausea., Disp: 20 tablet, Rfl: 0  ???  polyethylene glycol (MIRALAX) 17 gram packet, MIX 1 PACKET OF POWDER (17 GM) IN 8 OZ OF JUICE OR WATER AND DRINK BY MOUTH DAILY, Disp: , Rfl: 6  ???  senna (SENOKOT) 8.6 mg tablet, Take 2 tablets by mouth Two (2) times a day. Hold for diarrhea., Disp: 120 tablet, Rfl: 0  ???  venetoclax (VENCLEXTA) 100 mg tablet, Take 1 tablet (100 mg total) by mouth daily with evening meal. Take with a meal and water. Do not chew, crush, or break tablets., Disp: 120 tablet, Rfl: 6  ???  amoxicillin-clavulanate (AUGMENTIN) 875-125 mg per tablet, Take 1 tablet by mouth Two (2) times a day. for 14 days (Patient not taking: Reported on 12/16/2017), Disp: 28 tablet, Rfl: 0  ???  posaconazole (NOXAFIL) 100 mg TbEC delayed released tablet, Take 300 mg by mouth daily., Disp: 90 tablet, Rfl: 2  No current facility-administered medications for this visit.     Facility-Administered Medications Ordered in Other Visits:   ??? azaCITIDine (VIDAZA) syringe 136.5 mg, 75 mg/m2 (Treatment Plan Recorded), Subcutaneous, Once, Trixie Dredge, MD  ???  [START ON 12/17/2017] azaCITIDine (VIDAZA) syringe 136.5 mg, 75 mg/m2 (Treatment Plan Recorded), Subcutaneous, Once, Trixie Dredge, MD  ???  OKAY TO SEND MEDICATION/CHEMOTHERAPY TO UNIT, , Other, Once, Trixie Dredge, MD  ???  OKAY TO SEND MEDICATION/CHEMOTHERAPY TO UNIT, , Other, Once, Trixie Dredge, MD  ???  [START ON 12/17/2017] ondansetron (ZOFRAN) tablet 16 mg, 16 mg, Oral, Once, Trixie Dredge, MD       REVIEW OF SYSTEMS  Constitutional: No fever, sweats, or shaking chills. Has gained a few lbs since last visit,  ECOG Status is 2.   HEENT: No visual changes.  Ears feel clogged.  No changes in voice.  No mouth sores.     Pulmonary: No unusual cough, sore throat, or orthopnea. SOB on standing up.    Cardiovascular: Has history of coronary artery disease and MI.  No angina, or palpitations.     Gastrointestinal: Has nausea but no vomiting, dysphagia, odynophagia, abdominal pain, diarrhea, or constipation. No change in bowel habits.   Genitourinary: No frequency, urgency, hematuria, or dysuria.   Musculoskeletal: No arthralgias or myalgias; no back pain;  no joint swelling, pain or instability.   Hematologic: No bleeding tendency or easy bruisability.   Endocrine: Intolerance to cold; no thyroid disease or diabetes mellitus.   Skin: No rash, scaling, sores, lumps, or jaundice.  Vascular: No peripheral arterial or venous thromboembolic disease.   Psychological: No anxiety,  depression, or mood changes; no mental health illnesses.   Neurological: Has dizziness, lightheadedness.  No syncope, or near syncopal episodes; no numbness or tingling in the fingers or toes.     PHYSICAL EXAMINATION  There were no vitals taken for this visit.     General:   Comfortable and more robust than last visit.  Here with wife.     Eyes:   Pupil equal round reacting to light and accomodation.  Extra occular muscles intact, and sclera clear and without icteris.   ENT:   Oropharynx without mucositis, or thrush.  Mucosa dry.  Right TM occluded with cerumen.  Left TM with good light reflex.    Neck:   Supple without any enlargement, no thyromegaly, bruit, or jugular venous distention.   Lymph Nodes:  No adenopathy (cervical, supraclavicular, axillary, inguinal)   Cardiovascular:  RRR, normal S1, S2 without murmur, rub, or gallop.  Pulses 2+ equal on both sides without any bruits.   Lungs:  Clear to auscultation bilaterally, without wheezes/crackles/rhonchi.  Good air movement.   Skin:    No rashes, jaundice, breakdown   Psychiatry:   Alert and oriented to person, place, and time    Abdomen:   Normoactive bowel sounds, abdomen soft, non-tender and not distended, no Hepatosplenomegaly or masses.  Liver normal in size, no rebound or guarding.    Extremities:   No bilateral cyanosis, clubbing or edema.  No rash, lesions, or petechiae.   Musculo Skeletal:   No joint tenderness, deformity, effusions.  No spine or costovertebral angle tenderness.  Full range of motion in shoulder, elbow, hip, knee, ankle, hands and feet.  Decreased muscle mass.   Neurological:  Alert and oriented to person, place and time.  Cranial nerves II-XII grossly intact, unsteady gait, normal sensation throughout, normal cerebellar function.           LABORATORY STUDIES  Lab on 12/15/2017   Component Date Value Ref Range Status   ??? WBC 12/15/2017 0.4  10*9/L Final    LOW   ??? RBC 12/15/2017 2.77  10*12/L Final    LOW   ??? HGB 12/15/2017 8.1  g/dL Final    LOW   ??? HCT 12/15/2017 25.4  % Final    LOW   ??? MCV 12/15/2017 91.7  fL Final   ??? MCH 12/15/2017 29.2  pg Final   ??? MCHC 12/15/2017 31.9  g/dL Final    LOW   ??? RDW 12/15/2017 13.2  % Final   ??? MPV 12/15/2017 12.8  fL Final    HIGH   ??? Platelet 12/15/2017 13  10*9/L Final    LOW   ??? nRBC 12/15/2017 0  /100 WBCs Final   ??? Neutrophils % 12/15/2017 2.7  % Final   ??? Lymphocytes % 12/15/2017 86.8  % Final   ??? Monocytes % 12/15/2017 10.5  % Final   ??? Eosinophils % 12/15/2017 0.0  % Final   ??? Basophils % 12/15/2017 0.0  % Final   ??? Absolute Neutrophils 12/15/2017 0.0  10*9/L Final    LOW   ??? Absolute Lymphocytes 12/15/2017 0.3  10*9/L Final    LOW   ??? Absolute Monocytes 12/15/2017 0.0  10*9/L Final    LOW   ??? Absolute Eosinophils 12/15/2017 0.0  10*9/L Final   ??? Absolute Basophils 12/15/2017 0.0  10*9/L Final   ??? Hypochromasia 12/15/2017 Marked* Not Present Final   ??? Lymphs 12/15/2017 88   Final    HIGH   ???  Monocytes Manual 12/15/2017 12   Final   ??? PLT Morphology 12/15/2017 test check   Final    DEC   ??? BNP 12/15/2017 255.1  pg/mL Final   ??? Sodium 12/15/2017 138  mmol/L Final   ??? Potassium 12/15/2017 3.8  mmol/L Final   ??? Chloride 12/15/2017 107  mmol/L Final   ??? CO2 12/15/2017 20.8  mmol/L Final    LOW   ??? BUN 12/15/2017 27  mg/dL Final    HIGH   ??? Creatinine 12/15/2017 0.84  mg/dL Final   ??? Glucose 16/08/9603 108  mg/dL Final    HIGH   ??? Calcium 12/15/2017 9.3  mg/dL Final   ??? Total Protein 12/15/2017 6.1  g/dL Final   ??? Total Bilirubin 12/15/2017 0.6  mg/dL Final   ??? AST 54/07/8118 17  U/L Final   ??? ALT 12/15/2017 24  U/L Final   ??? Alkaline Phosphatase 12/15/2017 113  U/L Final    HIGH   ??? EGFR MDRD Non Af Amer 12/15/2017 60  mL/min/1.74m2 Final   ??? EGFR MDRD Af Amer 12/15/2017 60  mL/min/1.63m2 Final   ??? Albumin 12/15/2017 3.48   Final    LOW   ??? Anion Gap 12/15/2017 14   Final   Lab on 12/13/2017   Component Date Value Ref Range Status   ??? WBC 12/13/2017 0.6  10*9/L Final    LOW   ??? RBC 12/13/2017 2.95  10*12/L Final    LOW   ??? HGB 12/13/2017 8.8  g/dL Final    LOW   ??? HCT 12/13/2017 26.9  % Final    LOW   ??? MCV 12/13/2017 91.2  fL Final   ??? MCH 12/13/2017 29.8  pg Final   ??? MCHC 12/13/2017 32.7  g/dL Final   ??? RDW 14/78/2956 13.5  % Final   ??? MPV 12/13/2017 12.9  fL Final    HIGH   ??? Platelet 12/13/2017 20  10*9/L Final    LOW   ??? nRBC 12/13/2017 0  /100 WBCs Final   ??? Neutrophils % 12/13/2017 3.1  % Final   ??? Lymphocytes % 12/13/2017 79.4  % Final   ??? Monocytes % 12/13/2017 17.5  % Final   ??? Eosinophils % 12/13/2017 0.0  % Final   ??? Basophils % 12/13/2017 0.0  % Final   ??? Absolute Neutrophils 12/13/2017 0.0  10*9/L Final    LOW   ??? Absolute Lymphocytes 12/13/2017 0.5  10*9/L Final    LOW   ??? Absolute Monocytes 12/13/2017 0.1  10*9/L Final   ??? Absolute Eosinophils 12/13/2017 0.0  10*9/L Final   ??? Absolute Basophils 12/13/2017 0.0  10*9/L Final   ??? Hypochromasia 12/13/2017 Moderate* Not Present Final   ??? WBC Morphology 12/13/2017 test check   Final    MARKED DECREASE BUFFY COAT SMEAR PERFORMED ON PREVIOUS SPECIMEN   ??? PLT Morphology 12/13/2017 test check   Final    DEC   Infusion on 12/10/2017   Component Date Value Ref Range Status   ??? ABO/Rh Typing 12/10/2017 B POSTIVE   Final   ??? Antibody Screen 12/10/2017 NEGATIVE   Final   ??? 1 Unit Crossmatch 12/10/2017 B POSTIVE   Final    PATIENT RECEIVED 1 UNIT O POSTIVE 12/10/17   Lab on 12/10/2017   Component Date Value Ref Range Status   ??? WBC 12/10/2017 0.5  10*9/L Final    LOW   ??? RBC 12/10/2017 2.67  10*12/L Final  LOW   ??? HGB 12/10/2017 8.0  g/dL Final    LOW   ??? HCT 12/10/2017 23.2  % Final    LOW   ??? MCV 12/10/2017 86.9  fL Final   ??? MCH 12/10/2017 30.0  pg Final   ??? MCHC 12/10/2017 34.5  g/dL Final   ??? RDW 16/08/9603 13.5  % Final   ??? MPV 12/10/2017 11.6  fL Final    HIGH   ??? Platelet 12/10/2017 35  10*9/L Final    LOW   ??? nRBC 12/10/2017 0  /100 WBCs Final   ??? Neutrophils % 12/10/2017 7.4  % Final   ??? Lymphocytes % 12/10/2017 64.8  % Final   ??? Monocytes % 12/10/2017 27.8  % Final   ??? Eosinophils % 12/10/2017 0.0  % Final   ??? Basophils % 12/10/2017 0.0  % Final   ??? Absolute Neutrophils 12/10/2017 0.0  10*9/L Final    LOW   ??? Absolute Lymphocytes 12/10/2017 0.4  10*9/L Final   ??? Absolute Monocytes 12/10/2017 0.2  10*9/L Final   ??? Absolute Eosinophils 12/10/2017 0.0  10*9/L Final   ??? Absolute Basophils 12/10/2017 0.0  10*9/L Final   ??? Sodium 12/10/2017 139  mmol/L Final   ??? Potassium 12/10/2017 4.3  mmol/L Final   ??? Chloride 12/10/2017 106  mmol/L Final   ??? CO2 12/10/2017 23.3  mmol/L Final   ??? BUN 12/10/2017 24  mg/dL Final    HIGH   ??? Creatinine 12/10/2017 0.83  mg/dL Final   ??? Glucose 54/07/8118 109  mg/dL Final    HIGH   ??? Calcium 12/10/2017 9.1  mg/dL Final   ??? Total Protein 12/10/2017 6.0  g/dL Final   ??? Total Bilirubin 12/10/2017 0.8  mg/dL Final   ??? AST 14/78/2956 19  U/L Final   ??? ALT 12/10/2017 34  U/L Final   ??? Alkaline Phosphatase 12/10/2017 129  U/L Final    HIGH   ??? EGFR MDRD Non Af Amer 12/10/2017 60  mL/min/1.41m2 Final   ??? EGFR MDRD Af Amer 12/10/2017 60  mL/min/1.85m2 Final   ??? Albumin 12/10/2017 3.27   Final    LOW   ??? Anion Gap 12/10/2017 15   Final   Hospital Outpatient Visit on 12/09/2017   Component Date Value Ref Range Status   ??? Platelet 12/09/2017 32* 150 - 440 10*9/L Final   ??? Unit Blood Type 12/10/2017 A Pos   Final   ??? ISBT Number 12/10/2017 [id number]   Final   ??? Unit # 12/10/2017 [id number]   Final   ??? Status 12/10/2017 Transfused   Final   ??? Product ID 12/10/2017 Platelets   Final   ??? PRODUCT CODE 12/10/2017 [id number]   Final   ??? Crossmatch 12/13/2017 Compatible   Final   ??? Unit Blood Type 12/13/2017 B Pos   Final   ??? ISBT Number 12/13/2017 [id number]   Final   ??? Unit # 12/13/2017 [id number]   Final   ??? Status 12/13/2017 Transfused   Final   ??? Product ID 12/13/2017 Red Blood Cells   Final   ??? PRODUCT CODE 12/13/2017 [id number]   Final   ??? Crossmatch 12/13/2017 Compatible   Final   ??? Unit Blood Type 12/13/2017 B Pos   Final   ??? ISBT Number 12/13/2017 [id number]   Final   ??? Unit # 12/13/2017 [id number]   Final   ??? Status 12/13/2017 Released to [employer]   Final   ??? Spec Expiration 12/13/2017  [id number]   Final   ??? Product ID 12/13/2017 Red Blood Cells   Final   ??? PRODUCT CODE 12/13/2017 [id number]   Final   Office Visit on 12/09/2017   Component Date Value Ref Range Status   ??? Antibody Screen 12/09/2017 NEG   Final   ??? ABO Grouping 12/09/2017 Invalid   Final   ??? Blood Type 12/09/2017 B POS   Final   Lab on 12/09/2017   Component Date Value Ref Range Status   ??? Sodium 12/09/2017 139  135 - 145 mmol/L Final   ??? Potassium 12/09/2017 4.4  3.5 - 5.0 mmol/L Final   ??? Chloride 12/09/2017 108* 98 - 107 mmol/L Final   ??? CO2 12/09/2017 23.0  22.0 - 30.0 mmol/L Final   ??? BUN 12/09/2017 24* 7 - 21 mg/dL Final   ??? Creatinine 12/09/2017 0.95  0.70 - 1.30 mg/dL Final   ??? BUN/Creatinine Ratio 12/09/2017 25   Final   ??? EGFR MDRD Non Af Amer 12/09/2017 >=60  >=60 mL/min/1.54m2 Final   ??? EGFR MDRD Af Amer 12/09/2017 >=60  >=60 mL/min/1.46m2 Final   ??? Anion Gap 12/09/2017 8* 9 - 15 mmol/L Final   ??? Glucose 12/09/2017 102  65 - 179 mg/dL Final   ??? Calcium 38/75/6433 9.3  8.5 - 10.2 mg/dL Final   ??? Albumin 29/51/8841 3.0* 3.5 - 5.0 g/dL Final   ??? Total Protein 12/09/2017 5.4* 6.5 - 8.3 g/dL Final   ??? Total Bilirubin 12/09/2017 0.8  0.0 - 1.2 mg/dL Final   ??? AST 66/04/3015 25  19 - 55 U/L Final   ??? ALT 12/09/2017 50  19 - 72 U/L Final   ??? Alkaline Phosphatase 12/09/2017 104  38 - 126 U/L Final   ??? Results Verified by Slide Scan 12/09/2017 Slide Reviewed   Final   ??? WBC 12/09/2017 0.7* 4.5 - 11.0 10*9/L Final   ??? RBC 12/09/2017 2.36* 4.50 - 5.90 10*12/L Final   ??? HGB 12/09/2017 7.1* 13.5 - 17.5 g/dL Final   ??? HCT 11/24/3233 20.7* 41.0 - 53.0 % Final   ??? MCV 12/09/2017 87.8  80.0 - 100.0 fL Final   ??? MCH 12/09/2017 30.3  26.0 - 34.0 pg Final   ??? MCHC 12/09/2017 34.5  31.0 - 37.0 g/dL Final   ??? RDW 57/32/2025 14.1  12.0 - 15.0 % Final   ??? MPV 12/09/2017 12.2* 7.0 - 10.0 fL Final   ??? Platelet 12/09/2017 22* 150 - 440 10*9/L Final    Questionable result confirmed. Specimen integrity/identity confirmed.    ??? nRBC 12/09/2017 2  <=4 /100 WBCs Final   ??? Absolute Neutrophils 12/09/2017 0.0* 2.0 - 7.5 10*9/L Final   ??? Absolute Lymphocytes 12/09/2017 0.3* 1.5 - 5.0 10*9/L Final   ??? Absolute Monocytes 12/09/2017 0.1* 0.2 - 0.8 10*9/L Final   ??? Absolute Eosinophils 12/09/2017 0.0 0.0 - 0.4 10*9/L Final   ??? Absolute Basophils 12/09/2017 0.0  0.0 - 0.1 10*9/L Final   ??? Large Unstained Cells 12/09/2017 45* 0 - 4 % Final    Monocytes present.    ??? Pathologist Smear Interpretation 12/09/2017 Confirmed by Hemepath Specialist/Senior Tech   Final   Infusion on 12/08/2017   Component Date Value Ref Range Status   ??? 1 Unit Crossmatch 12/08/2017 B POSTIVE   Final    PATIENT RECIEVED 1 UNIT O POSTIVE 12/08/17   Lab on 12/07/2017   Component Date Value Ref Range Status   ??? WBC 12/07/2017 0.6  10*9/L  Final    LOW   ??? RBC 12/07/2017 2.34  10*12/L Final    LOW   ??? HGB 12/07/2017 6.8  g/dL Final    LOW   ??? HCT 12/07/2017 20.9  % Final    LOW   ??? MCV 12/07/2017 89.3  fL Final   ??? MCH 12/07/2017 29.1  pg Final   ??? MCHC 12/07/2017 32.5  g/dL Final   ??? RDW 57/84/6962 13.5  % Final   ??? MPV 12/07/2017 12.6  fL Final    HIGH   ??? Platelet 12/07/2017 34  10*9/L Final    LOW   ??? nRBC 12/07/2017 5  /100 WBCs Final   ??? Neutrophils % 12/07/2017 6.8  % Final   ??? Lymphocytes % 12/07/2017 55.9  % Final   ??? Monocytes % 12/07/2017 28.8  % Final   ??? Eosinophils % 12/07/2017 0.0  % Final   ??? Basophils % 12/07/2017 0.0  % Final   ??? Absolute Neutrophils 12/07/2017 0.1  10*9/L Final    LOW   ??? Absolute Lymphocytes 12/07/2017 0.3  10*9/L Final    LOW   ??? Absolute Monocytes 12/07/2017 0.2  10*9/L Final   ??? Absolute Eosinophils 12/07/2017 0.0  10*9/L Final   ??? Absolute Basophils 12/07/2017 0.0  10*9/L Final   ??? Microcytosis 12/07/2017 Marked* Not Present Corrected   ??? Anisocytosis 12/07/2017 Marked* Not Present Corrected   ??? Hypochromasia 12/07/2017 Moderate* Not Present Corrected   ??? SEGS MAN 12/07/2017 4   Final    LOW   ??? Lymphs 12/07/2017 70   Final    HIGH   ??? Monocytes Manual 12/07/2017 26   Final    HIGH   ??? Poikilocytosis 12/07/2017 Marked   Final   ??? PLT Morphology 12/07/2017 test check   Final    DEC   ??? Sodium 12/07/2017 137  mmol/L Final   ??? Potassium 12/07/2017 4.3  mmol/L Final   ??? Chloride 12/07/2017 105  mmol/L Final ??? CO2 12/07/2017 22.2  mmol/L Final   ??? BUN 12/07/2017 22  mg/dL Final    HIGH   ??? Creatinine 12/07/2017 0.82  mg/dL Final   ??? Glucose 95/28/4132 101  mg/dL Final   ??? Calcium 44/11/270 9.3  mg/dL Final   ??? Total Protein 12/07/2017 6.3  g/dL Final   ??? Total Bilirubin 12/07/2017 1.1  mg/dL Final    HIGH   ??? AST 12/07/2017 18  U/L Final   ??? ALT 12/07/2017 15  U/L Final   ??? Alkaline Phosphatase 12/07/2017 101  U/L Final    HIGH   ??? EGFR MDRD Non Af Amer 12/07/2017 60  mL/min/1.93m2 Final   ??? EGFR MDRD Af Amer 12/07/2017 60  mL/min/1.68m2 Final   ??? Albumin 12/07/2017 3.2   Final    LOW   ??? Anion Gap 12/07/2017 14   Final   Hospital Outpatient Visit on 12/03/2017   Component Date Value Ref Range Status   ??? Unit Blood Type 12/04/2017 O Pos   Final   ??? ISBT Number 12/04/2017 [id number]   Final   ??? Unit # 12/04/2017 [id number]   Final   ??? Status 12/04/2017 Transfused   Final   ??? Product ID 12/04/2017 Platelets   Final   ??? PRODUCT CODE 12/04/2017 [id number]   Final   ??? Platelet 12/03/2017 50* 150 - 440 10*9/L Final   There may be more visits with results that are not included.  IMAGING STUDIES  No current imaging results    The total time spent discussing the previous history, imaging studies, laboratory studies, the role and rationale of AML therapy, and discussion was 30 minutes.  At least 50% of that time was spent in answering questions and counseling.    FOLLOW UP: AS DIRECTED       Ccc:

## 2017-12-16 NOTE — Unmapped (Signed)
Patient arrived to clinic ambulatory. Vital signs obtained.  Premedication given as ordered.  Chemotherapy given as ordered.  Patient tolerated well without complaint.   Patient d/c to home.  Patient aware of return appointment.  Patient instructed to arrive at 8:30 on 12/17/17, patient and wife verbalized understanding

## 2017-12-16 NOTE — Unmapped (Signed)
Addended by: Loni Muse on: 12/15/2017 06:01 PM     Modules accepted: Orders

## 2017-12-17 ENCOUNTER — Encounter: Admit: 2017-12-17 | Discharge: 2017-12-18 | Payer: PRIVATE HEALTH INSURANCE

## 2017-12-17 DIAGNOSIS — C9200 Acute myeloblastic leukemia, not having achieved remission: Secondary | ICD-10-CM | POA: Diagnosis not present

## 2017-12-17 DIAGNOSIS — C9300 Acute monoblastic/monocytic leukemia, not having achieved remission: Secondary | ICD-10-CM | POA: Diagnosis not present

## 2017-12-17 DIAGNOSIS — R0602 Shortness of breath: Secondary | ICD-10-CM | POA: Diagnosis not present

## 2017-12-17 DIAGNOSIS — Z5111 Encounter for antineoplastic chemotherapy: Secondary | ICD-10-CM | POA: Diagnosis not present

## 2017-12-17 DIAGNOSIS — Z79899 Other long term (current) drug therapy: Secondary | ICD-10-CM | POA: Diagnosis not present

## 2017-12-17 DIAGNOSIS — D6189 Other specified aplastic anemias and other bone marrow failure syndromes: Secondary | ICD-10-CM | POA: Diagnosis not present

## 2017-12-17 NOTE — Unmapped (Signed)
Patient arrived to clinic ambulatory.  Weight and vital signs obtained. Peripheral IV placed per protocol. Platelets and PRBCs given per order and protocol.  Premedication given as ordered.  Chemotherapy given as ordered.  Patient tolerated well without complaint. Peripheral IV d/c'd  Patient d/c to home.  Patient aware of return appointment.

## 2017-12-17 NOTE — Unmapped (Signed)
Addended by: Loni Muse on: 12/17/2017 02:08 PM     Modules accepted: Orders

## 2017-12-18 DIAGNOSIS — I252 Old myocardial infarction: Secondary | ICD-10-CM | POA: Diagnosis not present

## 2017-12-18 DIAGNOSIS — K5900 Constipation, unspecified: Secondary | ICD-10-CM | POA: Diagnosis not present

## 2017-12-18 DIAGNOSIS — C9200 Acute myeloblastic leukemia, not having achieved remission: Secondary | ICD-10-CM | POA: Diagnosis not present

## 2017-12-18 DIAGNOSIS — Z87891 Personal history of nicotine dependence: Secondary | ICD-10-CM | POA: Diagnosis not present

## 2017-12-18 DIAGNOSIS — I10 Essential (primary) hypertension: Secondary | ICD-10-CM | POA: Diagnosis not present

## 2017-12-18 DIAGNOSIS — E7800 Pure hypercholesterolemia, unspecified: Secondary | ICD-10-CM | POA: Diagnosis not present

## 2017-12-18 DIAGNOSIS — K219 Gastro-esophageal reflux disease without esophagitis: Secondary | ICD-10-CM | POA: Diagnosis not present

## 2017-12-18 DIAGNOSIS — Z79899 Other long term (current) drug therapy: Secondary | ICD-10-CM | POA: Diagnosis not present

## 2017-12-19 DIAGNOSIS — K219 Gastro-esophageal reflux disease without esophagitis: Secondary | ICD-10-CM | POA: Diagnosis not present

## 2017-12-19 DIAGNOSIS — I10 Essential (primary) hypertension: Secondary | ICD-10-CM | POA: Diagnosis not present

## 2017-12-19 DIAGNOSIS — R627 Adult failure to thrive: Secondary | ICD-10-CM | POA: Diagnosis not present

## 2017-12-19 DIAGNOSIS — H61329 Acquired stenosis of external ear canal secondary to inflammation and infection, unspecified ear: Secondary | ICD-10-CM | POA: Diagnosis not present

## 2017-12-19 DIAGNOSIS — C9210 Chronic myeloid leukemia, BCR/ABL-positive, not having achieved remission: Secondary | ICD-10-CM | POA: Diagnosis not present

## 2017-12-19 DIAGNOSIS — I714 Abdominal aortic aneurysm, without rupture: Secondary | ICD-10-CM | POA: Diagnosis not present

## 2017-12-19 DIAGNOSIS — Z955 Presence of coronary angioplasty implant and graft: Secondary | ICD-10-CM | POA: Diagnosis not present

## 2017-12-19 DIAGNOSIS — I2510 Atherosclerotic heart disease of native coronary artery without angina pectoris: Secondary | ICD-10-CM | POA: Diagnosis not present

## 2017-12-19 DIAGNOSIS — D6189 Other specified aplastic anemias and other bone marrow failure syndromes: Secondary | ICD-10-CM | POA: Diagnosis not present

## 2017-12-19 DIAGNOSIS — R1310 Dysphagia, unspecified: Secondary | ICD-10-CM | POA: Diagnosis not present

## 2017-12-19 DIAGNOSIS — I252 Old myocardial infarction: Secondary | ICD-10-CM | POA: Diagnosis not present

## 2017-12-20 ENCOUNTER — Encounter: Admit: 2017-12-20 | Discharge: 2017-12-20 | Payer: PRIVATE HEALTH INSURANCE

## 2017-12-20 DIAGNOSIS — Z5111 Encounter for antineoplastic chemotherapy: Secondary | ICD-10-CM | POA: Diagnosis not present

## 2017-12-20 DIAGNOSIS — C9200 Acute myeloblastic leukemia, not having achieved remission: Secondary | ICD-10-CM | POA: Diagnosis not present

## 2017-12-20 DIAGNOSIS — C9300 Acute monoblastic/monocytic leukemia, not having achieved remission: Secondary | ICD-10-CM | POA: Diagnosis not present

## 2017-12-20 LAB — CBC W/ DIFFERENTIAL
BASOPHILS ABSOLUTE COUNT: 0 10*9/L
BASOPHILS RELATIVE PERCENT: 0 %
EOSINOPHILS ABSOLUTE COUNT: 0 10*9/L
EOSINOPHILS RELATIVE PERCENT: 0 %
HEMATOCRIT: 25.5 %
HEMOGLOBIN: 8.4 g/dL
LYMPHOCYTES ABSOLUTE COUNT: 0.5 10*9/L
LYMPHOCYTES RELATIVE PERCENT: 92 %
MEAN CORPUSCULAR HEMOGLOBIN: 29.7 pg
MEAN CORPUSCULAR VOLUME: 90.1 fL
MEAN PLATELET VOLUME: 12.7 fL
MONOCYTES ABSOLUTE COUNT: 0 10*9/L
MONOCYTES RELATIVE PERCENT: 6 %
NEUTROPHILS ABSOLUTE COUNT: 0 10*9/L
NEUTROPHILS RELATIVE PERCENT: 2 %
NUCLEATED RED BLOOD CELLS: 0 /100{WBCs}
RED BLOOD CELL COUNT: 2.83 10*12/L
RED CELL DISTRIBUTION WIDTH: 13.2 %
WHITE BLOOD CELL COUNT: 0.5 10*9/L

## 2017-12-20 LAB — COMPREHENSIVE METABOLIC PANEL
ALBUMIN: 3.4
ALKALINE PHOSPHATASE: 106 U/L
ALT (SGPT): 25 U/L
ANION GAP: 17
AST (SGOT): 15 U/L
BLOOD UREA NITROGEN: 34 mg/dL
CALCIUM: 9.4 mg/dL
CHLORIDE: 103 mmol/L
CO2: 21.4 mmol/L
CREATININE: 1.14 mg/dL
EGFR MDRD AF AMER: 60 mL/min/{1.73_m2}
EGFR MDRD NON AF AMER: 60 mL/min/{1.73_m2}
POTASSIUM: 4.6 mmol/L
SODIUM: 137 mmol/L

## 2017-12-20 LAB — BILIRUBIN TOTAL: Lab: 1

## 2017-12-20 LAB — ANISOCYTOSIS: Lab: 0

## 2017-12-20 NOTE — Unmapped (Signed)
Patient arrived to clinic ambulatory.  Vital signs obtained.  Patient received azacitidine injection as ordered in divided doses to right and left abd.  Patient tolerated well without complaint.  Patient d/c to home and aware of return appointment.

## 2017-12-21 ENCOUNTER — Encounter: Admit: 2017-12-21 | Discharge: 2017-12-22 | Payer: PRIVATE HEALTH INSURANCE

## 2017-12-21 DIAGNOSIS — Z79899 Other long term (current) drug therapy: Secondary | ICD-10-CM | POA: Diagnosis not present

## 2017-12-21 DIAGNOSIS — C9200 Acute myeloblastic leukemia, not having achieved remission: Secondary | ICD-10-CM | POA: Diagnosis not present

## 2017-12-21 DIAGNOSIS — H61329 Acquired stenosis of external ear canal secondary to inflammation and infection, unspecified ear: Secondary | ICD-10-CM | POA: Diagnosis not present

## 2017-12-21 DIAGNOSIS — K219 Gastro-esophageal reflux disease without esophagitis: Secondary | ICD-10-CM | POA: Diagnosis not present

## 2017-12-21 DIAGNOSIS — Z5111 Encounter for antineoplastic chemotherapy: Secondary | ICD-10-CM | POA: Diagnosis not present

## 2017-12-21 DIAGNOSIS — R627 Adult failure to thrive: Secondary | ICD-10-CM | POA: Diagnosis not present

## 2017-12-21 DIAGNOSIS — I714 Abdominal aortic aneurysm, without rupture: Secondary | ICD-10-CM | POA: Diagnosis not present

## 2017-12-21 DIAGNOSIS — D6189 Other specified aplastic anemias and other bone marrow failure syndromes: Secondary | ICD-10-CM | POA: Diagnosis not present

## 2017-12-21 DIAGNOSIS — R1310 Dysphagia, unspecified: Secondary | ICD-10-CM | POA: Diagnosis not present

## 2017-12-21 DIAGNOSIS — C9210 Chronic myeloid leukemia, BCR/ABL-positive, not having achieved remission: Secondary | ICD-10-CM | POA: Diagnosis not present

## 2017-12-21 DIAGNOSIS — I2510 Atherosclerotic heart disease of native coronary artery without angina pectoris: Secondary | ICD-10-CM | POA: Diagnosis not present

## 2017-12-21 DIAGNOSIS — I252 Old myocardial infarction: Secondary | ICD-10-CM | POA: Diagnosis not present

## 2017-12-21 DIAGNOSIS — Z955 Presence of coronary angioplasty implant and graft: Secondary | ICD-10-CM | POA: Diagnosis not present

## 2017-12-21 DIAGNOSIS — I10 Essential (primary) hypertension: Secondary | ICD-10-CM | POA: Diagnosis not present

## 2017-12-21 DIAGNOSIS — C9300 Acute monoblastic/monocytic leukemia, not having achieved remission: Secondary | ICD-10-CM

## 2017-12-21 NOTE — Unmapped (Signed)
Patient arrived to clinic ambulatory.  Weight and vital signs obtained.  Patient received vidaza injection as ordered in divided doses.  Patient tolerated well without complaint.  Patient d/c to home and aware of return appointment.

## 2017-12-22 ENCOUNTER — Ambulatory Visit: Admit: 2017-12-22 | Discharge: 2017-12-22 | Payer: PRIVATE HEALTH INSURANCE

## 2017-12-22 ENCOUNTER — Encounter: Admit: 2017-12-22 | Discharge: 2017-12-22 | Payer: PRIVATE HEALTH INSURANCE

## 2017-12-22 DIAGNOSIS — Z5111 Encounter for antineoplastic chemotherapy: Secondary | ICD-10-CM | POA: Diagnosis not present

## 2017-12-22 DIAGNOSIS — C9200 Acute myeloblastic leukemia, not having achieved remission: Secondary | ICD-10-CM | POA: Diagnosis not present

## 2017-12-22 DIAGNOSIS — C9300 Acute monoblastic/monocytic leukemia, not having achieved remission: Secondary | ICD-10-CM | POA: Diagnosis not present

## 2017-12-22 DIAGNOSIS — R0602 Shortness of breath: Secondary | ICD-10-CM | POA: Diagnosis not present

## 2017-12-22 DIAGNOSIS — Z6820 Body mass index (BMI) 20.0-20.9, adult: Secondary | ICD-10-CM | POA: Diagnosis not present

## 2017-12-22 DIAGNOSIS — R072 Precordial pain: Secondary | ICD-10-CM | POA: Diagnosis not present

## 2017-12-22 DIAGNOSIS — D61810 Antineoplastic chemotherapy induced pancytopenia: Secondary | ICD-10-CM | POA: Diagnosis not present

## 2017-12-22 DIAGNOSIS — T451X5A Adverse effect of antineoplastic and immunosuppressive drugs, initial encounter: Secondary | ICD-10-CM

## 2017-12-22 LAB — CBC W/ DIFFERENTIAL
BASOPHILS ABSOLUTE COUNT: 0 10*9/L
BASOPHILS RELATIVE PERCENT: 0 %
EOSINOPHILS RELATIVE PERCENT: 0 %
HEMATOCRIT: 24.8 %
HEMOGLOBIN: 8 g/dL
LYMPHOCYTES RELATIVE PERCENT: 87.9 %
MEAN CORPUSCULAR HEMOGLOBIN CONC: 32.3 g/dL
MEAN CORPUSCULAR HEMOGLOBIN: 29.3 pg
MEAN PLATELET VOLUME: 10.3 fL
MONOCYTES ABSOLUTE COUNT: 0 10*9/L
MONOCYTES RELATIVE PERCENT: 6.1 %
NEUTROPHILS ABSOLUTE COUNT: 0 10*9/L
NEUTROPHILS RELATIVE PERCENT: 6 %
NUCLEATED RED BLOOD CELLS: 0 /100{WBCs}
PLATELET COUNT: 9 10*9/L
RED BLOOD CELL COUNT: 2.73 10*12/L
RED CELL DISTRIBUTION WIDTH: 13 %
WBC ADJUSTED: 0.3 10*9/L

## 2017-12-22 LAB — COMPREHENSIVE METABOLIC PANEL
ALBUMIN: 3.45
ALKALINE PHOSPHATASE: 113 U/L
ALT (SGPT): 26 U/L
ANION GAP: 14
AST (SGOT): 15 U/L
BILIRUBIN TOTAL: 0.8 mg/dL
BLOOD UREA NITROGEN: 29 mg/dL
CALCIUM: 9.4 mg/dL
CHLORIDE: 106 mmol/L
CO2: 19.4 mmol/L
CREATININE: 1.17 mg/dL
EGFR MDRD AF AMER: 60 mL/min/{1.73_m2}
GLUCOSE RANDOM: 110 mg/dL
PROTEIN TOTAL: 6.4 g/dL
SODIUM: 135 mmol/L

## 2017-12-22 LAB — CHLORIDE: Lab: 106

## 2017-12-22 LAB — ANISOCYTOSIS

## 2017-12-22 NOTE — Unmapped (Signed)
Patient arrived to clinic ambulatory.  Weight and vital signs obtained.  Patient received injection as ordered.  Patient tolerated well without complaint.  Patient c/o SOB even while just standing.  Checked O2 sat while patient standing.  O2 at 100%, but heart rate up to 127.  Dr. Angelene Giovanni notified.  Orders given for repeat labs today and EKG in office.  Unable to get a clear EKG.  Patient was instructed to follow up with PCP or cardiologist.  Spoke with patient's wife.  She states they will contact Dr. Garner Nash.  Patient d/c to home and aware of return appointment.

## 2017-12-23 ENCOUNTER — Encounter: Admit: 2017-12-23 | Discharge: 2017-12-24 | Payer: PRIVATE HEALTH INSURANCE

## 2017-12-23 DIAGNOSIS — C9300 Acute monoblastic/monocytic leukemia, not having achieved remission: Secondary | ICD-10-CM | POA: Diagnosis not present

## 2017-12-23 DIAGNOSIS — Z5111 Encounter for antineoplastic chemotherapy: Secondary | ICD-10-CM | POA: Diagnosis not present

## 2017-12-23 DIAGNOSIS — D6189 Other specified aplastic anemias and other bone marrow failure syndromes: Secondary | ICD-10-CM | POA: Diagnosis not present

## 2017-12-23 DIAGNOSIS — C9200 Acute myeloblastic leukemia, not having achieved remission: Secondary | ICD-10-CM | POA: Diagnosis not present

## 2017-12-23 DIAGNOSIS — K1379 Other lesions of oral mucosa: Secondary | ICD-10-CM

## 2017-12-23 MED ORDER — MAGIC MOUTHWASH WITH LIDOCAINE ORAL SUSPENSION
Freq: Four times a day (QID) | ORAL | 3 refills | 0 days | Status: CP
Start: 2017-12-23 — End: ?

## 2017-12-23 NOTE — Unmapped (Signed)
??  [facility], [facility]   Hematology Oncology Return Visit   DATE OF SERVICE  12/23/2017     REFERRING PROVIDER   Richardean Chimera, Md  [address] 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    CANCER HISTORY  Oncology History    Diagnosis: AML    Presentation: Pancytopenia    Bone marrow biopsy: 09/14/2017  Hypercellular (>95%) marrow with approximately 40% blasts  Dysmegakaryopoiesis  Aspirate- predominance of blasts- no manual aspirate differential given    Flow- 39% blasts- CD34+ (subset), CD13, CD15, CD7, CD11c ,CD33, CD117, HLA-DR    Cytogenetics: Normal    Molecular Mutations: Not done    Treatment:  Azacitidine 75 mg/m2 SQ days 1-7  Cycle 1: 11/07    C/b multiple infections and hospitalizations.    Myeloid Panel from [facility]:  BCOR: c.2124delC (p.Gly709AlafsTer)  FLT3: c.2505T>G (p.Asp835Glu) [FLT3-TKD]  NPM1: c.860_863dupTCTG (p.Trp288CysfsTer12) [Type A Mutation]  WT1: c.1144delG (p.Ala382HisfsTer67)    Cycle 2: Azacitidine  - 11/12/2017    Venetoclax initiated on 11/19/2017      Final Diagnosis   Date Value Ref Range Status   12/03/2017   Final    Bone marrow, right iliac, aspiration and biopsy  -  Hypercellular bone marrow (80%) with persistent involvement by acute myeloid leukemia (32% blasts by manual aspirate differentiation).  -  Cytogenetic studies are pending.  -  Flow cytometric MRD analysis is pending.  -  NPM1 quantitation study is pending.       Flow MRD = Undetectable        Acute myeloid leukemia (AML), M5 (CMS-HCC)    09/17/2017 Initial Diagnosis     Acute myeloid leukemia (AML), M5 (CMS-HCC)          CANCER STAGING  Cancer Staging  No matching staging information was found for the patient.    CURRENT HISTORY  Patient was seen today in the infusion center he received 2 units PRBC's  and PLT's.   He spent most of the day sleeping owing to the effects of Benadryl.  He restarted Azacitadine 75 mg/m2 sq x 7 days on December 15 2017.   He states that his energy level is terrible due to SOB despite normal RA sat.  He is experiencing worsening mouth sores but hasn't started anything yet but will begin magic mouthwash tonight.  He denies any bleeding problems, fevers chills or night sweats.  Weight stable compared with last visit.  Spends most of his time in the recliner.  Saw PCP yesterday.         ASSESSMENT    72 year old male with history of coronary artery disease, MI, GERD, colon polyps, tobacco dependence and overall poor performance status.    Patient has AML with normal karyotype however myeloid panel from peripheral blood revealed mutations in NPM1, FLT3-D835, BCOR, and WT1.  Patient has been treated with a cycle of cytarabine/ venetoclax followed by venetoclax.  Course has also been complicated by persistent cytopenias.    RECOMMENDATION/PLAN    AML with with normal karyotype but with mutations in NPM1, FLT3-D835,  BCOR, and WT1.  Course of therapy has been comp located by persistent cytopenias.  Bone marrow biopsy obtained on December 03, 2017 shows a considerable amount of residual disease.  The clinical picture and that the patient is more robust in this visit suggest that he is benefiting from palliative chemotherapy as resolved at this we will proceed with the next cycle of chemotherapy in the form of single agent azacytidine to commands the week of December 13, 2017.    Possible otitis: Completed the course of Augmentin 875 mg po bid x 14 days on December 15 2017.        Transfusion support:  Transfuse 2 units of packed red blood cells for hemoglobin less than 8 and 1 unit of PLT's if  PLT count is less than 10 and/or actively bleeding.  Will check laboratories twice weekly at least so that he may be adequately transfused.      Infection prophylaxis: Currently on Levaquin, posaconazole, and acyclovir for prophylaxis against bacterial, fungal and HSV infections respectively    Goals of care: Patient and his wife understand that the goal of therapy is palliative.      Shortness of breath:  BNP on January 30th 2019 does not suggest a cardiac cause.  The normal RA O2 sat does not suggest a pulmonary cause.  Marland Kitchen      Poor performance status: Likely multifactorial but the main culprit is likely his underlying malignancy.  Declining    Prognosis: Patient and I discussed his overall prognosis which is poor and clearly measurable in months.  At this time he wishes to continue supportive care and chemotherapy as long as he feels it is beneficial.  We discussed advanced directives and he wishes to be DNR.  He understands that he is not a candidate for myelo ablative chemotherapy or bone marrow transplant.    HISTORY OF PRESENT ILLNESS   Demetruis Depaul is a 72 y.o. 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  He had associated with this extreme fatigue, generalized weakness, malaise, and near nightly damp sweats.  ??  On September 14, 2017 a bone marrow aspiration and biopsy was performed at [facility]. 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 Correlation with cytogenetics and fluorescent in situ hybridization was requested.  Evaluation by [facility] Pathology revealed  a (46, XY) karyotype.  DNA probe specific molecular analysis failed to reveal any cytogenetic abnormality on chromosome 8 to rule out the presence of trisomy 8.  Unfortunately further molecular profiling was not performed although requested by Dr. Donnie Coffin.  Molecular studies for certain abnormalities, such as mutations in FLT3, nucleophosmin (NPM1), KIT, CEBPA, or RUNX1 as well as gene expression profiles confer prognostic significance in adults with AML. AML with mutations of NPM1, RUNX1, biallelic CEBPA, PML-RARA, and others are included as specific entities in the World Health Organization classification.    ??  On September 22, 2017 he was started on cycle 1 of single agent azacitidine once daily for 7 days out of 28.  With the exception of hematologic toxicity in the form  of cytopenias, overall tolerance had been acceptable.  ??  On October 04, 2017 Patient was admitted to [facility] for evaluation and management of extreme fatigue.  While hospitalized he received IVF, infectious workup and evaluation; empiric antimicrobial therapy pending the results of pancultures, 2 view chest x-ray, EKG and echocardiogram, troponin levels, and supportive care.  After 2 hospital days, he was discharged home.     ??On October 24, 2017 he was admitted with high fever (102-103) to the service of Dr. Donzetta Sprung, his primary care physician.  In addition to anemia and thrombocytopenia, he felt weak and tired.  Pancultures were obtained and he was started on empiric antibiotic therapy with vancomycin and Zosyn.  Several will sets of blood cultures were negative.  He was transfused 2 units of packed red blood cells.  At the time of his discharge on December 14, he was afebrile.  While hospitalized, his white blood count had risen to 25-30K with blasts in his peripheral blood.  His fever was felt to be due to his underlying leukemic process.  ??  Cycle 2 of azacitidine was scheduled to begin on November 01 2017.  That treatment was deferred due to hospitalization and fever workup and empiric antibiotics which failed to reveal any evidence of infection.    ??  Because of his deteriorating laboratory and clinical condition, he was admitted to [facility] on November 11, 2017.  Under the direction of Dr. Cristal Deer Dittus, he was started on hydroxyurea, then transitioned to azacitidine and venetoclax beginning November 11, 2017.  Azacitidine was completed on November 18, 2017. This represented Cycle 2 of chemotherapy.  Though treated with cefepime from December 31 to January 4, then transition to levofloxacin at the time of discharge, his cultures were persistently negative.CT imaging of the chest without intravenous contrast was performed on December 31 at [facility] revealed bibasilar subpleural opacities consistent with fibrosis.  No discrete infiltrate or consolidation was identified. His anorexia, and nutritional status and functional status were poor necessitating admission to [facility].      Patient underwent bone marrow bx and aspirate performed at [facility] on 12/03/2017.   Pathology from this revealed a hypercellular bone marrow (80%) with persistent involvement by acute myeloid leukemia (32% blasts by manual aspirate differentiation).  Cytogenetic studies, flow cytometric MRD analysis and NPM1 quantitation study are pending.     The patient was seen at [facility] on 12/09/17 by Dr. Artemio Aly in follow up and it was recommended that Azacitadine be continued despite the cytopenias.  He received a 1 of 2 planned units of PRBC's and a unit of PLT's yesterday on that day due to time constraints.  He receied a second unit of PRBC's the following day.  He was discharged from the [facility] facility to home on December 11 2017     Restarted Azacitadine 75 mg/m2 sq x 7 days on December 15 2017.  BNP on 12/15/17 was 255.1 pg/mL  ??  ??PAST MEDICAL HISTORY  Past Medical History:   Diagnosis Date   ??? Coronary artery disease    ??? Depression    ??? Dissecting aortic aneurysm (any part), abdominal (CMS-HCC)    ??? ED (erectile dysfunction)    ??? GERD (gastroesophageal reflux disease)    ??? HTN (hypertension)    ??? Hyperlipidemia    ??? Ischemic heart disease    ??? Osteoarthritis of knee        SURGICAL HISTORY  Past Surgical History:   Procedure Laterality Date   ??? CARDIAC CATHETERIZATION     ??? COLONOSCOPY  2009   ??? CORONARY ANGIOPLASTY WITH STENT PLACEMENT     ??? KNEE SURGERY     ??? TOTAL KNEE ARTHROPLASTY         ALLERGIES:  Allergies   Allergen Reactions   ??? Ace Inhibitors Cough   ??? Shellfish Containing Products Nausea And Vomiting   ??? Zithromax [Azithromycin] Rash       MEDICATIONS:    Current Outpatient Prescriptions:   ???  acyclovir (ZOVIRAX) 400 MG tablet, Take 400 mg by mouth Two (2) times a day., Disp: , Rfl:   ???  allopurinol (ZYLOPRIM) 300 MG tablet, Take 1 tablet (300 mg total) by mouth daily., Disp: 30 tablet, Rfl: 0  ???  diphenhydramine HCl (MAGIC MOUTHWASH WITH LIDOCAINE ORAL) suspension, Take 10 mL by mouth Four (4) times a day., Disp: 240 mL, Rfl: 3  ???  famotidine (PEPCID) 20 MG tablet, Take 1 tablet (20 mg total) by mouth Two (2) times a day., Disp: 60 tablet, Rfl: 0  ???  levoFLOXacin (LEVAQUIN) 500 MG tablet, Take 500 mg by mouth daily., Disp: , Rfl:   ???  megestrol (MEGACE) 400 mg/10 mL (40 mg/mL) suspension, TAKE BY MOUTH TWICE DAILY, Disp: , Rfl: 0  ???  mirtazapine (REMERON) 15 MG tablet, Take 1 tablet (15 mg total) by mouth nightly., Disp: 90 tablet, Rfl: 3  ???  ondansetron (ZOFRAN) 8 MG tablet, Take 1 tablet (8 mg total) by mouth every twelve (12) hours as needed for nausea., Disp: 20 tablet, Rfl: 0  ???  polyethylene glycol (MIRALAX) 17 gram packet, MIX 1 PACKET OF POWDER (17 GM) IN 8 OZ OF JUICE OR WATER AND DRINK BY MOUTH DAILY, Disp: , Rfl: 6  ???  posaconazole (NOXAFIL) 100 mg TbEC delayed released tablet, Take 300 mg by mouth daily., Disp: 90 tablet, Rfl: 2  ??? senna (SENOKOT) 8.6 mg tablet, Take 2 tablets by mouth Two (2) times a day. Hold for diarrhea., Disp: 120 tablet, Rfl: 0  ???  venetoclax (VENCLEXTA) 100 mg tablet, Take 1 tablet (100 mg total) by mouth daily with evening meal. Take with a meal and water. Do not chew, crush, or break tablets., Disp: 120 tablet, Rfl: 6  No current facility-administered medications for this visit.     Facility-Administered Medications Ordered in Other Visits:   ???  OKAY TO SEND MEDICATION/CHEMOTHERAPY TO UNIT, , Other, Once, Trixie Dredge, MD  ???  sodium chloride (NS) 0.9 % infusion, , Intravenous, Continuous, Melissa Lynford Citizen, NP, 500 mL at 12/23/17 0900       REVIEW OF SYSTEMS  Constitutional: No fever, sweats, or shaking chills. No weight changes since last visit,  ECOG Status is 3  HEENT: No visual changes.  No changes in voice.  Has mouth sores.     Pulmonary: No unusual cough, sore throat, or orthopnea. SOB on standing up.    Cardiovascular: Has history of coronary artery disease and MI.  No angina, or palpitations.     Gastrointestinal: Has nausea but no vomiting, dysphagia, odynophagia, abdominal pain, diarrhea, or constipation. No change in bowel habits.   Genitourinary: No frequency, urgency, hematuria, or dysuria.   Musculoskeletal: No arthralgias or myalgias; no back pain;  no joint swelling, pain or instability.   Hematologic: No bleeding tendency or easy bruisability.   Endocrine: Intolerance to cold; no thyroid disease or diabetes mellitus.   Skin:  No rash, scaling, sores, lumps, or jaundice.  Vascular: No peripheral arterial or venous thromboembolic disease.   Psychological: No anxiety, depression, or mood changes; no mental health illnesses.   Neurological: Has dizziness, lightheadedness.  No syncope, or near syncopal episodes; no numbness or tingling in the fingers or toes.     PHYSICAL EXAMINATION  There were no vitals taken for this visit.   Reviewed vitals taken by NSG staff.    General:   Comfortable, fatigued.  Here alone.       Eyes:   Pupil equal round reacting to light and accomodation.  Extra occular muscles intact, and sclera clear and without icteris.   ENT:   Oropharynx without mucositis, or thrush.  Mucosa dry.      Neck:   Supple without any enlargement, no thyromegaly, bruit, or jugular venous distention.   Lymph Nodes:  No adenopathy (cervical, supraclavicular, axillary, inguinal)   Cardiovascular:  RRR, normal S1, S2 without murmur, rub, or gallop.  Pulses 2+ equal on both sides without any bruits.   Lungs:  Clear to auscultation bilaterally, without wheezes/crackles/rhonchi.  Good air movement.   Skin:    No rashes, jaundice, breakdown   Psychiatry:   Alert and oriented to person, place, and time    Abdomen:   Normoactive bowel sounds, abdomen soft, non-tender and not distended, no Hepatosplenomegaly or masses.  Liver normal in size, no rebound or guarding.    Extremities:   No bilateral cyanosis, clubbing or edema.  No rash, lesions, or petechiae.   Musculo Skeletal:   No joint tenderness, deformity, effusions.  No spine or costovertebral angle tenderness.  Full range of motion in shoulder, elbow, hip, knee, ankle, hands and feet.  Decreased muscle mass.   Neurological:  Alert and oriented to person, place and time.  Cranial nerves II-XII grossly intact, unsteady gait, normal sensation throughout, normal cerebellar function.           LABORATORY STUDIES  Infusion on 12/23/2017   Component Date Value Ref Range Status   ??? ABO/Rh Typing 12/22/2017 B POSTIVE   Final   ??? Antibody ID 12/22/2017 NOT DONE   Final   ??? Antibody Screen 12/22/2017 NEGATIVE   Final   ??? Platelet Product 12/23/2017 B POSTIVE   Final    PATIENT RECIEVED 1 UNIT PLT PHERESIS 12/23/17   Lab on 12/22/2017   Component Date Value Ref Range Status   ??? WBC 12/22/2017 0.3  10*9/L Final    LOW   ??? RBC 12/22/2017 2.73  10*12/L Final    LOW   ??? HGB 12/22/2017 8.0  g/dL Final    LOW   ??? HCT 12/22/2017 24.8  % Final    LOW   ??? MCV 12/22/2017 90.8 fL Final   ??? MCH 12/22/2017 29.3  pg Final   ??? MCHC 12/22/2017 32.3  g/dL Final   ??? RDW 29/56/2130 13.0  % Final   ??? MPV 12/22/2017 10.3  fL Final   ??? Platelet 12/22/2017 9  10*9/L Final    LOW   ??? nRBC 12/22/2017 0  /100 WBCs Final   ??? Neutrophils % 12/22/2017 6.0  % Final   ??? Lymphocytes % 12/22/2017 87.9  % Final   ??? Monocytes % 12/22/2017 6.1  % Final   ??? Eosinophils % 12/22/2017 0.0  % Final   ??? Basophils % 12/22/2017 0.0  % Final   ??? Absolute Neutrophils 12/22/2017 0.0  10*9/L Final    LOW   ???  Absolute Lymphocytes 12/22/2017 0.3  10*9/L Final    LOW   ??? Absolute Monocytes 12/22/2017 0.0  10*9/L Final    LOW   ??? Absolute Eosinophils 12/22/2017 0.0  10*9/L Final   ??? Absolute Basophils 12/22/2017 0.0  10*9/L Final   ??? Microcytosis 12/22/2017 Marked* Not Present Final   ??? Anisocytosis 12/22/2017 Marked* Not Present Final   ??? Hypochromasia 12/22/2017 Moderate* Not Present Final   ??? Poikilocytosis 12/22/2017 Occasional   Final   ??? WBC Morphology 12/22/2017 test check   Final    MARKED LEUKOCYTOPENIA NOTED, MARKED LYMPHOCYTOPENIA NOTED, DECRESED NEUTROPHILIA NOTED   ??? Sodium 12/22/2017 135  mmol/L Final    LOW   ??? Potassium 12/22/2017 4.6  mmol/L Final   ??? Chloride 12/22/2017 106  mmol/L Final   ??? CO2 12/22/2017 19.4  mmol/L Final    LOW   ??? BUN 12/22/2017 29  mg/dL Final    HIGH   ??? Creatinine 12/22/2017 1.17  mg/dL Final   ??? Glucose 86/57/8469 110  mg/dL Final    HIGH   ??? Calcium 12/22/2017 9.4  mg/dL Final   ??? Total Protein 12/22/2017 6.4  g/dL Final   ??? Total Bilirubin 12/22/2017 0.8  mg/dL Final   ??? AST 62/95/2841 15  U/L Final   ??? ALT 12/22/2017 26  U/L Final   ??? Alkaline Phosphatase 12/22/2017 113  U/L Final    HIGH   ??? EGFR MDRD Non Af Amer 12/22/2017 60  mL/min/1.77m2 Final   ??? EGFR MDRD Af Amer 12/22/2017 60  mL/min/1.24m2 Final   ??? Albumin 12/22/2017 3.45   Final    LOW   ??? Anion Gap 12/22/2017 14   Final   Lab on 12/20/2017   Component Date Value Ref Range Status   ??? WBC 12/20/2017 0.5  10*9/L Final LOW   ??? RBC 12/20/2017 2.83  10*12/L Final    LOW   ??? HGB 12/20/2017 8.4  g/dL Final    LOW   ??? HCT 12/20/2017 25.5  % Final    LOW   ??? MCV 12/20/2017 90.1  fL Final   ??? MCH 12/20/2017 29.7  pg Final   ??? MCHC 12/20/2017 32.9  g/dL Final   ??? RDW 32/44/0102 13.2  % Final   ??? MPV 12/20/2017 12.7  fL Final    HIGH   ??? Platelet 12/20/2017 22  10*9/L Final    LOW   ??? nRBC 12/20/2017 0  /100 WBCs Final   ??? Neutrophils % 12/20/2017 2.0  % Final   ??? Lymphocytes % 12/20/2017 92.0  % Final   ??? Monocytes % 12/20/2017 6.0  % Final   ??? Eosinophils % 12/20/2017 0.0  % Final   ??? Basophils % 12/20/2017 0.0  % Final   ??? Absolute Neutrophils 12/20/2017 0.0  10*9/L Final    LOW   ??? Absolute Lymphocytes 12/20/2017 0.5  10*9/L Final    LOW   ??? Absolute Monocytes 12/20/2017 0.0  10*9/L Final    LOW   ??? Absolute Eosinophils 12/20/2017 0.0  10*9/L Final   ??? Absolute Basophils 12/20/2017 0.0  10*9/L Final   Infusion on 12/17/2017   Component Date Value Ref Range Status   ??? 1 Unit Crossmatch 12/16/2017 O POSITVE   Final    1 UNIT O POSTIVE WAS GIVEN 12/17/17   ??? Platelets 12/17/2017 B POSTIVE   Final    1 UNIT B POSTIVE PLATELETS   ??? ABO/Rh Typing 12/16/2017 B POSTIVE  Final   ??? Sodium 12/20/2017 137  mmol/L Final   ??? Potassium 12/20/2017 4.6  mmol/L Final   ??? Chloride 12/20/2017 103  mmol/L Final   ??? CO2 12/20/2017 21.4  mmol/L Final    LOW   ??? BUN 12/20/2017 34  mg/dL Final    HIGH   ??? Creatinine 12/20/2017 1.14  mg/dL Final   ??? Glucose 16/08/9603 113  mg/dL Final    HIGH   ??? Calcium 12/20/2017 9.4  mg/dL Final   ??? Total Protein 12/20/2017 6.4  g/dL Final   ??? Total Bilirubin 12/20/2017 1.0  mg/dL Final   ??? AST 54/07/8118 15  U/L Final   ??? ALT 12/20/2017 25  U/L Final   ??? Alkaline Phosphatase 12/20/2017 106  U/L Final    HIGH   ??? EGFR MDRD Non Af Amer 12/20/2017 60  mL/min/1.73m2 Final   ??? EGFR MDRD Af Amer 12/20/2017 60  mL/min/1.43m2 Final   ??? Albumin 12/20/2017 3.4   Final    LOW   ??? Anion Gap 12/20/2017 17   Final    HIGH   Lab on 12/15/2017   Component Date Value Ref Range Status   ??? WBC 12/15/2017 0.4  10*9/L Final    LOW   ??? RBC 12/15/2017 2.77  10*12/L Final    LOW   ??? HGB 12/15/2017 8.1  g/dL Final    LOW   ??? HCT 12/15/2017 25.4  % Final    LOW   ??? MCV 12/15/2017 91.7  fL Final   ??? MCH 12/15/2017 29.2  pg Final   ??? MCHC 12/15/2017 31.9  g/dL Final    LOW   ??? RDW 12/15/2017 13.2  % Final   ??? MPV 12/15/2017 12.8  fL Final    HIGH   ??? Platelet 12/15/2017 13  10*9/L Final    LOW   ??? nRBC 12/15/2017 0  /100 WBCs Final   ??? Neutrophils % 12/15/2017 2.7  % Final   ??? Lymphocytes % 12/15/2017 86.8  % Final   ??? Monocytes % 12/15/2017 10.5  % Final   ??? Eosinophils % 12/15/2017 0.0  % Final   ??? Basophils % 12/15/2017 0.0  % Final   ??? Absolute Neutrophils 12/15/2017 0.0  10*9/L Final    LOW   ??? Absolute Lymphocytes 12/15/2017 0.3  10*9/L Final    LOW   ??? Absolute Monocytes 12/15/2017 0.0  10*9/L Final    LOW   ??? Absolute Eosinophils 12/15/2017 0.0  10*9/L Final   ??? Absolute Basophils 12/15/2017 0.0  10*9/L Final   ??? Hypochromasia 12/15/2017 Marked* Not Present Final   ??? Lymphs 12/15/2017 88   Final    HIGH   ??? Monocytes Manual 12/15/2017 12   Final   ??? PLT Morphology 12/15/2017 test check   Final    DEC   ??? BNP 12/15/2017 255.1  pg/mL Final   ??? Sodium 12/15/2017 138  mmol/L Final   ??? Potassium 12/15/2017 3.8  mmol/L Final   ??? Chloride 12/15/2017 107  mmol/L Final   ??? CO2 12/15/2017 20.8  mmol/L Final    LOW   ??? BUN 12/15/2017 27  mg/dL Final    HIGH   ??? Creatinine 12/15/2017 0.84  mg/dL Final   ??? Glucose 14/78/2956 108  mg/dL Final    HIGH   ??? Calcium 12/15/2017 9.3  mg/dL Final   ??? Total Protein 12/15/2017 6.1  g/dL Final   ??? Total Bilirubin 12/15/2017 0.6  mg/dL Final   ??? AST 91/47/8295 17  U/L Final   ??? ALT 12/15/2017 24  U/L Final   ??? Alkaline Phosphatase 12/15/2017 113  U/L Final    HIGH   ??? EGFR MDRD Non Af Amer 12/15/2017 60  mL/min/1.92m2 Final   ??? EGFR MDRD Af Amer 12/15/2017 60  mL/min/1.29m2 Final   ??? Albumin 12/15/2017 3.48   Final    LOW ??? Anion Gap 12/15/2017 14   Final   Lab on 12/13/2017   Component Date Value Ref Range Status   ??? WBC 12/13/2017 0.6  10*9/L Final    LOW   ??? RBC 12/13/2017 2.95  10*12/L Final    LOW   ??? HGB 12/13/2017 8.8  g/dL Final    LOW   ??? HCT 12/13/2017 26.9  % Final    LOW   ??? MCV 12/13/2017 91.2  fL Final   ??? MCH 12/13/2017 29.8  pg Final   ??? MCHC 12/13/2017 32.7  g/dL Final   ??? RDW 62/13/0865 13.5  % Final   ??? MPV 12/13/2017 12.9  fL Final    HIGH   ??? Platelet 12/13/2017 20  10*9/L Final    LOW   ??? nRBC 12/13/2017 0  /100 WBCs Final   ??? Neutrophils % 12/13/2017 3.1  % Final   ??? Lymphocytes % 12/13/2017 79.4  % Final   ??? Monocytes % 12/13/2017 17.5  % Final   ??? Eosinophils % 12/13/2017 0.0  % Final   ??? Basophils % 12/13/2017 0.0  % Final   ??? Absolute Neutrophils 12/13/2017 0.0  10*9/L Final    LOW   ??? Absolute Lymphocytes 12/13/2017 0.5  10*9/L Final    LOW   ??? Absolute Monocytes 12/13/2017 0.1  10*9/L Final   ??? Absolute Eosinophils 12/13/2017 0.0  10*9/L Final   ??? Absolute Basophils 12/13/2017 0.0  10*9/L Final   ??? Hypochromasia 12/13/2017 Moderate* Not Present Final   ??? WBC Morphology 12/13/2017 test check   Final    MARKED DECREASE BUFFY COAT SMEAR PERFORMED ON PREVIOUS SPECIMEN   ??? PLT Morphology 12/13/2017 test check   Final    DEC   Infusion on 12/10/2017   Component Date Value Ref Range Status   ??? ABO/Rh Typing 12/10/2017 B POSTIVE   Final   ??? Antibody Screen 12/10/2017 NEGATIVE   Final   ??? 1 Unit Crossmatch 12/10/2017 B POSTIVE   Final    PATIENT RECEIVED 1 UNIT O POSTIVE 12/10/17   Lab on 12/10/2017   Component Date Value Ref Range Status   ??? WBC 12/10/2017 0.5  10*9/L Final    LOW   ??? RBC 12/10/2017 2.67  10*12/L Final    LOW   ??? HGB 12/10/2017 8.0  g/dL Final    LOW   ??? HCT 12/10/2017 23.2  % Final    LOW   ??? MCV 12/10/2017 86.9  fL Final   ??? MCH 12/10/2017 30.0  pg Final   ??? MCHC 12/10/2017 34.5  g/dL Final   ??? RDW 78/46/9629 13.5  % Final   ??? MPV 12/10/2017 11.6  fL Final    HIGH   ??? Platelet 12/10/2017 35 10*9/L Final    LOW   ??? nRBC 12/10/2017 0  /100 WBCs Final   ??? Neutrophils % 12/10/2017 7.4  % Final   ??? Lymphocytes % 12/10/2017 64.8  % Final   ??? Monocytes % 12/10/2017 27.8  % Final   ??? Eosinophils % 12/10/2017  0.0  % Final   ??? Basophils % 12/10/2017 0.0  % Final   ??? Absolute Neutrophils 12/10/2017 0.0  10*9/L Final    LOW   ??? Absolute Lymphocytes 12/10/2017 0.4  10*9/L Final   ??? Absolute Monocytes 12/10/2017 0.2  10*9/L Final   ??? Absolute Eosinophils 12/10/2017 0.0  10*9/L Final   ??? Absolute Basophils 12/10/2017 0.0  10*9/L Final   ??? Sodium 12/10/2017 139  mmol/L Final   ??? Potassium 12/10/2017 4.3  mmol/L Final   ??? Chloride 12/10/2017 106  mmol/L Final   ??? CO2 12/10/2017 23.3  mmol/L Final   ??? BUN 12/10/2017 24  mg/dL Final    HIGH   ??? Creatinine 12/10/2017 0.83  mg/dL Final   ??? Glucose 16/08/9603 109  mg/dL Final    HIGH   ??? Calcium 12/10/2017 9.1  mg/dL Final   ??? Total Protein 12/10/2017 6.0  g/dL Final   ??? Total Bilirubin 12/10/2017 0.8  mg/dL Final   ??? AST 54/07/8118 19  U/L Final   ??? ALT 12/10/2017 34  U/L Final   ??? Alkaline Phosphatase 12/10/2017 129  U/L Final    HIGH   ??? EGFR MDRD Non Af Amer 12/10/2017 60  mL/min/1.60m2 Final   ??? EGFR MDRD Af Amer 12/10/2017 60  mL/min/1.72m2 Final   ??? Albumin 12/10/2017 3.27   Final    LOW   ??? Anion Gap 12/10/2017 15   Final   Hospital Outpatient Visit on 12/09/2017   Component Date Value Ref Range Status   ??? Platelet 12/09/2017 32* 150 - 440 10*9/L Final   ??? Unit Blood Type 12/10/2017 A Pos   Final   ??? ISBT Number 12/10/2017 6200   Final   ??? Unit # 12/10/2017 [id number]   Final   ??? Status 12/10/2017 Transfused   Final   ??? Product ID 12/10/2017 Platelets   Final   ??? PRODUCT CODE 12/10/2017 [id number]   Final   ??? Crossmatch 12/13/2017 Compatible   Final   ??? Unit Blood Type 12/13/2017 B Pos   Final   ??? ISBT Number 12/13/2017 7300   Final   ??? Unit # 12/13/2017 [id number]   Final   ??? Status 12/13/2017 Transfused   Final   ??? Product ID 12/13/2017 Red Blood Cells   Final ??? PRODUCT CODE 12/13/2017 [id number]   Final   ??? Crossmatch 12/13/2017 Compatible   Final   ??? Unit Blood Type 12/13/2017 B Pos   Final   ??? ISBT Number 12/13/2017 7300   Final   ??? Unit # 12/13/2017 [id number]   Final   ??? Status 12/13/2017 Released to [employer]   Final   ??? Spec Expiration 12/13/2017 [id number]   Final   ??? Product ID 12/13/2017 Red Blood Cells   Final   ??? PRODUCT CODE 12/13/2017 [id number]   Final   Office Visit on 12/09/2017   Component Date Value Ref Range Status   ??? Antibody Screen 12/09/2017 NEG   Final   ??? ABO Grouping 12/09/2017 Invalid   Final   ??? Blood Type 12/09/2017 B POS   Final   There may be more visits with results that are not included.        IMAGING STUDIES  No current imaging results    The total time spent discussing the previous history, imaging studies, laboratory studies, the role and rationale of AML therapy, palliative care and discussion was 30 minutes.  At least 50% of that time was spent in  answering questions and counseling.    FOLLOW UP: AS DIRECTED       Ccc:

## 2017-12-24 DIAGNOSIS — Z955 Presence of coronary angioplasty implant and graft: Secondary | ICD-10-CM | POA: Diagnosis not present

## 2017-12-24 DIAGNOSIS — I2510 Atherosclerotic heart disease of native coronary artery without angina pectoris: Secondary | ICD-10-CM | POA: Diagnosis not present

## 2017-12-24 DIAGNOSIS — R627 Adult failure to thrive: Secondary | ICD-10-CM | POA: Diagnosis not present

## 2017-12-24 DIAGNOSIS — R1310 Dysphagia, unspecified: Secondary | ICD-10-CM | POA: Diagnosis not present

## 2017-12-24 DIAGNOSIS — I252 Old myocardial infarction: Secondary | ICD-10-CM | POA: Diagnosis not present

## 2017-12-24 DIAGNOSIS — C9210 Chronic myeloid leukemia, BCR/ABL-positive, not having achieved remission: Secondary | ICD-10-CM | POA: Diagnosis not present

## 2017-12-24 DIAGNOSIS — H61329 Acquired stenosis of external ear canal secondary to inflammation and infection, unspecified ear: Secondary | ICD-10-CM | POA: Diagnosis not present

## 2017-12-24 DIAGNOSIS — K219 Gastro-esophageal reflux disease without esophagitis: Secondary | ICD-10-CM | POA: Diagnosis not present

## 2017-12-24 DIAGNOSIS — D6189 Other specified aplastic anemias and other bone marrow failure syndromes: Secondary | ICD-10-CM | POA: Diagnosis not present

## 2017-12-24 DIAGNOSIS — I714 Abdominal aortic aneurysm, without rupture: Secondary | ICD-10-CM | POA: Diagnosis not present

## 2017-12-24 DIAGNOSIS — I10 Essential (primary) hypertension: Secondary | ICD-10-CM | POA: Diagnosis not present

## 2017-12-24 NOTE — Unmapped (Signed)
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  You are still recovering from the effects of latest chemotherapy.  We will continue support you with transfusions of packed red blood cells and platelets.  I will see her again in follow-up next week.

## 2017-12-24 NOTE — Unmapped (Signed)
Patient arrived to clinic in wheelchair.  Vital signs obtained.  Premedication given as ordered for chemo injection and blood transfusion.  IV started to right arm and connected to NS. Patient received injections as ordered in divided doses. Patient received platelets and blood transfusion as ordered.  Patient tolerated well without complaint.  Patient d/c to home and aware of return appointment.

## 2017-12-27 ENCOUNTER — Other Ambulatory Visit: Admit: 2017-12-27 | Discharge: 2017-12-28 | Payer: PRIVATE HEALTH INSURANCE

## 2017-12-27 DIAGNOSIS — C9300 Acute monoblastic/monocytic leukemia, not having achieved remission: Secondary | ICD-10-CM | POA: Diagnosis not present

## 2017-12-27 LAB — CBC W/ DIFFERENTIAL
BASOPHILS ABSOLUTE COUNT: 0 10*9/L
BASOPHILS RELATIVE PERCENT: 0 %
EOSINOPHILS ABSOLUTE COUNT: 0 10*9/L
EOSINOPHILS RELATIVE PERCENT: 0 %
HEMATOCRIT: 30.2 %
HEMOGLOBIN: 10.2 g/dL
LYMPHOCYTES ABSOLUTE COUNT: 0.3 10*9/L
LYMPHOCYTES RELATIVE PERCENT: 96.9 %
MEAN CORPUSCULAR HEMOGLOBIN: 30.1 pg
MEAN PLATELET VOLUME: 11.3 fL
MONOCYTES ABSOLUTE COUNT: 0 10*9/L
MONOCYTES RELATIVE PERCENT: 0 %
NEUTROPHILS ABSOLUTE COUNT: 0 10*9/L
NEUTROPHILS RELATIVE PERCENT: 3.1 %
NUCLEATED RED BLOOD CELLS: 0 /100{WBCs}
PLATELET COUNT: 11 10*9/L
RED BLOOD CELL COUNT: 3.39 10*12/L
RED CELL DISTRIBUTION WIDTH: 13.1 %
WBC ADJUSTED: 0.3 10*9/L

## 2017-12-27 LAB — NUCLEATED RED BLOOD CELLS: Lab: 0

## 2017-12-28 DIAGNOSIS — K219 Gastro-esophageal reflux disease without esophagitis: Secondary | ICD-10-CM | POA: Diagnosis not present

## 2017-12-28 DIAGNOSIS — I2510 Atherosclerotic heart disease of native coronary artery without angina pectoris: Secondary | ICD-10-CM | POA: Diagnosis not present

## 2017-12-28 DIAGNOSIS — H61329 Acquired stenosis of external ear canal secondary to inflammation and infection, unspecified ear: Secondary | ICD-10-CM | POA: Diagnosis not present

## 2017-12-28 DIAGNOSIS — C9210 Chronic myeloid leukemia, BCR/ABL-positive, not having achieved remission: Secondary | ICD-10-CM | POA: Diagnosis not present

## 2017-12-28 DIAGNOSIS — R627 Adult failure to thrive: Secondary | ICD-10-CM | POA: Diagnosis not present

## 2017-12-28 DIAGNOSIS — Z955 Presence of coronary angioplasty implant and graft: Secondary | ICD-10-CM | POA: Diagnosis not present

## 2017-12-28 DIAGNOSIS — R1310 Dysphagia, unspecified: Secondary | ICD-10-CM | POA: Diagnosis not present

## 2017-12-28 DIAGNOSIS — D6189 Other specified aplastic anemias and other bone marrow failure syndromes: Secondary | ICD-10-CM | POA: Diagnosis not present

## 2017-12-28 DIAGNOSIS — I10 Essential (primary) hypertension: Secondary | ICD-10-CM | POA: Diagnosis not present

## 2017-12-28 DIAGNOSIS — I714 Abdominal aortic aneurysm, without rupture: Secondary | ICD-10-CM | POA: Diagnosis not present

## 2017-12-28 DIAGNOSIS — I252 Old myocardial infarction: Secondary | ICD-10-CM | POA: Diagnosis not present

## 2017-12-29 DIAGNOSIS — I714 Abdominal aortic aneurysm, without rupture: Secondary | ICD-10-CM | POA: Diagnosis not present

## 2017-12-29 DIAGNOSIS — R627 Adult failure to thrive: Secondary | ICD-10-CM | POA: Diagnosis not present

## 2017-12-29 DIAGNOSIS — I252 Old myocardial infarction: Secondary | ICD-10-CM | POA: Diagnosis not present

## 2017-12-29 DIAGNOSIS — C9210 Chronic myeloid leukemia, BCR/ABL-positive, not having achieved remission: Secondary | ICD-10-CM | POA: Diagnosis not present

## 2017-12-29 DIAGNOSIS — I2510 Atherosclerotic heart disease of native coronary artery without angina pectoris: Secondary | ICD-10-CM | POA: Diagnosis not present

## 2017-12-29 DIAGNOSIS — D6189 Other specified aplastic anemias and other bone marrow failure syndromes: Secondary | ICD-10-CM | POA: Diagnosis not present

## 2017-12-29 DIAGNOSIS — I10 Essential (primary) hypertension: Secondary | ICD-10-CM | POA: Diagnosis not present

## 2017-12-29 DIAGNOSIS — R1310 Dysphagia, unspecified: Secondary | ICD-10-CM | POA: Diagnosis not present

## 2017-12-29 DIAGNOSIS — H61329 Acquired stenosis of external ear canal secondary to inflammation and infection, unspecified ear: Secondary | ICD-10-CM | POA: Diagnosis not present

## 2017-12-29 DIAGNOSIS — K219 Gastro-esophageal reflux disease without esophagitis: Secondary | ICD-10-CM | POA: Diagnosis not present

## 2017-12-29 DIAGNOSIS — Z955 Presence of coronary angioplasty implant and graft: Secondary | ICD-10-CM | POA: Diagnosis not present

## 2017-12-30 ENCOUNTER — Encounter: Admit: 2017-12-30 | Discharge: 2017-12-30 | Payer: PRIVATE HEALTH INSURANCE

## 2017-12-30 ENCOUNTER — Ambulatory Visit: Admit: 2017-12-30 | Discharge: 2017-12-30 | Payer: PRIVATE HEALTH INSURANCE

## 2017-12-30 DIAGNOSIS — Z515 Encounter for palliative care: Secondary | ICD-10-CM | POA: Diagnosis not present

## 2017-12-30 DIAGNOSIS — C9200 Acute myeloblastic leukemia, not having achieved remission: Secondary | ICD-10-CM | POA: Diagnosis not present

## 2017-12-30 DIAGNOSIS — I2510 Atherosclerotic heart disease of native coronary artery without angina pectoris: Secondary | ICD-10-CM | POA: Diagnosis not present

## 2017-12-30 DIAGNOSIS — K219 Gastro-esophageal reflux disease without esophagitis: Secondary | ICD-10-CM | POA: Diagnosis not present

## 2017-12-30 DIAGNOSIS — Z955 Presence of coronary angioplasty implant and graft: Secondary | ICD-10-CM | POA: Diagnosis not present

## 2017-12-30 DIAGNOSIS — I714 Abdominal aortic aneurysm, without rupture: Secondary | ICD-10-CM | POA: Diagnosis not present

## 2017-12-30 DIAGNOSIS — I10 Essential (primary) hypertension: Secondary | ICD-10-CM | POA: Diagnosis not present

## 2017-12-30 DIAGNOSIS — R627 Adult failure to thrive: Secondary | ICD-10-CM | POA: Diagnosis not present

## 2017-12-30 DIAGNOSIS — I252 Old myocardial infarction: Secondary | ICD-10-CM | POA: Diagnosis not present

## 2017-12-30 DIAGNOSIS — R1310 Dysphagia, unspecified: Secondary | ICD-10-CM | POA: Diagnosis not present

## 2017-12-30 DIAGNOSIS — H61329 Acquired stenosis of external ear canal secondary to inflammation and infection, unspecified ear: Secondary | ICD-10-CM | POA: Diagnosis not present

## 2017-12-30 DIAGNOSIS — D6189 Other specified aplastic anemias and other bone marrow failure syndromes: Secondary | ICD-10-CM | POA: Diagnosis not present

## 2017-12-30 DIAGNOSIS — C9210 Chronic myeloid leukemia, BCR/ABL-positive, not having achieved remission: Secondary | ICD-10-CM | POA: Diagnosis not present

## 2017-12-30 DIAGNOSIS — C9300 Acute monoblastic/monocytic leukemia, not having achieved remission: Secondary | ICD-10-CM | POA: Diagnosis not present

## 2017-12-30 LAB — COMPREHENSIVE METABOLIC PANEL
ALBUMIN: 3.2
ALKALINE PHOSPHATASE: 119 U/L
ALT (SGPT): 20 U/L
ANION GAP: 19
AST (SGOT): 12 U/L
BILIRUBIN TOTAL: 2.1 mg/dL
BLOOD UREA NITROGEN: 30 mg/dL
CALCIUM: 9.5 mg/dL
CHLORIDE: 100 mmol/L
CO2: 18.1 mmol/L
CREATININE: 0.84 mg/dL
GLUCOSE RANDOM: 160 mg/dL
POTASSIUM: 3.8 mmol/L
PROTEIN TOTAL: 6.8 g/dL
SODIUM: 133 mmol/L

## 2017-12-30 LAB — CBC W/ DIFFERENTIAL
BASOPHILS ABSOLUTE COUNT: 0 10*9/L
BASOPHILS RELATIVE PERCENT: 0 %
EOSINOPHILS ABSOLUTE COUNT: 0 10*9/L
EOSINOPHILS RELATIVE PERCENT: 0 %
HEMATOCRIT: 26.2 %
HEMOGLOBIN: 8.8 g/dL
LYMPHOCYTES ABSOLUTE COUNT: 0.2 10*9/L
LYMPHOCYTES RELATIVE PERCENT: 95.2 %
MEAN CORPUSCULAR HEMOGLOBIN: 29.7 pg
MEAN CORPUSCULAR VOLUME: 88.5 fL
MONOCYTES ABSOLUTE COUNT: 0 10*9/L
MONOCYTES RELATIVE PERCENT: 0 %
NEUTROPHILS RELATIVE PERCENT: 4.8 %
NUCLEATED RED BLOOD CELLS: 0 /100{WBCs}
PLATELET COUNT: 4 10*9/L
RED BLOOD CELL COUNT: 2.96 10*12/L
RED CELL DISTRIBUTION WIDTH: 12.5 %
WHITE BLOOD CELL COUNT: 0.2 10*9/L

## 2017-12-30 LAB — HEMATOCRIT: Lab: 26.2

## 2017-12-30 LAB — ALT (SGPT): Lab: 20

## 2017-12-30 MED ORDER — MORPHINE CONCENTRATE 100 MG/5 ML (20 MG/ML) ORAL SOLUTION
ORAL | 0 refills | 0 days | Status: CP | PRN
Start: 2017-12-30 — End: 2018-01-13

## 2017-12-30 MED ORDER — MAGIC MOUTHWASH WITH LIDOCAINE ORAL SUSPENSION
Freq: Four times a day (QID) | ORAL | 3 refills | 0 days | Status: CP
Start: 2017-12-30 — End: ?

## 2017-12-30 NOTE — Unmapped (Signed)
??  [facility], [facility]   Hematology Oncology Return Visit   DATE OF SERVICE  12/30/2017     REFERRING PROVIDER   Richardean Chimera, Md  [address] [place], [place] [address]    PRIMARY CARE PROVIDER  Richardean Chimera, MD  [address] 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    CANCER HISTORY  Oncology History    Diagnosis: AML    Presentation: Pancytopenia    Bone marrow biopsy: 09/14/2017  Hypercellular (>95%) marrow with approximately 40% blasts  Dysmegakaryopoiesis  Aspirate- predominance of blasts- no manual aspirate differential given    Flow- 39% blasts- CD34+ (subset), CD13, CD15, CD7, CD11c ,CD33, CD117, HLA-DR    Cytogenetics: Normal    Molecular Mutations: Not done    Treatment:  Azacitidine 75 mg/m2 SQ days 1-7  Cycle 1: 11/07    C/b multiple infections and hospitalizations.    Myeloid Panel from [facility]:  BCOR: c.2124delC (p.Gly709AlafsTer)  FLT3: c.2505T>G (p.Asp835Glu) [FLT3-TKD]  NPM1: c.860_863dupTCTG (p.Trp288CysfsTer12) [Type A Mutation]  WT1: c.1144delG (p.Ala382HisfsTer67)    Cycle 2: Azacitidine  - 11/12/2017    Venetoclax initiated on 11/19/2017      Final Diagnosis   Date Value Ref Range Status   12/03/2017   Final    Bone marrow, right iliac, aspiration and biopsy  -  Hypercellular bone marrow (80%) with persistent involvement by acute myeloid leukemia (32% blasts by manual aspirate differentiation).  -  Cytogenetic studies are pending.  -  Flow cytometric MRD analysis is pending.  -  NPM1 quantitation study is pending.       Flow MRD = Undetectable        Acute myeloid leukemia (AML), M5 (CMS-HCC)    09/17/2017 Initial Diagnosis     Acute myeloid leukemia (AML), M5 (CMS-HCC)          CANCER STAGING  Cancer Staging  No matching staging information was found for the patient.    CURRENT HISTORY  He restarted Azacitadine 75 mg/m2 sq x 7 days on December 15 2017.  Patient developed mouth sores a few weeks ago which has become progressively worse.  He is using magic mouthwash with limited relief.  He is spending most of his time sleeping in the recliner.  Patient had a temperature of 99 F one afternoon which responded to tylenol.  Eating 3 meals a day and snacks with help of Megace but does better with soft foods.  Continues to have SOB at rest.   Wife states that due to the patient's increasing weakness, transporting him to and from appointments has become exceedingly difficult.  She is not certain how she will get him back in the house today.         ASSESSMENT    72 year old male with history of coronary artery disease, MI, GERD, colon polyps, tobacco dependence and overall poor performance status.    Patient has AML with normal karyotype however myeloid panel from peripheral blood revealed mutations in NPM1, FLT3-D835, BCOR, and WT1.  Patient has been treated with a cycle of cytarabine/ venetoclax followed by venetoclax.  Course has also been complicated by persistent cytopenias.      RECOMMENDATION/PLAN  AML with with normal karyotype but with mutations in NPM1, FLT3-D835, BCOR, and WT1:  Patient has never achieved remission.  Course of therapy has been comp located by persistent cytopenias.  Bone marrow biopsy obtained on December 03, 2017 shows a considerable amount of residual disease.  Patient began his latest cycle of palliative azacitadine on December 15, 2017.  His performance status has continued to decline with chemotherapy and the cytopenias are no better.    Possible otitis: Completed the course of Augmentin 875 mg po bid x 14 days on December 15 2017.        Transfusion support:  Transfuse 2 units of packed red blood cells for hemoglobin less than 8 and 1 unit of PLT's if  PLT count is less than 10 and/or actively bleeding.  Will check laboratories twice weekly at least so that he may be adequately transfused.  His platelet count today is 4 we will arrange for a platelet transfusion tomorrow.  He is forcing not having any bleeding issues.    Infection prophylaxis: Currently on Levaquin, posaconazole, and acyclovir for prophylaxis against bacterial, fungal and HSV infections respectively    Goals of care: Patient and his wife understand that the goal of therapy is palliative.      Shortness of breath:  BNP on January 30th 2019 does not suggest a cardiac cause.  The normal RA O2 sat does not suggest a pulmonary cause.  Marland Kitchen      Poor performance status: Likely multifactorial but the main culprit is likely his underlying malignancy.  Declining, performance status is 3 at best likely 4 is more realistic description.    Prognosis: Patient, his wife and I discussed his overall prognosis which is poor and clearly measurable and weeks to may be a few months.  Patient meets criteria for hospice.  He does not feel that his quality of life is improving on chemotherapy.  He is clearly so weak that doctors visits are challenging for him and his wife and in fact may not be beneficial.  To that end I recommended hospice evaluation which they are open to.  At this time he wishes to continue supportive care.  We have previously discussed advanced directives and he wishes to be DNR.  He understands that he is not a candidate for myelo ablative chemotherapy or bone marrow transplant.    HISTORY OF PRESENT ILLNESS   Arthur Bates is a 72 y.o. 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  He had associated with this extreme fatigue, generalized weakness, malaise, and near nightly damp sweats.  ??  On September 14, 2017 a bone marrow aspiration and biopsy was performed at [facility]. 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 Correlation with cytogenetics and fluorescent in situ hybridization was requested.  Evaluation by [facility] revealed  a (46, XY) karyotype.  DNA probe specific molecular analysis failed to reveal any cytogenetic abnormality on chromosome 8 to rule out the presence of trisomy 8.  Unfortunately further molecular profiling was not performed although requested by Dr. Donnie Coffin.  Molecular studies  for certain abnormalities, such as mutations in FLT3, nucleophosmin (NPM1), KIT, CEBPA, or RUNX1 as well as gene expression profiles confer prognostic significance in adults with AML. AML with mutations of NPM1, RUNX1, biallelic CEBPA, PML-RARA, and others are included as specific entities in the World Health Organization classification.    ??  On September 22, 2017 he was started on cycle 1 of single agent azacitidine once daily for 7 days out of 28.  With the exception of hematologic toxicity in the form of cytopenias, overall tolerance had been acceptable.  ??  On October 04, 2017 Patient was admitted to [facility] for evaluation and management of extreme fatigue.  While hospitalized he received IVF, infectious workup and evaluation; empiric antimicrobial therapy pending the results of pancultures, 2 view chest x-ray, EKG and echocardiogram, troponin levels, and supportive care.  After 2 hospital days, he was discharged home.     ??On October 24, 2017 he was admitted with high fever (102-103) to the service of Dr. Donzetta Sprung, his primary care physician.  In addition to anemia and thrombocytopenia, he felt weak and tired.  Pancultures were obtained and he was started on empiric antibiotic therapy with vancomycin and Zosyn.  Several will sets of blood cultures were negative.  He was transfused 2 units of packed red blood cells.  At the time of his discharge on December 14, he was afebrile.  While hospitalized, his white blood count had risen to 25-30K with blasts in his peripheral blood.  His fever was felt to be due to his underlying leukemic process.  ??  Cycle 2 of azacitidine was scheduled to begin on November 01 2017.  That treatment was deferred due to hospitalization and fever workup and empiric antibiotics which failed to reveal any evidence of infection.    ??  Because of his deteriorating laboratory and clinical condition, he was admitted to [facility] on November 11, 2017.  Under the direction of Dr. Cristal Deer Dittus, he was started on hydroxyurea, then transitioned to azacitidine and venetoclax beginning November 11, 2017.  Azacitidine was completed on November 18, 2017. This represented Cycle 2 of chemotherapy.  Though treated with cefepime from December 31 to January 4, then transition to levofloxacin at the time of discharge, his cultures were persistently negative.CT imaging of the chest without intravenous contrast was performed on December 31 at [facility] revealed bibasilar subpleural opacities consistent with fibrosis.  No discrete infiltrate or consolidation was identified. His anorexia, and nutritional status and functional status were poor necessitating admission to [facility].      Patient underwent bone marrow bx and aspirate performed at [facility] on 12/03/2017.   Pathology from this revealed a hypercellular bone marrow (80%) with persistent involvement by acute myeloid leukemia (32% blasts by manual aspirate differentiation).  Cytogenetic studies, flow cytometric MRD analysis and NPM1 quantitation study are pending.     The patient was seen at [facility] on 12/09/17 by Dr. Artemio Aly in follow up and it was recommended that Azacitadine be continued despite the cytopenias.  He received a 1 of 2 planned units of PRBC's and a unit of PLT's yesterday on that day due to time constraints.  He receied a second unit of PRBC's the following day.  He was discharged from the [facility] facility to home on December 11 2017     Restarted Azacitadine 75 mg/m2 sq x 7 days on December 15 2017.  BNP on 12/15/17 was 255.1 pg/mL  Patient received 2 units PRBC's  and PLT's on December 23 2017.       ??  ??PAST MEDICAL HISTORY  Past Medical History:   Diagnosis Date   ??? Coronary artery disease    ??? Depression    ??? Dissecting aortic aneurysm (any part), abdominal (CMS-HCC)    ??? ED (erectile dysfunction)    ??? GERD (gastroesophageal reflux disease)    ??? HTN (hypertension)    ??? Hyperlipidemia    ??? Ischemic heart disease    ??? Osteoarthritis of knee        SURGICAL HISTORY  Past Surgical History:   Procedure Laterality Date   ??? CARDIAC CATHETERIZATION     ??? COLONOSCOPY  2009   ??? CORONARY ANGIOPLASTY WITH STENT PLACEMENT     ??? KNEE SURGERY     ??? TOTAL KNEE ARTHROPLASTY         ALLERGIES:  Allergies   Allergen Reactions   ??? Ace Inhibitors Cough   ??? Shellfish Containing Products Nausea And Vomiting   ??? Zithromax [Azithromycin] Rash       MEDICATIONS:    Current Outpatient Prescriptions:   ???  acyclovir (ZOVIRAX) 400 MG tablet, Take 400 mg by mouth Two (2) times a day., Disp: , Rfl:   ???  allopurinol (ZYLOPRIM) 300 MG tablet, Take 1 tablet (300 mg total) by mouth daily., Disp: 30 tablet, Rfl: 0  ???  levoFLOXacin (LEVAQUIN) 500 MG tablet, Take 500 mg by mouth daily., Disp: , Rfl:   ???  megestrol (MEGACE) 400 mg/10 mL (40 mg/mL) suspension, TAKE BY MOUTH TWICE DAILY, Disp: , Rfl: 0  ???  polyethylene glycol (MIRALAX) 17 gram packet, MIX 1 PACKET OF POWDER (17 GM) IN 8 OZ OF JUICE OR WATER AND DRINK BY MOUTH DAILY, Disp: , Rfl: 6  ???  posaconazole (NOXAFIL) 100 mg TbEC delayed released tablet, Take 300 mg by mouth daily., Disp: 90 tablet, Rfl: 2  ???  venetoclax (VENCLEXTA) 100 mg tablet, Take 1 tablet (100 mg total) by mouth daily with evening meal. Take with a meal and water. Do not chew, crush, or break tablets., Disp: 120 tablet, Rfl: 6  ???  diphenhydramine HCl (MAGIC MOUTHWASH WITH LIDOCAINE ORAL) suspension, Take 10 mL by mouth Four (4) times a day. (Patient not taking: Reported on 12/30/2017), Disp: 240 mL, Rfl: 3  ???  famotidine (PEPCID) 20 MG tablet, Take 1 tablet (20 mg total) by mouth Two (2) times a day., Disp: 60 tablet, Rfl: 0  ???  mirtazapine (REMERON) 15 MG tablet, Take 1 tablet (15 mg total) by mouth nightly., Disp: 90 tablet, Rfl: 3  ???  ondansetron (ZOFRAN) 8 MG tablet, Take 1 tablet (8 mg total) by mouth every twelve (12) hours as needed for nausea. (Patient not taking: Reported on 12/30/2017), Disp: 20 tablet, Rfl: 0       REVIEW OF SYSTEMS  Constitutional: No fever, sweats, or shaking chills. No weight changes since last visit,  ECOG Status is 3 at best  HEENT: No visual changes.  No changes in voice.  Has mouth sores.     Pulmonary: No unusual cough, sore throat, or orthopnea. SOB on standing up.    Cardiovascular: Has history of coronary artery disease and MI.  No angina, or palpitations.     Gastrointestinal: Has nausea but no vomiting, dysphagia, odynophagia, abdominal pain, diarrhea, or constipation. No change in bowel habits.   Genitourinary: No frequency, urgency, hematuria, or dysuria.   Musculoskeletal: No arthralgias  or myalgias; no back pain;  no joint swelling, pain or instability.   Hematologic: No bleeding tendency or easy bruisability.   Endocrine: Intolerance to cold; no thyroid disease or diabetes mellitus.   Skin: No rash, scaling, sores, lumps, or jaundice.  Vascular: No peripheral arterial or venous thromboembolic disease.   Psychological: No anxiety, depression, or mood changes; no mental health illnesses.   Neurological: Has dizziness, lightheadedness.  No syncope, or near syncopal episodes; no numbness or tingling in the fingers or toes.     PHYSICAL EXAMINATION  There were no vitals taken for this visit.   Reviewed vitals taken by [facility] staff.    General:   Fatigued.  Here with wife.  Frail appearing, seated in wheelchair.  Shakes periodically   Eyes:   Pupil equal round reacting to light and accomodation.  Extra occular muscles intact, and sclera clear and without icteris.   ENT:   Oropharynx without mucositis, or thrush.  Mucosa dry. Lower lip with healing sore     Neck:   Supple without any enlargement, no thyromegaly, bruit, or jugular venous distention.   Lymph Nodes:  No adenopathy (cervical, supraclavicular, axillary, inguinal)   Cardiovascular:  RRR, normal S1, S2 without murmur, rub, or gallop.  Pulses 2+ equal on both sides without any bruits.   Lungs:  Clear to auscultation bilaterally, without wheezes/crackles/rhonchi.  Good air movement.   Skin:    No rashes, jaundice, breakdown   Psychiatry:   Alert and oriented to person, place, and time    Abdomen:   Normoactive bowel sounds, abdomen soft, non-tender and not distended, no Hepatosplenomegaly or masses.  Liver normal in size, no rebound or guarding.    Extremities:   No bilateral cyanosis, clubbing or edema.  No rash, lesions, or petechiae.   Musculo Skeletal:   No joint tenderness, deformity, effusions.  No spine or costovertebral angle tenderness.  Full range of motion in shoulder, elbow, hip, knee, ankle, hands and feet.  Decreased muscle mass.   Neurological:  Alert and oriented to person, place and time.  Cranial nerves II-XII grossly intact, unsteady gait, normal sensation throughout, normal cerebellar function.           LABORATORY STUDIES  Lab on 12/27/2017   Component Date Value Ref Range Status   ??? WBC 12/27/2017 0.3  10*9/L Final    LOW   ??? RBC 12/27/2017 3.39  10*12/L Final    LOW   ??? HGB 12/27/2017 10.2  g/dL Final    LOW   ??? HCT 12/27/2017 30.2  % Final    LOW   ??? MCV 12/27/2017 89.1  fL Final   ??? MCH 12/27/2017 30.1  pg Final   ??? MCHC 12/27/2017 33.8  g/dL Final   ??? RDW 16/08/9603 13.1  % Final   ??? MPV 12/27/2017 11.3  fL Final    HIGH   ??? Platelet 12/27/2017 11  10*9/L Final    LOW   ??? nRBC 12/27/2017 0  /100 WBCs Final   ??? Neutrophils % 12/27/2017 3.1  % Final   ??? Lymphocytes % 12/27/2017 96.9  % Final   ??? Monocytes % 12/27/2017 0.0  % Final   ??? Eosinophils % 12/27/2017 0.0  % Final   ??? Basophils % 12/27/2017 0.0  % Final   ??? Absolute Neutrophils 12/27/2017 0.0  10*9/L Final    LOW   ??? Absolute Lymphocytes 12/27/2017 0.3  10*9/L Final    LOW   ??? Absolute Monocytes  12/27/2017 0.0  10*9/L Final    LOW   ??? Absolute Eosinophils 12/27/2017 0.0  10*9/L Final   ??? Absolute Basophils 12/27/2017 0.0  10*9/L Final   Infusion on 12/23/2017   Component Date Value Ref Range Status   ??? ABO/Rh Typing 12/22/2017 B POSTIVE   Final   ??? Antibody ID 12/22/2017 NOT DONE   Final   ??? Antibody Screen 12/22/2017 NEGATIVE   Final   ??? 2 Unit Crossmatch 12/23/2017 GAVE 2 UNITS   Final    PATINET RECIEVED 2 UNITS OF BLOOD 12/23/17 1ST UNIT WAS O NEG 2ND UNIT WAS O POSTIVE   ??? Platelet Product 12/23/2017 B POSTIVE   Final    PATIENT RECIEVED 1 UNIT PLT PHERESIS 12/23/17   Lab on 12/22/2017   Component Date Value Ref Range Status   ??? WBC 12/22/2017 0.3  10*9/L Final    LOW   ??? RBC 12/22/2017 2.73  10*12/L Final    LOW   ??? HGB 12/22/2017 8.0  g/dL Final    LOW   ??? HCT 12/22/2017 24.8  % Final    LOW   ??? MCV 12/22/2017 90.8  fL Final   ??? MCH 12/22/2017 29.3  pg Final   ??? MCHC 12/22/2017 32.3  g/dL Final   ??? RDW 16/08/9603 13.0  % Final   ??? MPV 12/22/2017 10.3  fL Final   ??? Platelet 12/22/2017 9  10*9/L Final    LOW   ??? nRBC 12/22/2017 0  /100 WBCs Final   ??? Neutrophils % 12/22/2017 6.0  % Final   ??? Lymphocytes % 12/22/2017 87.9  % Final   ??? Monocytes % 12/22/2017 6.1  % Final   ??? Eosinophils % 12/22/2017 0.0  % Final   ??? Basophils % 12/22/2017 0.0  % Final   ??? Absolute Neutrophils 12/22/2017 0.0  10*9/L Final    LOW   ??? Absolute Lymphocytes 12/22/2017 0.3  10*9/L Final    LOW   ??? Absolute Monocytes 12/22/2017 0.0  10*9/L Final    LOW   ??? Absolute Eosinophils 12/22/2017 0.0  10*9/L Final   ??? Absolute Basophils 12/22/2017 0.0  10*9/L Final   ??? Microcytosis 12/22/2017 Marked* Not Present Final   ??? Anisocytosis 12/22/2017 Marked* Not Present Final   ??? Hypochromasia 12/22/2017 Moderate* Not Present Final   ??? Poikilocytosis 12/22/2017 Occasional   Final   ??? WBC Morphology 12/22/2017 test check   Final    MARKED LEUKOCYTOPENIA NOTED, MARKED LYMPHOCYTOPENIA NOTED, DECRESED NEUTROPHILIA NOTED   ??? Sodium 12/22/2017 135  mmol/L Final    LOW   ??? Potassium 12/22/2017 4.6  mmol/L Final   ??? Chloride 12/22/2017 106  mmol/L Final   ??? CO2 12/22/2017 19.4  mmol/L Final    LOW   ??? BUN 12/22/2017 29  mg/dL Final    HIGH   ??? Creatinine 12/22/2017 1.17  mg/dL Final   ??? Glucose 54/07/8118 110  mg/dL Final    HIGH   ??? Calcium 12/22/2017 9.4  mg/dL Final   ??? Total Protein 12/22/2017 6.4  g/dL Final   ??? Total Bilirubin 12/22/2017 0.8  mg/dL Final   ??? AST 14/78/2956 15  U/L Final   ??? ALT 12/22/2017 26  U/L Final   ??? Alkaline Phosphatase 12/22/2017 113  U/L Final    HIGH   ??? EGFR MDRD Non Af Amer 12/22/2017 60  mL/min/1.39m2 Final   ??? EGFR MDRD Af Amer 12/22/2017 60  mL/min/1.24m2 Final   ???  Albumin 12/22/2017 3.45   Final    LOW   ??? Anion Gap 12/22/2017 14   Final   Lab on 12/20/2017   Component Date Value Ref Range Status   ??? WBC 12/20/2017 0.5  10*9/L Final    LOW   ??? RBC 12/20/2017 2.83  10*12/L Final    LOW   ??? HGB 12/20/2017 8.4  g/dL Final    LOW   ??? HCT 12/20/2017 25.5  % Final    LOW   ??? MCV 12/20/2017 90.1  fL Final   ??? MCH 12/20/2017 29.7  pg Final   ??? MCHC 12/20/2017 32.9  g/dL Final   ??? RDW 11/91/4782 13.2  % Final   ??? MPV 12/20/2017 12.7  fL Final    HIGH   ??? Platelet 12/20/2017 22  10*9/L Final    LOW   ??? nRBC 12/20/2017 0  /100 WBCs Final   ??? Neutrophils % 12/20/2017 2.0  % Final   ??? Lymphocytes % 12/20/2017 92.0  % Final   ??? Monocytes % 12/20/2017 6.0 % Final   ??? Eosinophils % 12/20/2017 0.0  % Final   ??? Basophils % 12/20/2017 0.0  % Final   ??? Absolute Neutrophils 12/20/2017 0.0  10*9/L Final    LOW   ??? Absolute Lymphocytes 12/20/2017 0.5  10*9/L Final    LOW   ??? Absolute Monocytes 12/20/2017 0.0  10*9/L Final    LOW   ??? Absolute Eosinophils 12/20/2017 0.0  10*9/L Final   ??? Absolute Basophils 12/20/2017 0.0  10*9/L Final   Infusion on 12/17/2017   Component Date Value Ref Range Status   ??? 1 Unit Crossmatch 12/16/2017 O POSITVE   Final    1 UNIT O POSTIVE WAS GIVEN 12/17/17   ??? Platelets 12/17/2017 B POSTIVE   Final    1 UNIT B POSTIVE PLATELETS   ??? ABO/Rh Typing 12/16/2017 B POSTIVE   Final   ??? Sodium 12/20/2017 137  mmol/L Final   ??? Potassium 12/20/2017 4.6  mmol/L Final   ??? Chloride 12/20/2017 103  mmol/L Final   ??? CO2 12/20/2017 21.4  mmol/L Final    LOW   ??? BUN 12/20/2017 34  mg/dL Final    HIGH   ??? Creatinine 12/20/2017 1.14  mg/dL Final   ??? Glucose 95/62/1308 113  mg/dL Final    HIGH   ??? Calcium 12/20/2017 9.4  mg/dL Final   ??? Total Protein 12/20/2017 6.4  g/dL Final   ??? Total Bilirubin 12/20/2017 1.0  mg/dL Final   ??? AST 65/78/4696 15  U/L Final   ??? ALT 12/20/2017 25  U/L Final   ??? Alkaline Phosphatase 12/20/2017 106  U/L Final    HIGH   ??? EGFR MDRD Non Af Amer 12/20/2017 60  mL/min/1.85m2 Final   ??? EGFR MDRD Af Amer 12/20/2017 60  mL/min/1.10m2 Final   ??? Albumin 12/20/2017 3.4   Final    LOW   ??? Anion Gap 12/20/2017 17   Final    HIGH   Lab on 12/15/2017   Component Date Value Ref Range Status   ??? WBC 12/15/2017 0.4  10*9/L Final    LOW   ??? RBC 12/15/2017 2.77  10*12/L Final    LOW   ??? HGB 12/15/2017 8.1  g/dL Final    LOW   ??? HCT 12/15/2017 25.4  % Final    LOW   ??? MCV 12/15/2017 91.7  fL Final   ??? MCH 12/15/2017 29.2  pg Final   ??? MCHC 12/15/2017 31.9  g/dL Final    LOW   ??? RDW 12/15/2017 13.2  % Final   ??? MPV 12/15/2017 12.8  fL Final    HIGH   ??? Platelet 12/15/2017 13  10*9/L Final    LOW   ??? nRBC 12/15/2017 0  /100 WBCs Final   ??? Neutrophils % 12/15/2017 2.7  % Final   ??? Lymphocytes % 12/15/2017 86.8  % Final   ??? Monocytes % 12/15/2017 10.5  % Final   ??? Eosinophils % 12/15/2017 0.0  % Final   ??? Basophils % 12/15/2017 0.0  % Final   ??? Absolute Neutrophils 12/15/2017 0.0  10*9/L Final    LOW   ??? Absolute Lymphocytes 12/15/2017 0.3  10*9/L Final    LOW   ??? Absolute Monocytes 12/15/2017 0.0  10*9/L Final    LOW   ??? Absolute Eosinophils 12/15/2017 0.0  10*9/L Final   ??? Absolute Basophils 12/15/2017 0.0  10*9/L Final   ??? Hypochromasia 12/15/2017 Marked* Not Present Final   ??? Lymphs 12/15/2017 88   Final    HIGH   ??? Monocytes Manual 12/15/2017 12   Final   ??? PLT Morphology 12/15/2017 test check   Final    DEC   ??? BNP 12/15/2017 255.1  pg/mL Final   ??? Sodium 12/15/2017 138  mmol/L Final   ??? Potassium 12/15/2017 3.8  mmol/L Final   ??? Chloride 12/15/2017 107  mmol/L Final   ??? CO2 12/15/2017 20.8  mmol/L Final    LOW   ??? BUN 12/15/2017 27  mg/dL Final    HIGH   ??? Creatinine 12/15/2017 0.84  mg/dL Final   ??? Glucose 16/08/9603 108  mg/dL Final    HIGH   ??? Calcium 12/15/2017 9.3  mg/dL Final   ??? Total Protein 12/15/2017 6.1  g/dL Final   ??? Total Bilirubin 12/15/2017 0.6  mg/dL Final   ??? AST 54/07/8118 17  U/L Final   ??? ALT 12/15/2017 24  U/L Final   ??? Alkaline Phosphatase 12/15/2017 113  U/L Final    HIGH   ??? EGFR MDRD Non Af Amer 12/15/2017 60  mL/min/1.64m2 Final   ??? EGFR MDRD Af Amer 12/15/2017 60  mL/min/1.27m2 Final   ??? Albumin 12/15/2017 3.48   Final    LOW   ??? Anion Gap 12/15/2017 14   Final   Lab on 12/13/2017   Component Date Value Ref Range Status   ??? WBC 12/13/2017 0.6  10*9/L Final    LOW   ??? RBC 12/13/2017 2.95  10*12/L Final    LOW   ??? HGB 12/13/2017 8.8  g/dL Final    LOW   ??? HCT 12/13/2017 26.9  % Final    LOW   ??? MCV 12/13/2017 91.2  fL Final   ??? MCH 12/13/2017 29.8  pg Final   ??? MCHC 12/13/2017 32.7  g/dL Final   ??? RDW 14/78/2956 13.5  % Final   ??? MPV 12/13/2017 12.9  fL Final    HIGH   ??? Platelet 12/13/2017 20  10*9/L Final    LOW   ??? nRBC 12/13/2017 0  /100 WBCs Final   ??? Neutrophils % 12/13/2017 3.1  % Final   ??? Lymphocytes % 12/13/2017 79.4  % Final   ??? Monocytes % 12/13/2017 17.5  % Final   ??? Eosinophils % 12/13/2017 0.0  % Final   ??? Basophils % 12/13/2017 0.0  % Final   ???  Absolute Neutrophils 12/13/2017 0.0  10*9/L Final    LOW   ??? Absolute Lymphocytes 12/13/2017 0.5  10*9/L Final    LOW   ??? Absolute Monocytes 12/13/2017 0.1  10*9/L Final   ??? Absolute Eosinophils 12/13/2017 0.0  10*9/L Final   ??? Absolute Basophils 12/13/2017 0.0  10*9/L Final   ??? Hypochromasia 12/13/2017 Moderate* Not Present Final   ??? WBC Morphology 12/13/2017 test check   Final    MARKED DECREASE BUFFY COAT SMEAR PERFORMED ON PREVIOUS SPECIMEN   ??? PLT Morphology 12/13/2017 test check   Final    DEC   Infusion on 12/10/2017   Component Date Value Ref Range Status   ??? ABO/Rh Typing 12/10/2017 B POSTIVE   Final   ??? Antibody Screen 12/10/2017 NEGATIVE   Final   ??? 1 Unit Crossmatch 12/10/2017 B POSTIVE   Final    PATIENT RECEIVED 1 UNIT O POSTIVE 12/10/17   Lab on 12/10/2017   Component Date Value Ref Range Status   ??? WBC 12/10/2017 0.5  10*9/L Final    LOW   ??? RBC 12/10/2017 2.67  10*12/L Final    LOW   ??? HGB 12/10/2017 8.0  g/dL Final    LOW   ??? HCT 12/10/2017 23.2  % Final    LOW   ??? MCV 12/10/2017 86.9  fL Final   ??? MCH 12/10/2017 30.0  pg Final   ??? MCHC 12/10/2017 34.5  g/dL Final   ??? RDW 81/19/1478 13.5  % Final   ??? MPV 12/10/2017 11.6  fL Final    HIGH   ??? Platelet 12/10/2017 35  10*9/L Final    LOW   ??? nRBC 12/10/2017 0  /100 WBCs Final   ??? Neutrophils % 12/10/2017 7.4  % Final   ??? Lymphocytes % 12/10/2017 64.8  % Final   ??? Monocytes % 12/10/2017 27.8  % Final   ??? Eosinophils % 12/10/2017 0.0  % Final   ??? Basophils % 12/10/2017 0.0  % Final   ??? Absolute Neutrophils 12/10/2017 0.0  10*9/L Final    LOW   ??? Absolute Lymphocytes 12/10/2017 0.4  10*9/L Final   ??? Absolute Monocytes 12/10/2017 0.2  10*9/L Final   ??? Absolute Eosinophils 12/10/2017 0.0  10*9/L Final   ??? Absolute Basophils 12/10/2017 0.0  10*9/L Final   ??? Sodium 12/10/2017 139  mmol/L Final   ??? Potassium 12/10/2017 4.3  mmol/L Final   ??? Chloride 12/10/2017 106  mmol/L Final   ??? CO2 12/10/2017 23.3  mmol/L Final   ??? BUN 12/10/2017 24  mg/dL Final    HIGH   ??? Creatinine 12/10/2017 0.83  mg/dL Final   ??? Glucose 29/56/2130 109  mg/dL Final    HIGH   ??? Calcium 12/10/2017 9.1  mg/dL Final   ??? Total Protein 12/10/2017 6.0  g/dL Final   ??? Total Bilirubin 12/10/2017 0.8  mg/dL Final   ??? AST 86/57/8469 19  U/L Final   ??? ALT 12/10/2017 34  U/L Final   ??? Alkaline Phosphatase 12/10/2017 129  U/L Final    HIGH   ??? EGFR MDRD Non Af Amer 12/10/2017 60  mL/min/1.23m2 Final   ??? EGFR MDRD Af Amer 12/10/2017 60  mL/min/1.66m2 Final   ??? Albumin 12/10/2017 3.27   Final    LOW   ??? Anion Gap 12/10/2017 15   Final   Hospital Outpatient Visit on 12/09/2017   Component Date Value Ref Range Status   ??? Platelet 12/09/2017 32*  150 - 440 10*9/L Final   ??? Unit Blood Type 12/10/2017 A Pos   Final   ??? ISBT Number 12/10/2017 [id number]   Final   ??? Unit # 12/10/2017 [id number]   Final   ??? Status 12/10/2017 Transfused   Final   ??? Product ID 12/10/2017 Platelets   Final   ??? PRODUCT CODE 12/10/2017 [id number]   Final   ??? Crossmatch 12/13/2017 Compatible   Final   ??? Unit Blood Type 12/13/2017 B Pos   Final   ??? ISBT Number 12/13/2017 [id number]   Final   ??? Unit # 12/13/2017 [id number]   Final   ??? Status 12/13/2017 Transfused   Final   ??? Product ID 12/13/2017 Red Blood Cells   Final   ??? PRODUCT CODE 12/13/2017 [id number]   Final   ??? Crossmatch 12/13/2017 Compatible   Final   ??? Unit Blood Type 12/13/2017 B Pos   Final   ??? ISBT Number 12/13/2017 [id number]   Final   ??? Unit # 12/13/2017 [id number]   Final   ??? Status 12/13/2017 Released to [employer]   Final   ??? Spec Expiration 12/13/2017 [id number]   Final   ??? Product ID 12/13/2017 Red Blood Cells   Final   ??? PRODUCT CODE 12/13/2017 [id number]   Final   There may be more visits with results that are not included.        IMAGING STUDIES  No current imaging results    The total time spent discussing the previous history, imaging studies, laboratory studies, the role and rationale of AML therapy, palliative care and discussion was 40 minutes.  At least 50% of that time was spent in answering questions and counseling.    FOLLOW UP: AS DIRECTED       Ccc:

## 2017-12-30 NOTE — Unmapped (Signed)
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  Blood work today indicates that your counts remain low this is likely due to the combined effects of the chemotherapy and the leukemia which remains in your bone marrow and blood.  We will make arrangements for you to receive platelets.  The mouth sores are likely secondary to chemotherapy will continue Magic mouthwash and have also written for you to receive liquid morphine as needed for pain.  As we discussed your quality of life is been declining again this is likely due to the combined effects of the leukemia and the chemotherapy.  We will contact hospice so they may come out and talk with you and your family and determine what the best way is to support you all.  We intend to continue to care for you throughout your journey.         Learning About Hospice and Palliative Care  What are hospice and palliative care?    Palliative (say PAL-lee-uh-tiv) care is an area of medicine that helps give you more good days by providing care for quality-of-life issues. It includes treating symptoms like pain, nausea, or sleep problems. It can also include helping you and your loved ones to:  ?? Understand your illness better.  ?? Talk more openly about your feelings.  ?? Decide what treatments you want or don't want.  ?? Communicate better with your doctors, nurses, and each other.  Hospice care is a type of palliative care. But it's for people who are near the end of life.  What kinds of care are involved?  Palliative care: This treatment helps you feel better physically, emotionally, and spiritually while doctors also treat your illness. Your care may include pain relief, counseling, or nutrition advice.  Hospice care: Again, the goal of this type of care is to help you feel better. And it can help you get the most out of the time you have left. But you no longer get treatment to try to cure your illness.  When does care happen?  Palliative care: This care can happen at any time during a serious illness. You don't have to be near death to get this care.  Hospice care: In most cases, you can choose hospice care when your doctor believes that you have no more than about 6 months to live.  Where does the care happen?  Palliative care: This care often happens in hospitals or long-term care facilities like nursing homes. It can take place wherever you are treated, even in your home.  Hospice care: Most hospice care is done in the place the patient calls home. This is often the person's home. But it could also be a place like a nursing home or retirement center. Hospice care may also be given in hospice centers, hospitals, and other places.  Who provides the care?  Palliative care: There are doctors and nurses who specialize in this field. But your own doctor may also give some of this care. And there are many other experts who may help you. These include social workers, counselors, therapists, and Academic librarian.  Hospice care: In hospitals, hospice centers, and other facilities, care is given by doctors, nurses, and others who are trained in hospice care. In the home, a family member is often the main caregiver. But the family member gets help from care experts. They are on call 24 hours a day.  Where can you learn more?  Go to [facility] at [email or web address]  Select Preferences in the upper right hand corner, then select Health Library under Resources. Enter D515 in the search box to learn more about Learning About Hospice and Palliative Care.  Current as of: March 03, 2017  Content Version: 11.9  ?? 2006-2018 [employer]. Care instructions adapted under license by [facility]. If you have questions about a medical condition or this instruction, always ask your healthcare professional. [employer] disclaims any warranty or liability for your use of this information.

## 2017-12-31 ENCOUNTER — Encounter: Admit: 2017-12-31 | Discharge: 2018-01-01 | Payer: PRIVATE HEALTH INSURANCE

## 2017-12-31 DIAGNOSIS — D6189 Other specified aplastic anemias and other bone marrow failure syndromes: Secondary | ICD-10-CM | POA: Diagnosis not present

## 2017-12-31 DIAGNOSIS — C9200 Acute myeloblastic leukemia, not having achieved remission: Secondary | ICD-10-CM | POA: Diagnosis not present

## 2017-12-31 MED ORDER — NYSTATIN 100,000 UNIT/GRAM TOPICAL POWDER
1 refills | 0 days | Status: CP
Start: 2017-12-31 — End: 2018-12-31

## 2017-12-31 NOTE — Unmapped (Signed)
Cassandra called from [facility] with some questions about Mr Assunta Found Medications.  She requested a call back to go over the referral that was sent on the patient. Please contact her at [phone]

## 2017-12-31 NOTE — Unmapped (Signed)
Patient arrived to clinic in wheelchair.  Assisted with transfers.  Patient wife c/o sore in between patient fourth and fifth toes on left foot.  Patient wife also has concerns about stopping venclexta.  Dr. Angelene Giovanni spoke with both patient and wife regarding both concerns.  Orders for nystatin powder rx for toe.  Family will discuss further the information regarding hospice and venclexta.  IV started to right arm.  Premeds given as ordered.  Platelets given as ordered.  Patient had temp of 99.27f at check.  Physician notified.  Orders given to continue platelet transfusion.  Patient tolerated without complaint.  Nystatin called in to [facility] as e-prescribe failed.  IV flushed and d/c.  Patient d/c to home and aware of return appointment.

## 2017-12-31 NOTE — Unmapped (Signed)
Returned call to [facility] (hospice nurse) regarding patients's Venclexta prescription. Hospice will be unable to renew this medication once patient is under their care. Dr. Angelene Giovanni will be notified of this on 12/31/17 when he is back in the office and a return call will be made to Cassandra with any update.

## 2018-01-03 ENCOUNTER — Other Ambulatory Visit: Admit: 2018-01-03 | Discharge: 2018-01-04 | Payer: PRIVATE HEALTH INSURANCE

## 2018-01-03 DIAGNOSIS — T451X5A Adverse effect of antineoplastic and immunosuppressive drugs, initial encounter: Secondary | ICD-10-CM | POA: Diagnosis not present

## 2018-01-03 DIAGNOSIS — D61818 Other pancytopenia: Secondary | ICD-10-CM | POA: Diagnosis not present

## 2018-01-03 DIAGNOSIS — A419 Sepsis, unspecified organism: Secondary | ICD-10-CM | POA: Diagnosis not present

## 2018-01-03 DIAGNOSIS — I712 Thoracic aortic aneurysm, without rupture: Secondary | ICD-10-CM | POA: Diagnosis not present

## 2018-01-03 DIAGNOSIS — K1231 Oral mucositis (ulcerative) due to antineoplastic therapy: Secondary | ICD-10-CM | POA: Diagnosis not present

## 2018-01-03 DIAGNOSIS — C9200 Acute myeloblastic leukemia, not having achieved remission: Secondary | ICD-10-CM | POA: Diagnosis not present

## 2018-01-03 DIAGNOSIS — K219 Gastro-esophageal reflux disease without esophagitis: Secondary | ICD-10-CM | POA: Diagnosis not present

## 2018-01-03 DIAGNOSIS — I2510 Atherosclerotic heart disease of native coronary artery without angina pectoris: Secondary | ICD-10-CM | POA: Diagnosis not present

## 2018-01-03 DIAGNOSIS — Z79899 Other long term (current) drug therapy: Secondary | ICD-10-CM | POA: Diagnosis not present

## 2018-01-03 DIAGNOSIS — D61810 Antineoplastic chemotherapy induced pancytopenia: Secondary | ICD-10-CM | POA: Diagnosis not present

## 2018-01-03 DIAGNOSIS — R05 Cough: Secondary | ICD-10-CM | POA: Diagnosis not present

## 2018-01-03 DIAGNOSIS — R509 Fever, unspecified: Secondary | ICD-10-CM | POA: Diagnosis not present

## 2018-01-03 DIAGNOSIS — Z87891 Personal history of nicotine dependence: Secondary | ICD-10-CM | POA: Diagnosis not present

## 2018-01-03 DIAGNOSIS — I700 Atherosclerosis of aorta: Secondary | ICD-10-CM | POA: Diagnosis not present

## 2018-01-03 DIAGNOSIS — D709 Neutropenia, unspecified: Secondary | ICD-10-CM | POA: Diagnosis not present

## 2018-01-03 DIAGNOSIS — Z66 Do not resuscitate: Secondary | ICD-10-CM | POA: Diagnosis not present

## 2018-01-03 DIAGNOSIS — C9300 Acute monoblastic/monocytic leukemia, not having achieved remission: Principal | ICD-10-CM

## 2018-01-03 LAB — CBC W/ DIFFERENTIAL
BASOPHILS ABSOLUTE COUNT: 0 10*9/L
BASOPHILS RELATIVE PERCENT: 0 %
EOSINOPHILS ABSOLUTE COUNT: 0 10*9/L
EOSINOPHILS RELATIVE PERCENT: 0 %
HEMATOCRIT: 21 %
HEMOGLOBIN: 7.2 g/dL
LYMPHOCYTES ABSOLUTE COUNT: 0.2 10*9/L
LYMPHOCYTES RELATIVE PERCENT: 93.8 %
MEAN CORPUSCULAR HEMOGLOBIN CONC: 34.3 g/dL
MEAN CORPUSCULAR HEMOGLOBIN: 29.6 pg
MEAN CORPUSCULAR VOLUME: 86.4 fL
MEAN PLATELET VOLUME: 9.3 fL
MONOCYTES ABSOLUTE COUNT: 0 10*9/L
MONOCYTES RELATIVE PERCENT: 0 %
NEUTROPHILS ABSOLUTE COUNT: 0 10*9/L
NEUTROPHILS RELATIVE PERCENT: 6.2 %
PLATELET COUNT: 19 10*9/L
RED BLOOD CELL COUNT: 2.43 10*12/L
RED CELL DISTRIBUTION WIDTH: 12.1 %
WBC ADJUSTED: 0.2 10*9/L

## 2018-01-03 LAB — HYPOCHROMIA: Lab: 0

## 2018-01-05 NOTE — Unmapped (Signed)
Hi Melissa,     Patient cancelled appointment for tomorrow due to being an inpatient at [facility].  Patient will call back when ready to reschedule.    Thanks in advance,  Kathrene Bongo  [facility]  [phone]

## 2018-01-06 ENCOUNTER — Encounter: Admit: 2018-01-06 | Discharge: 2018-01-06 | Payer: PRIVATE HEALTH INSURANCE

## 2018-01-06 ENCOUNTER — Ambulatory Visit: Admit: 2018-01-06 | Discharge: 2018-01-06 | Payer: PRIVATE HEALTH INSURANCE

## 2018-01-06 DIAGNOSIS — C9200 Acute myeloblastic leukemia, not having achieved remission: Secondary | ICD-10-CM | POA: Diagnosis not present

## 2018-01-06 DIAGNOSIS — R627 Adult failure to thrive: Secondary | ICD-10-CM | POA: Diagnosis not present

## 2018-01-06 NOTE — Unmapped (Signed)
[name] in clinic today for follow up of Acute Myeloid Leukemia. Patient states he has mouth sores and is unable to eat well due to the pain. Upon assessment noted blisters and sores to inside of mouth and lips. Patient also complaints of productive cough and shortness of breath has increased. Patient also noted to have an ulcer between fourth and fifth toe to left foot. Area is worse from previous assessment. Area to top of foot has severe erythema with grey tinted area to center. Upon assessment cleansed area with warm water. 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 Patient complained of severe pain while cleaning area. Patient states he is unable to walk from the pain. Encouraged patient to use PRN Morphine ordered by provider for mouth pain and foot pain. Also instructed patient to continue to use magic mouthwash.

## 2018-01-06 NOTE — Unmapped (Addendum)
You are seen today in follow-up regarding your leukemia.  As we discussed chemotherapy has not been effective in managing your disease.  At this point I believe that your quality of life would be better on hospice and to that end we will facilitate that referral.  As always we are available whenever needed.

## 2018-01-06 NOTE — Unmapped (Signed)
??  [facility], [facility]   Hematology Oncology Return Visit   DATE OF SERVICE  01/06/2018     REFERRING PROVIDER   Richardean Chimera, Md  [address] [place], [place] [address]    PRIMARY CARE PROVIDER  Richardean Chimera, MD  [address] 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    CANCER HISTORY  Oncology History    Diagnosis: AML    Presentation: Pancytopenia    Bone marrow biopsy: 09/14/2017  Hypercellular (>95%) marrow with approximately 40% blasts  Dysmegakaryopoiesis  Aspirate- predominance of blasts- no manual aspirate differential given    Flow- 39% blasts- CD34+ (subset), CD13, CD15, CD7, CD11c ,CD33, CD117, HLA-DR    Cytogenetics: Normal    Molecular Mutations: Not done    Treatment:  Azacitidine 75 mg/m2 SQ days 1-7  Cycle 1: 11/07    C/b multiple infections and hospitalizations.    Myeloid Panel from PB:  BCOR: c.2124delC (p.Gly709AlafsTer)  FLT3: c.2505T>G (p.Asp835Glu) [FLT3-TKD]  NPM1: c.860_863dupTCTG (p.Trp288CysfsTer12) [Type A Mutation]  WT1: c.1144delG (p.Ala382HisfsTer67)    Cycle 2: Azacitidine  - 11/12/2017    Venetoclax initiated on 11/19/2017      Final Diagnosis   Date Value Ref Range Status   12/03/2017   Final    Bone marrow, right iliac, aspiration and biopsy  -  Hypercellular bone marrow (80%) with persistent involvement by acute myeloid leukemia (32% blasts by manual aspirate differentiation).  -  Cytogenetic studies are pending.  -  Flow cytometric MRD analysis is pending.  -  NPM1 quantitation study is pending.       Flow MRD = Undetectable        Acute myeloid leukemia (AML), M5 (CMS-HCC)    09/17/2017 Initial Diagnosis     Acute myeloid leukemia (AML), M5 (CMS-HCC)          CANCER STAGING  Cancer Staging  No matching staging information was found for the patient.    CURRENT HISTORY    He is seen today for a post hospital follow up.  Patient was admitted to [facility] from February 18 to 20 2019 for management of neutropenic pneumonia.  He received IV abx and 4 units of PRBC's during the hospitalization.  Hospice was recommended by the physicians caring for him there.  Patient and his wife explained that they have been holding off on proceeding with hospice because they are awaiting benefits from the [place].  Patient is having considerable pain in left foot due to an ulcer between 4th and 5th toes.  He is spending all of his time in the recliner except to go to the bathroom.  Sleeping quite a bit.  No fevers since discharge from the hospital.  His fever was 101F prior to hospital admission.   He still has a cough.   Hospice came out to the home yesterday and offered their services.  Patient and wife were told that he could only receive 2 more blood transfusions if he were to be on hospice.  He is not taking Venetoclax  He is using Magic Mouthwash to help with mucositis.  He has not used oral morphine since discharge from the hospital.  ASSESSMENT    72 year old male with history of coronary artery disease, MI, GERD, colon polyps, tobacco dependence and overall poor performance status.    Patient has AML with normal karyotype however myeloid panel from peripheral blood revealed mutations in NPM1, FLT3-D835, BCOR, and WT1.  Patient has been treated with a cycle of cytarabine/ venetoclax followed by venetoclax.  Course has also been complicated by persistent cytopenias.      RECOMMENDATION/PLAN    AML with with normal karyotype but with mutations in NPM1, FLT3-D835, BCOR, and WT1:  Patient has never achieved remission.  Course of therapy has been comp located by persistent cytopenias.  Bone marrow biopsy obtained on December 03, 2017 shows a considerable amount of residual disease.  Patient began his latest cycle of palliative azacitadine on December 15, 2017.  His performance status has continued to decline with chemotherapy and the cytopenias are no better.  He is not a candidate for additional chemotherapy.    Possible otitis: Completed the course of Augmentin 875 mg po bid x 14 days on December 15 2017.        Transfusion support: Previously we have transfused 2 units of packed red blood cells for hemoglobin less than 8 and 1 unit of PLT's if  PLT count is less than 10 and/or actively bleeding.  He is not having any bleeding issues.  Given his overall decline I do not think that empiric administration of blood products will benefit him.  I do think it is reasonable to transfuse him if he is symptomatic with anemia or having clinically significant bleeding.    Infection prophylaxis: Currently on Levaquin, posaconazole, and acyclovir for prophylaxis against bacterial, fungal and HSV infections respectively.  Despite all of this patient is still susceptible to infection since he does not have a functioning immune system.    Shortness of breath:  BNP on January 30th 2019 does not suggest a cardiac cause.  The normal RA O2 sat does not suggest a pulmonary cause.  Marland Kitchen      Poor performance status: Likely multifactorial but the main culprit is likely his underlying malignancy.  There is no expectation that this will improve    Infection of left foot: Certainly could be bacterial but fungal organism is certainly in the differential.  It is not likely able to be eradicated since the patient does not have a functioning immune system.  It is causing him considerable pain therefore recommend local measures.    Prognosis: His overall prognosis is likely measurable in days.  He meets criteria for hospice.  I feel strongly that his quality of life would be much improved on hospice versus any further attempts at altering the trajectory of his disease.  He is not likely to benefit from empiric transfusion of blood products.  He would benefit from nursing care, dressing to the foot wound, narcotic analgesics and to be surrounded by family members and friends.  The patient and his wife are concerned that transitioning to hospice may interfere with his VA benefits however I am doubtful that the 2 are related.  In addition patient is suffering considerably.  For she they have agreed to allow hospice services to be instituted following today's visit.       HISTORY OF PRESENT ILLNESS   Arthur Bates is a 72 y.o. 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  He had associated with this extreme fatigue, generalized weakness, malaise, and near nightly damp sweats.  ??  On September 14, 2017 a bone marrow aspiration and biopsy was performed at [facility]. 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 Correlation with cytogenetics and fluorescent in situ hybridization was requested.  Evaluation by [facility] revealed  a (46, XY) karyotype.  DNA probe specific molecular analysis failed to reveal any cytogenetic abnormality on chromosome 8 to rule out the presence of trisomy 8.  Unfortunately further molecular profiling was not performed although requested by Dr. Donnie Coffin.  Molecular studies for certain abnormalities, such as mutations in FLT3, nucleophosmin (NPM1), KIT, CEBPA, or RUNX1 as well as gene expression profiles confer prognostic significance in adults with AML. AML with mutations of NPM1, RUNX1, biallelic CEBPA, PML-RARA, and others are included as specific entities in the World Health Organization classification.    ??  On September 22, 2017 he was started on cycle 1 of single agent azacitidine once daily for 7 days out of 28.  With the exception of hematologic toxicity in the form of cytopenias, overall tolerance had been acceptable.  ??  On October 04, 2017 Patient was admitted to [facility] for evaluation and management of extreme fatigue.  While hospitalized he received IVF, infectious workup and evaluation; empiric antimicrobial therapy pending the results of pancultures, 2 view chest x-ray, EKG and echocardiogram, troponin levels, and supportive care.  After 2 hospital days, he was discharged home.     ??On October 24, 2017 he was admitted with high fever (102-103) to the service of Dr. Donzetta Sprung, his primary care physician.  In addition to anemia and thrombocytopenia, he felt weak and tired.  Pancultures were obtained and he was started on empiric antibiotic therapy with vancomycin and Zosyn.  Several will sets of blood cultures were negative.  He was transfused 2 units of packed red blood cells.  At the time of his discharge on December 14, he was afebrile.  While hospitalized, his white blood count had risen to 25-30K with blasts in his peripheral blood.  His fever was felt to be due to his underlying leukemic process.  ??  Cycle 2 of azacitidine was scheduled to begin on November 01 2017.  That treatment was deferred due to hospitalization and fever workup and empiric antibiotics which failed to reveal any evidence of infection.    ??  Because of his deteriorating laboratory and clinical condition, he was admitted to [facility] on November 11, 2017.  Under the direction of Dr. Cristal Deer Dittus, he was started on hydroxyurea, then transitioned to azacitidine and venetoclax beginning November 11, 2017.  Azacitidine was completed on November 18, 2017. This represented Cycle 2 of chemotherapy.  Though treated with cefepime from December 31 to January 4, then transition to levofloxacin at the time of discharge, his cultures were persistently negative.CT imaging of the chest without intravenous contrast was performed on December 31 at [facility] revealed bibasilar subpleural opacities consistent with fibrosis.  No discrete infiltrate or consolidation was identified. His anorexia, and nutritional status and functional status were poor necessitating admission to [facility].      Patient underwent bone marrow bx  and aspirate performed at [facility] on 12/03/2017.   Pathology from this revealed a hypercellular bone marrow (80%) with persistent involvement by acute myeloid leukemia (32% blasts by manual aspirate differentiation).  Cytogenetic studies, flow cytometric MRD analysis and NPM1 quantitation study are pending.     The patient was seen at [facility] on 12/09/17 by Dr. Artemio Aly in follow up and it was recommended that Azacitadine be continued despite the cytopenias.  He received a 1 of 2 planned units of PRBC's and a unit of PLT's yesterday on that day due to time constraints.  He receied a second unit of PRBC's the following day.  He was discharged from the [facility] facility to home on December 11 2017     Restarted Azacitadine 75 mg/m2 sq x 7 days on December 15 2017.  BNP on 12/15/17 was 255.1 pg/mL  Patient received 2 units PRBC's  and PLT's on December 23 2017.       ??  ??PAST MEDICAL HISTORY  Past Medical History:   Diagnosis Date   ??? Coronary artery disease    ??? Depression    ??? Dissecting aortic aneurysm (any part), abdominal (CMS-HCC)    ??? ED (erectile dysfunction)    ??? GERD (gastroesophageal reflux disease)    ??? HTN (hypertension)    ??? Hyperlipidemia    ??? Ischemic heart disease    ??? Osteoarthritis of knee        SURGICAL HISTORY  Past Surgical History:   Procedure Laterality Date   ??? CARDIAC CATHETERIZATION     ??? COLONOSCOPY  2009   ??? CORONARY ANGIOPLASTY WITH STENT PLACEMENT     ??? KNEE SURGERY     ??? TOTAL KNEE ARTHROPLASTY         ALLERGIES:  Allergies   Allergen Reactions   ??? Ace Inhibitors Cough   ??? Shellfish Containing Products Nausea And Vomiting   ??? Zithromax [Azithromycin] Rash       MEDICATIONS:    Current Outpatient Prescriptions:   ???  acyclovir (ZOVIRAX) 400 MG tablet, Take 400 mg by mouth Two (2) times a day., Disp: , Rfl:   ???  allopurinol (ZYLOPRIM) 300 MG tablet, Take 1 tablet (300 mg total) by mouth daily., Disp: 30 tablet, Rfl: 0  ???  diphenhydramine HCl (MAGIC MOUTHWASH WITH LIDOCAINE ORAL) suspension, Take 10 mL by mouth Four (4) times a day., Disp: 240 mL, Rfl: 3  ???  diphenhydramine HCl (MAGIC MOUTHWASH WITH LIDOCAINE ORAL) suspension, Take 10 mL by mouth Four (4) times a day., Disp: 1 Bottle, Rfl: 3  ???  levoFLOXacin (LEVAQUIN) 500 MG tablet, Take 500 mg by mouth daily., Disp: , Rfl:   ???  MORPhine 100 mg/5 mL (20 mg/mL) concentrated solution, Take 0.25 mL (5 mg total) by mouth every two (2) hours as needed for pain. for up to 14 days, Disp: 30 mL, Rfl: 0  ???  nystatin (MYCOSTATIN) powder, Apply to affected area 3 times daily, Disp: 15 g, Rfl: 1  ???  polyethylene glycol (MIRALAX) 17 gram packet, MIX 1 PACKET OF POWDER (17 GM) IN 8 OZ OF JUICE OR WATER AND DRINK BY MOUTH DAILY, Disp: , Rfl: 6  ???  posaconazole (NOXAFIL) 100 mg TbEC delayed released tablet, Take 300 mg by mouth daily., Disp: 90 tablet, Rfl: 2  ???  venetoclax (VENCLEXTA) 100 mg tablet, Take 1 tablet (100 mg total) by mouth daily with evening meal. Take with a meal and water. Do not  chew, crush, or break tablets., Disp: 120 tablet, Rfl: 6  ???  famotidine (PEPCID) 20 MG tablet, Take 1 tablet (20 mg total) by mouth Two (2) times a day., Disp: 60 tablet, Rfl: 0  ???  megestrol (MEGACE) 400 mg/10 mL (40 mg/mL) suspension, TAKE BY MOUTH TWICE DAILY, Disp: , Rfl: 0  ???  mirtazapine (REMERON) 15 MG tablet, Take 1 tablet (15 mg total) by mouth nightly., Disp: 90 tablet, Rfl: 3  ???  ondansetron (ZOFRAN) 8 MG tablet, Take 1 tablet (8 mg total) by mouth every twelve (12) hours as needed for nausea. (Patient not taking: Reported on 12/30/2017), Disp: 20 tablet, Rfl: 0       REVIEW OF SYSTEMS  Constitutional: No fever, sweats, or shaking chills currently.  He is anorectic,  ECOG Status is 4  HEENT: No visual changes.  No changes in voice.  Has mouth sores but is not using morphine he occasionally uses Magic mouthwash..     Pulmonary: No unusual cough, sore throat, or orthopnea. SOB on standing up.    Cardiovascular: Has history of coronary artery disease and MI.  No angina, or palpitations.     Gastrointestinal: Has nausea but no vomiting, dysphagia, odynophagia, abdominal pain, diarrhea, or constipation. No change in bowel habits.   Genitourinary: No frequency, urgency, hematuria, or dysuria.   Musculoskeletal: No arthralgias or myalgias; no back pain;  no joint swelling, pain or instability.   Hematologic: No bleeding tendency or easy bruisability.   Endocrine: Intolerance to cold; no thyroid disease or diabetes mellitus.   Skin: He has an open wound between the fourth and fifth toes of the left foot which she has been dressing and using antifungals.  Vascular: No peripheral arterial or venous thromboembolic disease.   Psychological: No anxiety, depression, or mood changes; no mental health illnesses.   Neurological: Has dizziness, lightheadedness.  No syncope, or near syncopal episodes; no numbness or tingling in the fingers or toes.     PHYSICAL EXAMINATION  There were no vitals taken for this visit.   Reviewed vitals taken by NSG staff.    General:   Fatigued, chronically ill-appearing, frail, gray.  Here with wife.  He is unable to sit in his wheelchair but rather reclines on the exam table with his feet propped up in the wheelchair   Eyes:   Pupil equal round reacting to light and accomodation.  Extra occular muscles intact, and sclera clear and without icteris.   ENT:   Oropharynx without mucositis, or thrush.  Mucosa dry.     Neck:   Supple without any enlargement, no thyromegaly, bruit, or jugular venous distention.   Lymph Nodes:  No adenopathy (cervical, supraclavicular, axillary, inguinal)   Cardiovascular:  RRR, normal S1, S2 without murmur, rub, or gallop.  Pulses 2+ equal on both sides without any bruits.   Lungs:  Clear to auscultation bilaterally, without wheezes/crackles/rhonchi.  Good air movement.   Skin:     Necrotic ulcer between fourth and fifth toes of left foot   Psychiatry:   Alert and oriented to person, place, and time    Abdomen:   Normoactive bowel sounds, abdomen soft, non-tender and not distended, no Hepatosplenomegaly or masses.  Liver normal in size, no rebound or guarding.    Extremities:   No bilateral cyanosis, clubbing or edema.  Necrotic ulcer between fourth and fifth toes of left foot   Musculo Skeletal:   No joint tenderness, deformity, effusions.  No spine or costovertebral angle  tenderness.  Full range of motion in shoulder, elbow, hip, knee, ankle, hands and feet.  Decreased muscle mass.   Neurological:  Alert and oriented to person, place and time.  Cranial nerves II-XII grossly intact, unsteady gait, normal sensation throughout, normal cerebellar function.           LABORATORY STUDIES  Lab on 01/03/2018   Component Date Value Ref Range Status   ??? WBC 01/03/2018 0.2  10*9/L Final    LOW   ??? RBC 01/03/2018 2.43  10*12/L Final    LOW   ??? HGB 01/03/2018 7.2  g/dL Final    LOW   ??? HCT 01/03/2018 21.0  % Final    LOW   ??? MCV 01/03/2018 86.4  fL Final   ??? MCH 01/03/2018 29.6  pg Final   ??? MCHC 01/03/2018 34.3  g/dL Final   ??? RDW 16/08/9603 12.1  % Final   ??? MPV 01/03/2018 9.3  fL Final   ??? Platelet 01/03/2018 19  10*9/L Final    LOW   ??? nRBC 01/03/2018 0  /100 WBCs Final   ??? Neutrophils % 01/03/2018 6.2  % Final   ??? Lymphocytes % 01/03/2018 93.8  % Final   ??? Monocytes % 01/03/2018 0.0  % Final   ??? Eosinophils % 01/03/2018 0.0  % Final   ??? Basophils % 01/03/2018 0.0  % Final   ??? Absolute Neutrophils 01/03/2018 0.0  10*9/L Final    LOW   ??? Absolute Lymphocytes 01/03/2018 0.2  10*9/L Final    LOW   ??? Absolute Monocytes 01/03/2018 0.0  10*9/L Final    LOW   ??? Absolute Eosinophils 01/03/2018 0.0  10*9/L Final   ??? Absolute Basophils 01/03/2018 0.0  10*9/L Final   Infusion on 12/31/2017   Component Date Value Ref Range Status   ??? Platelet Product 12/31/2017 1 UNIT   Final    1 UNIT B POSTIVE PLATELET PHERESIS   Lab on 12/30/2017   Component Date Value Ref Range Status   ??? Sodium 12/30/2017 133  mmol/L Final    LOW   ??? Potassium 12/30/2017 3.8  mmol/L Final   ??? Chloride 12/30/2017 100  mmol/L Final   ??? CO2 12/30/2017 18.1  mmol/L Final    LOW   ??? BUN 12/30/2017 30  mg/dL Final    HIGH   ??? Creatinine 12/30/2017 0.84  mg/dL Final   ??? Glucose 54/07/8118 160  mg/dL Final    HIGH   ??? Calcium 12/30/2017 9.5  mg/dL Final   ??? Total Protein 12/30/2017 6.8  g/dL Final   ??? Total Bilirubin 12/30/2017 2.1  mg/dL Final    HIGH   ??? AST 12/30/2017 12  U/L Final   ??? ALT 12/30/2017 20  U/L Final   ??? Alkaline Phosphatase 12/30/2017 119  U/L Final    HIGH   ??? EGFR MDRD Non Af Amer 12/30/2017 60  mL/min/1.2m2 Final   ??? EGFR MDRD Af Amer 12/30/2017 60  mL/min/1.8m2 Final   ??? Albumin 12/30/2017 3.2   Final    LOW   ??? Anion Gap 12/30/2017 19   Final    HIGH   ??? WBC 12/30/2017 0.2  10*9/L Final    LOW   ??? RBC 12/30/2017 2.96  10*12/L Final    LOW   ??? HGB 12/30/2017 8.8  g/dL Final    LOW   ??? HCT 12/30/2017 26.2  % Final    LOW   ??? MCV 12/30/2017  88.5  fL Final   ??? MCH 12/30/2017 29.7  pg Final   ??? MCHC 12/30/2017 33.6  g/dL Final   ??? RDW 57/84/6962 12.5  % Final   ??? Platelet 12/30/2017 4  10*9/L Final    LOW   ??? nRBC 12/30/2017 0  /100 WBCs Final   ??? Neutrophils % 12/30/2017 4.8  % Final   ??? Lymphocytes % 12/30/2017 95.2  % Final   ??? Monocytes % 12/30/2017 0.0  % Final   ??? Eosinophils % 12/30/2017 0.0  % Final   ??? Basophils % 12/30/2017 0.0  % Final   ??? Absolute Neutrophils 12/30/2017 0.0  10*9/L Final    LOW   ??? Absolute Lymphocytes 12/30/2017 0.2  10*9/L Final LOW   ??? Absolute Monocytes 12/30/2017 0.0  10*9/L Final    LOW   ??? Absolute Eosinophils 12/30/2017 0.0  10*9/L Final   ??? Absolute Basophils 12/30/2017 0.0  10*9/L Final   ??? Microcytosis 12/30/2017 Marked* Not Present Corrected   ??? Anisocytosis 12/30/2017 Marked* Not Present Corrected   ??? Hypochromasia 12/30/2017 Moderate* Not Present Corrected   ??? WBC Morphology 12/30/2017 test check   Final    MARKERD LEUKOCYTOPENIA NOTED, MARKED MYMPHOCYTOSIS NOTED,NEUTROPENIA    ??? PLT Morphology 12/30/2017 test check   Corrected    MARKED THROMBOCTYOPENIA   Lab on 12/27/2017   Component Date Value Ref Range Status   ??? WBC 12/27/2017 0.3  10*9/L Final    LOW   ??? RBC 12/27/2017 3.39  10*12/L Final    LOW   ??? HGB 12/27/2017 10.2  g/dL Final    LOW   ??? HCT 12/27/2017 30.2  % Final    LOW   ??? MCV 12/27/2017 89.1  fL Final   ??? MCH 12/27/2017 30.1  pg Final   ??? MCHC 12/27/2017 33.8  g/dL Final   ??? RDW 95/28/4132 13.1  % Final   ??? MPV 12/27/2017 11.3  fL Final    HIGH   ??? Platelet 12/27/2017 11  10*9/L Final    LOW   ??? nRBC 12/27/2017 0  /100 WBCs Final   ??? Neutrophils % 12/27/2017 3.1  % Final   ??? Lymphocytes % 12/27/2017 96.9  % Final   ??? Monocytes % 12/27/2017 0.0  % Final   ??? Eosinophils % 12/27/2017 0.0  % Final   ??? Basophils % 12/27/2017 0.0  % Final   ??? Absolute Neutrophils 12/27/2017 0.0  10*9/L Final    LOW   ??? Absolute Lymphocytes 12/27/2017 0.3  10*9/L Final    LOW   ??? Absolute Monocytes 12/27/2017 0.0  10*9/L Final    LOW   ??? Absolute Eosinophils 12/27/2017 0.0  10*9/L Final   ??? Absolute Basophils 12/27/2017 0.0  10*9/L Final   Infusion on 12/23/2017   Component Date Value Ref Range Status   ??? ABO/Rh Typing 12/22/2017 B POSTIVE   Final   ??? Antibody ID 12/22/2017 NOT DONE   Final   ??? Antibody Screen 12/22/2017 NEGATIVE   Final   ??? 2 Unit Crossmatch 12/23/2017 GAVE 2 UNITS   Final    PATINET RECIEVED 2 UNITS OF BLOOD 12/23/17 1ST UNIT WAS O NEG 2ND UNIT WAS O POSTIVE   ??? Platelet Product 12/23/2017 B POSTIVE   Final    PATIENT RECIEVED 1 UNIT PLT PHERESIS 12/23/17   Lab on 12/22/2017   Component Date Value Ref Range Status   ??? WBC 12/22/2017 0.3  10*9/L Final  LOW   ??? RBC 12/22/2017 2.73  10*12/L Final    LOW   ??? HGB 12/22/2017 8.0  g/dL Final    LOW   ??? HCT 12/22/2017 24.8  % Final    LOW   ??? MCV 12/22/2017 90.8  fL Final   ??? MCH 12/22/2017 29.3  pg Final   ??? MCHC 12/22/2017 32.3  g/dL Final   ??? RDW 57/84/6962 13.0  % Final   ??? MPV 12/22/2017 10.3  fL Final   ??? Platelet 12/22/2017 9  10*9/L Final    LOW   ??? nRBC 12/22/2017 0  /100 WBCs Final   ??? Neutrophils % 12/22/2017 6.0  % Final   ??? Lymphocytes % 12/22/2017 87.9  % Final   ??? Monocytes % 12/22/2017 6.1  % Final   ??? Eosinophils % 12/22/2017 0.0  % Final   ??? Basophils % 12/22/2017 0.0  % Final   ??? Absolute Neutrophils 12/22/2017 0.0  10*9/L Final    LOW   ??? Absolute Lymphocytes 12/22/2017 0.3  10*9/L Final    LOW   ??? Absolute Monocytes 12/22/2017 0.0  10*9/L Final    LOW   ??? Absolute Eosinophils 12/22/2017 0.0  10*9/L Final   ??? Absolute Basophils 12/22/2017 0.0  10*9/L Final   ??? Microcytosis 12/22/2017 Marked* Not Present Final   ??? Anisocytosis 12/22/2017 Marked* Not Present Final   ??? Hypochromasia 12/22/2017 Moderate* Not Present Final   ??? Poikilocytosis 12/22/2017 Occasional   Final   ??? WBC Morphology 12/22/2017 test check   Final    MARKED LEUKOCYTOPENIA NOTED, MARKED LYMPHOCYTOPENIA NOTED, DECRESED NEUTROPHILIA NOTED   ??? Sodium 12/22/2017 135  mmol/L Final    LOW   ??? Potassium 12/22/2017 4.6  mmol/L Final   ??? Chloride 12/22/2017 106  mmol/L Final   ??? CO2 12/22/2017 19.4  mmol/L Final    LOW   ??? BUN 12/22/2017 29  mg/dL Final    HIGH   ??? Creatinine 12/22/2017 1.17  mg/dL Final   ??? Glucose 95/28/4132 110  mg/dL Final    HIGH   ??? Calcium 12/22/2017 9.4  mg/dL Final   ??? Total Protein 12/22/2017 6.4  g/dL Final   ??? Total Bilirubin 12/22/2017 0.8  mg/dL Final   ??? AST 44/11/270 15  U/L Final   ??? ALT 12/22/2017 26  U/L Final   ??? Alkaline Phosphatase 12/22/2017 113  U/L Final    HIGH   ??? EGFR MDRD Non Af Amer 12/22/2017 60  mL/min/1.70m2 Final   ??? EGFR MDRD Af Amer 12/22/2017 60  mL/min/1.21m2 Final   ??? Albumin 12/22/2017 3.45   Final    LOW   ??? Anion Gap 12/22/2017 14   Final   Lab on 12/20/2017   Component Date Value Ref Range Status   ??? WBC 12/20/2017 0.5  10*9/L Final    LOW   ??? RBC 12/20/2017 2.83  10*12/L Final    LOW   ??? HGB 12/20/2017 8.4  g/dL Final    LOW   ??? HCT 12/20/2017 25.5  % Final    LOW   ??? MCV 12/20/2017 90.1  fL Final   ??? MCH 12/20/2017 29.7  pg Final   ??? MCHC 12/20/2017 32.9  g/dL Final   ??? RDW 53/66/4403 13.2  % Final   ??? MPV 12/20/2017 12.7  fL Final    HIGH   ??? Platelet 12/20/2017 22  10*9/L Final    LOW   ??? nRBC 12/20/2017 0  /  100 WBCs Final   ??? Neutrophils % 12/20/2017 2.0  % Final   ??? Lymphocytes % 12/20/2017 92.0  % Final   ??? Monocytes % 12/20/2017 6.0  % Final   ??? Eosinophils % 12/20/2017 0.0  % Final   ??? Basophils % 12/20/2017 0.0  % Final   ??? Absolute Neutrophils 12/20/2017 0.0  10*9/L Final    LOW   ??? Absolute Lymphocytes 12/20/2017 0.5  10*9/L Final    LOW   ??? Absolute Monocytes 12/20/2017 0.0  10*9/L Final    LOW   ??? Absolute Eosinophils 12/20/2017 0.0  10*9/L Final   ??? Absolute Basophils 12/20/2017 0.0  10*9/L Final   Infusion on 12/17/2017   Component Date Value Ref Range Status   ??? 1 Unit Crossmatch 12/16/2017 O POSITVE   Final    1 UNIT O POSTIVE WAS GIVEN 12/17/17   ??? Platelets 12/17/2017 B POSTIVE   Final    1 UNIT B POSTIVE PLATELETS   ??? ABO/Rh Typing 12/16/2017 B POSTIVE   Final   ??? Sodium 12/20/2017 137  mmol/L Final   ??? Potassium 12/20/2017 4.6  mmol/L Final   ??? Chloride 12/20/2017 103  mmol/L Final   ??? CO2 12/20/2017 21.4  mmol/L Final    LOW   ??? BUN 12/20/2017 34  mg/dL Final    HIGH   ??? Creatinine 12/20/2017 1.14  mg/dL Final   ??? Glucose 16/08/9603 113  mg/dL Final    HIGH   ??? Calcium 12/20/2017 9.4  mg/dL Final   ??? Total Protein 12/20/2017 6.4  g/dL Final   ??? Total Bilirubin 12/20/2017 1.0  mg/dL Final   ??? AST 54/07/8118 15  U/L Final   ??? ALT 12/20/2017 25  U/L Final   ??? Alkaline Phosphatase 12/20/2017 106  U/L Final    HIGH   ??? EGFR MDRD Non Af Amer 12/20/2017 60  mL/min/1.4m2 Final   ??? EGFR MDRD Af Amer 12/20/2017 60  mL/min/1.36m2 Final   ??? Albumin 12/20/2017 3.4   Final    LOW   ??? Anion Gap 12/20/2017 17   Final    HIGH   Lab on 12/15/2017   Component Date Value Ref Range Status   ??? WBC 12/15/2017 0.4  10*9/L Final    LOW   ??? RBC 12/15/2017 2.77  10*12/L Final    LOW   ??? HGB 12/15/2017 8.1  g/dL Final    LOW   ??? HCT 12/15/2017 25.4  % Final    LOW   ??? MCV 12/15/2017 91.7  fL Final   ??? MCH 12/15/2017 29.2  pg Final   ??? MCHC 12/15/2017 31.9  g/dL Final    LOW   ??? RDW 12/15/2017 13.2  % Final   ??? MPV 12/15/2017 12.8  fL Final    HIGH   ??? Platelet 12/15/2017 13  10*9/L Final    LOW   ??? nRBC 12/15/2017 0  /100 WBCs Final   ??? Neutrophils % 12/15/2017 2.7  % Final   ??? Lymphocytes % 12/15/2017 86.8  % Final   ??? Monocytes % 12/15/2017 10.5  % Final   ??? Eosinophils % 12/15/2017 0.0  % Final   ??? Basophils % 12/15/2017 0.0  % Final   ??? Absolute Neutrophils 12/15/2017 0.0  10*9/L Final    LOW   ??? Absolute Lymphocytes 12/15/2017 0.3  10*9/L Final    LOW   ??? Absolute Monocytes 12/15/2017 0.0  10*9/L Final    LOW   ???  Absolute Eosinophils 12/15/2017 0.0  10*9/L Final   ??? Absolute Basophils 12/15/2017 0.0  10*9/L Final   ??? Hypochromasia 12/15/2017 Marked* Not Present Final   ??? Lymphs 12/15/2017 88   Final    HIGH   ??? Monocytes Manual 12/15/2017 12   Final   ??? PLT Morphology 12/15/2017 test check   Final    DEC   ??? BNP 12/15/2017 255.1  pg/mL Final   ??? Sodium 12/15/2017 138  mmol/L Final   ??? Potassium 12/15/2017 3.8  mmol/L Final   ??? Chloride 12/15/2017 107  mmol/L Final   ??? CO2 12/15/2017 20.8  mmol/L Final    LOW   ??? BUN 12/15/2017 27  mg/dL Final    HIGH   ??? Creatinine 12/15/2017 0.84  mg/dL Final   ??? Glucose 32/44/0102 108  mg/dL Final    HIGH   ??? Calcium 12/15/2017 9.3  mg/dL Final   ??? Total Protein 12/15/2017 6.1  g/dL Final   ??? Total Bilirubin 12/15/2017 0.6  mg/dL Final ??? AST 72/53/6644 17  U/L Final   ??? ALT 12/15/2017 24  U/L Final   ??? Alkaline Phosphatase 12/15/2017 113  U/L Final    HIGH   ??? EGFR MDRD Non Af Amer 12/15/2017 60  mL/min/1.33m2 Final   ??? EGFR MDRD Af Amer 12/15/2017 60  mL/min/1.77m2 Final   ??? Albumin 12/15/2017 3.48   Final    LOW   ??? Anion Gap 12/15/2017 14   Final   Lab on 12/13/2017   Component Date Value Ref Range Status   ??? WBC 12/13/2017 0.6  10*9/L Final    LOW   ??? RBC 12/13/2017 2.95  10*12/L Final    LOW   ??? HGB 12/13/2017 8.8  g/dL Final    LOW   ??? HCT 12/13/2017 26.9  % Final    LOW   ??? MCV 12/13/2017 91.2  fL Final   ??? MCH 12/13/2017 29.8  pg Final   ??? MCHC 12/13/2017 32.7  g/dL Final   ??? RDW 03/47/4259 13.5  % Final   ??? MPV 12/13/2017 12.9  fL Final    HIGH   ??? Platelet 12/13/2017 20  10*9/L Final    LOW   ??? nRBC 12/13/2017 0  /100 WBCs Final   ??? Neutrophils % 12/13/2017 3.1  % Final   ??? Lymphocytes % 12/13/2017 79.4  % Final   ??? Monocytes % 12/13/2017 17.5  % Final   ??? Eosinophils % 12/13/2017 0.0  % Final   ??? Basophils % 12/13/2017 0.0  % Final   ??? Absolute Neutrophils 12/13/2017 0.0  10*9/L Final    LOW   ??? Absolute Lymphocytes 12/13/2017 0.5  10*9/L Final    LOW   ??? Absolute Monocytes 12/13/2017 0.1  10*9/L Final   ??? Absolute Eosinophils 12/13/2017 0.0  10*9/L Final   ??? Absolute Basophils 12/13/2017 0.0  10*9/L Final   ??? Hypochromasia 12/13/2017 Moderate* Not Present Final   ??? WBC Morphology 12/13/2017 test check   Final    MARKED DECREASE BUFFY COAT SMEAR PERFORMED ON PREVIOUS SPECIMEN   ??? PLT Morphology 12/13/2017 test check   Final    DEC   There may be more visits with results that are not included.        IMAGING STUDIES  No current imaging results    The total time spent discussing the previous history, current status of his disease and need to consider hospice care.  30 minutes was  spent in face-to-face time.  At least 50% of that time was spent in answering questions and counseling.    FOLLOW UP: AS DIRECTED       Ccc:

## 2018-01-07 NOTE — Unmapped (Signed)
Patient wife called and needs a letter written and signed by the physician faxed to [facility] at [phone] for the [facility] benefits process for the pateint.  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  If you have questions please contact Faye at [phone].

## 2018-01-10 DIAGNOSIS — Z6820 Body mass index (BMI) 20.0-20.9, adult: Secondary | ICD-10-CM | POA: Diagnosis not present

## 2018-01-10 DIAGNOSIS — C9200 Acute myeloblastic leukemia, not having achieved remission: Secondary | ICD-10-CM | POA: Diagnosis not present

## 2018-01-14 DEATH — deceased
